# Patient Record
Sex: Female | Born: 1940 | Race: White | Hispanic: No | Smoking: Former smoker
Health system: Southern US, Community
[De-identification: ages and names within clinical notes are randomized; demographics above are authoritative.]

## PROBLEM LIST (undated history)

## (undated) DIAGNOSIS — D649 Anemia, unspecified: Secondary | ICD-10-CM

## (undated) DIAGNOSIS — J449 Chronic obstructive pulmonary disease, unspecified: Secondary | ICD-10-CM

## (undated) DIAGNOSIS — M1990 Unspecified osteoarthritis, unspecified site: Secondary | ICD-10-CM

## (undated) DIAGNOSIS — I340 Nonrheumatic mitral (valve) insufficiency: Secondary | ICD-10-CM

## (undated) DIAGNOSIS — K219 Gastro-esophageal reflux disease without esophagitis: Secondary | ICD-10-CM

## (undated) DIAGNOSIS — R011 Cardiac murmur, unspecified: Secondary | ICD-10-CM

## (undated) DIAGNOSIS — J189 Pneumonia, unspecified organism: Secondary | ICD-10-CM

## (undated) DIAGNOSIS — I429 Cardiomyopathy, unspecified: Secondary | ICD-10-CM

## (undated) DIAGNOSIS — I10 Essential (primary) hypertension: Secondary | ICD-10-CM

## (undated) DIAGNOSIS — E039 Hypothyroidism, unspecified: Secondary | ICD-10-CM

## (undated) DIAGNOSIS — E785 Hyperlipidemia, unspecified: Secondary | ICD-10-CM

## (undated) HISTORY — PX: TONSILLECTOMY: SUR1361

## (undated) HISTORY — PX: APPENDECTOMY: SHX54

## (undated) HISTORY — PX: BREAST BIOPSY: SHX20

---

## 2004-02-09 ENCOUNTER — Emergency Department: Payer: Self-pay | Admitting: Emergency Medicine

## 2004-02-09 ENCOUNTER — Other Ambulatory Visit: Payer: Self-pay

## 2004-02-09 ENCOUNTER — Inpatient Hospital Stay: Payer: Self-pay | Admitting: Internal Medicine

## 2005-07-06 IMAGING — CT CT CHEST W/ CM
1 of 2 series · 16 of 32 positions shown, 20 images · IV contrast (APPLIED)
Comparison: none

REASON FOR EXAM: intubated,unresponsvie  cardiac
COMMENTS:

[Series 7: inspace · axial · 0.67mm/px · z∈[-320,-57]mm · 16 of 413 slices shown, 20 images]
[im 19/413  mediastinal]
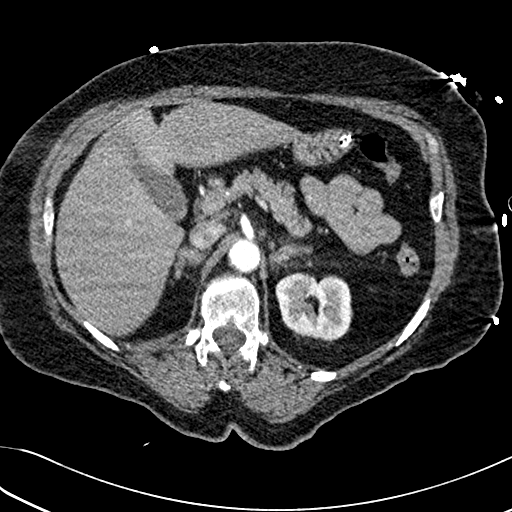
[im 19/413  lung]
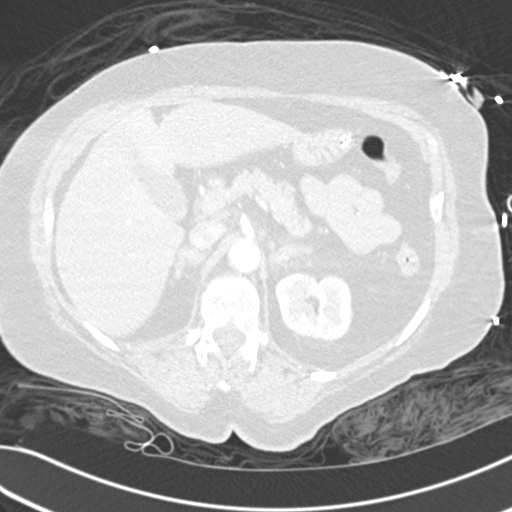
[im 57/413  lung]
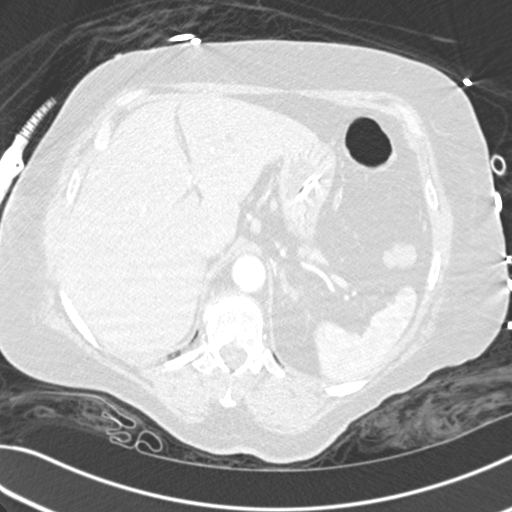
[im 75/413  lung]
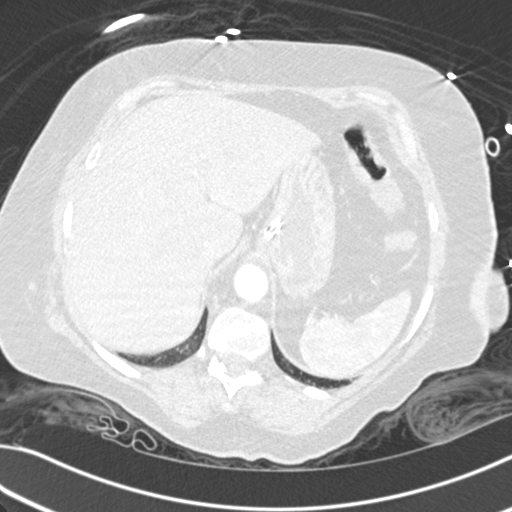
[im 104/413  lung]
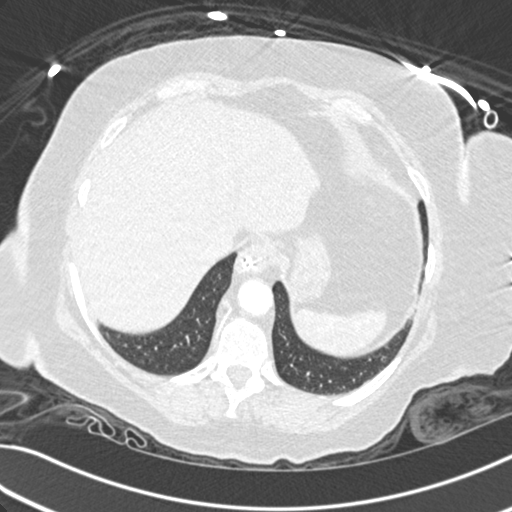
[im 113/413  mediastinal]
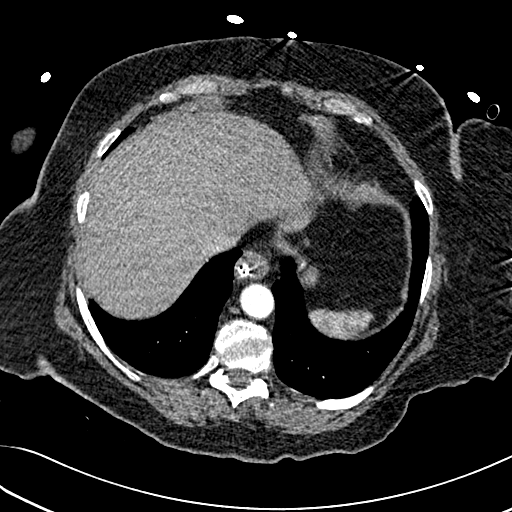
[im 113/413  lung]
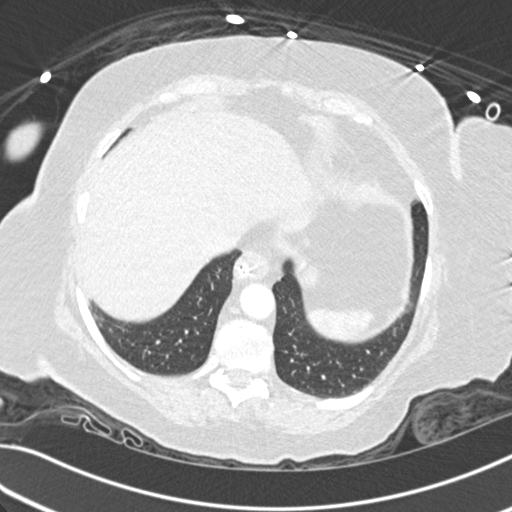
[im 150/413  lung]
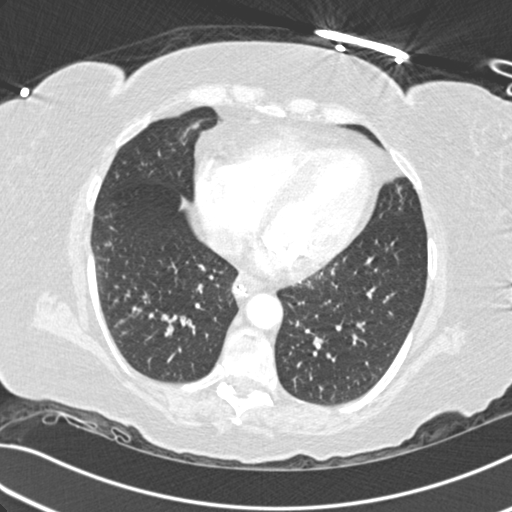
[im 169/413  lung]
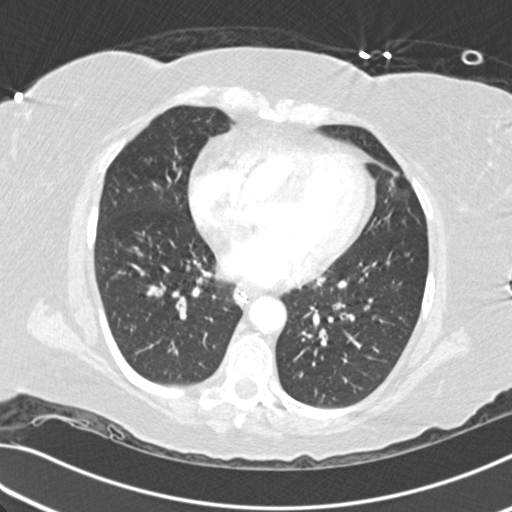
[im 193/413  lung]
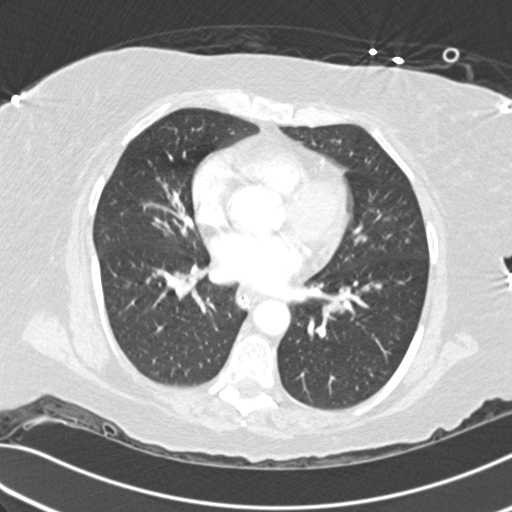
[im 207/413  mediastinal]
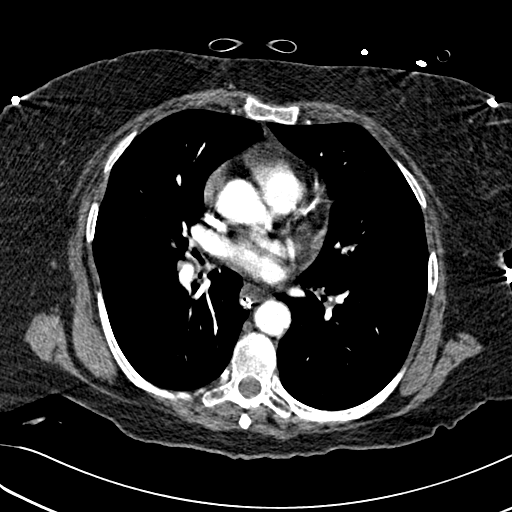
[im 207/413  lung]
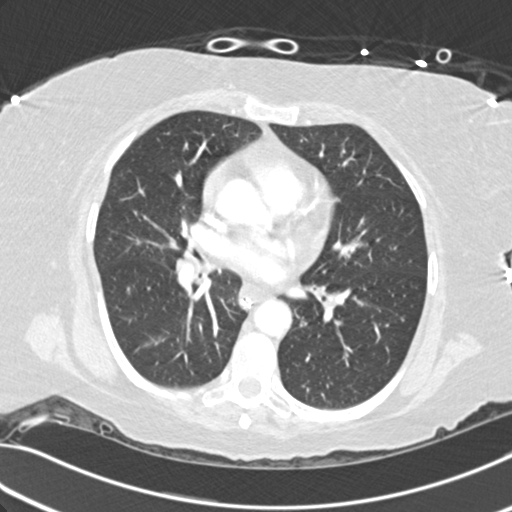
[im 244/413  lung]
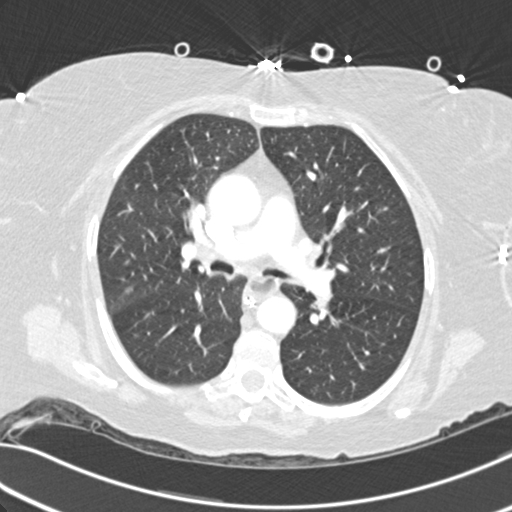
[im 263/413  lung]
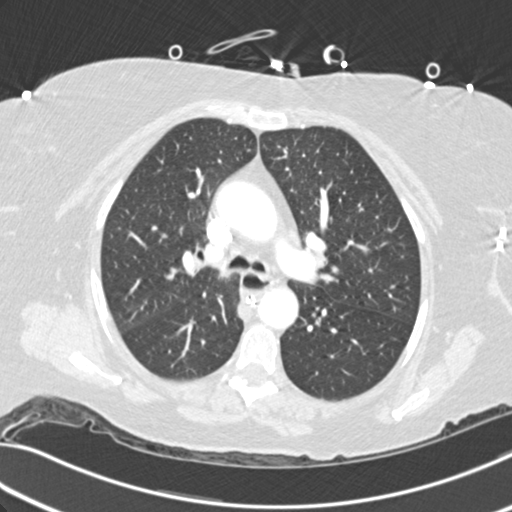
[im 300/413  lung]
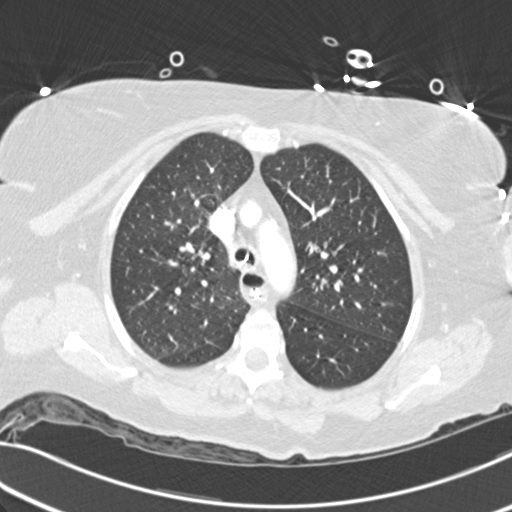
[im 310/413  mediastinal]
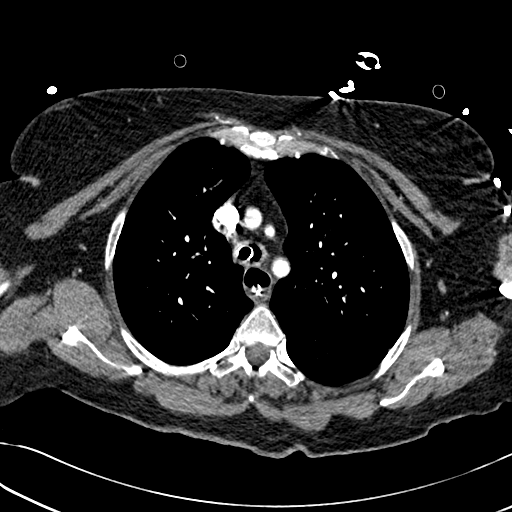
[im 310/413  lung]
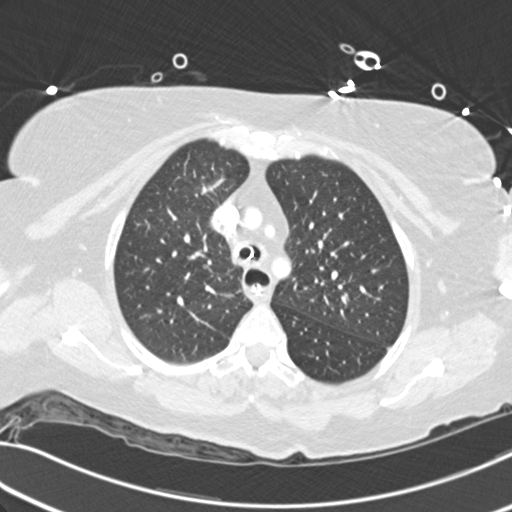
[im 338/413  lung]
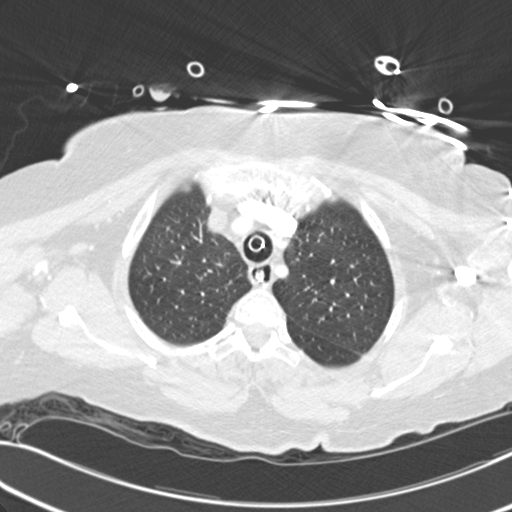
[im 356/413  lung]
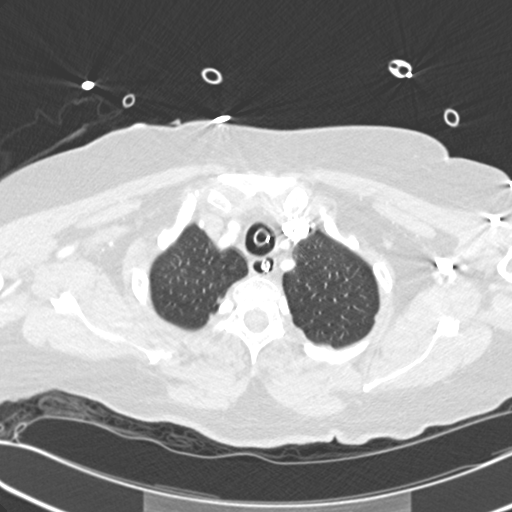
[im 394/413  lung]
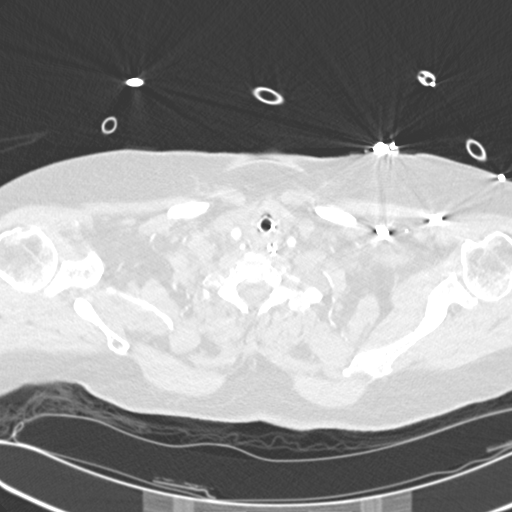

[16 of 32 positions shown; findings below may reference images not displayed]

PROCEDURE:     CT  - CT CHEST (FOR PE) W  - February 09, 2004  [DATE]

RESULT:     Standard IV contrast enhanced chest CT is obtained.

Endotracheal tube and NG tube is noted in good anatomic position. The
mediastinum and hilar structures are normal. The pulmonary arteries are
normal. The heart size is normal. Subsegmental atelectasis is noted in the
lung bases.
IMPRESSION: 1.     No evidence of pulmonary embolus.
2.     Endotracheal tube and NG tube noted in good anatomic position.
3.     No focal significant pulmonary parenchymal abnormalities are
identified.
4.     Subsegmental atelectasis noted in the lung bases.

## 2020-07-07 ENCOUNTER — Ambulatory Visit: Payer: Self-pay | Admitting: Student

## 2020-07-31 NOTE — Patient Instructions (Signed)
DUE TO COVID-19 ONLY ONE VISITOR IS ALLOWED TO COME WITH YOU AND STAY IN THE WAITING ROOM ONLY DURING PRE OP AND PROCEDURE DAY OF SURGERY. THE 1 VISITOR  MAY VISIT WITH YOU AFTER SURGERY IN YOUR PRIVATE ROOM DURING VISITING HOURS ONLY!  YOU NEED TO HAVE A COVID 19 TEST ON: 08/10/20, THIS TEST MUST BE DONE BEFORE SURGERY,  COVID TESTING SITE: [address]. Phone: [phone]. No appointment needed. Open M-F 8 am - 3 pm               ARLITA BUFFKIN   Your procedure is scheduled on: 08/13/20   Report to [facility] Main  Entrance   Report to short stay at : 5:15 AM     Call this number if you have problems the morning of surgery [phone]    Remember:NO SOLID FOOD AFTER MIDNIGHT THE NIGHT PRIOR TO SURGERY. NOTHING BY MOUTH EXCEPT CLEAR LIQUIDS UNTIL : 4:30 AM. PLEASE FINISH ENSURE DRINK PER SURGEON ORDER  WHICH NEEDS TO BE COMPLETED AT : 4:30 AM.   CLEAR LIQUID DIET  Foods Allowed                                                                     Foods Excluded  Coffee and tea, regular and decaf                             liquids that you cannot  Plain Jell-O any favor except red or purple                                           see through such as: Fruit ices (not with fruit pulp)                                     milk, soups, orange juice  Iced Popsicles                                    All solid food Carbonated beverages, regular and diet                                    Cranberry, grape and apple juices Sports drinks like Gatorade Lightly seasoned clear broth or consume(fat free) Sugar, honey syrup  Sample Menu Breakfast                                Lunch                                     Supper Cranberry juice                    Beef  broth                            Chicken broth Jell-O                                     Grape juice                           Apple juice Coffee or tea                        Jell-O                                       Popsicle                                                Coffee or tea                        Coffee or tea  _____________________________________________________________________   BRUSH YOUR TEETH MORNING OF SURGERY AND RINSE YOUR MOUTH OUT, NO CHEWING GUM CANDY OR MINTS.    Take these medicines the morning of surgery with A SIP OF WATER: cetirizine,synthroid. Use inhalers as needed.                               You may not have any metal on your body including hair pins and              piercings  Do not wear jewelry, make-up, lotions, powders or perfumes, deodorant             Do not wear nail polish on your fingernails.  Do not shave  48 hours prior to surgery.    Do not bring valuables to the hospital. [facility] IS NOT             RESPONSIBLE   FOR VALUABLES.  Contacts, dentures or bridgework may not be worn into surgery.  Leave suitcase in the car. After surgery it may be brought to your room.     Patients discharged the day of surgery will not be allowed to drive home. IF YOU ARE HAVING SURGERY AND GOING HOME THE SAME DAY, YOU MUST HAVE AN ADULT TO DRIVE YOU HOME AND BE WITH YOU FOR 24 HOURS. YOU MAY GO HOME BY TAXI OR UBER OR ORTHERWISE, BUT AN ADULT MUST ACCOMPANY YOU HOME AND STAY WITH YOU FOR 24 HOURS.  Name and phone number of your driver:  Special Instructions: N/A              Please read over the following fact sheets you were given: _____________________________________________________________________           [facility] - Preparing for Surgery Before surgery, you can play an important role.  Because skin is not sterile, your skin needs to be as free of germs as possible.  You can reduce the number of germs on your skin by washing with CHG (chlorahexidine gluconate) soap before surgery.  CHG is an antiseptic cleaner which kills germs and bonds with the skin to continue killing germs even after washing. Please DO NOT use if you have an allergy to CHG  or antibacterial soaps.  If your skin becomes reddened/irritated stop using the CHG and inform your nurse when you arrive at Short Stay. Do not shave (including legs and underarms) for at least 48 hours prior to the first CHG shower.  You may shave your face/neck. Please follow these instructions carefully:  1.  Shower with CHG Soap the night before surgery and the  morning of Surgery.  2.  If you choose to wash your hair, wash your hair first as usual with your  normal  shampoo.  3.  After you shampoo, rinse your hair and body thoroughly to remove the  shampoo.                           4.  Use CHG as you would any other liquid soap.  You can apply chg directly  to the skin and wash                       Gently with a scrungie or clean washcloth.  5.  Apply the CHG Soap to your body ONLY FROM THE NECK DOWN.   Do not use on face/ open                           Wound or open sores. Avoid contact with eyes, ears mouth and genitals (private parts).                       Wash face,  Genitals (private parts) with your normal soap.             6.  Wash thoroughly, paying special attention to the area where your surgery  will be performed.  7.  Thoroughly rinse your body with warm water from the neck down.  8.  DO NOT shower/wash with your normal soap after using and rinsing off  the CHG Soap.                9.  Pat yourself dry with a clean towel.            10.  Wear clean pajamas.            11.  Place clean sheets on your bed the night of your first shower and do not  sleep with pets. Day of Surgery : Do not apply any lotions/deodorants the morning of surgery.  Please wear clean clothes to the hospital/surgery center.  FAILURE TO FOLLOW THESE INSTRUCTIONS MAY RESULT IN THE CANCELLATION OF YOUR SURGERY PATIENT SIGNATURE_________________________________  NURSE SIGNATURE__________________________________  ________________________________________________________________________   Rogelia Mire  An incentive spirometer is a tool that can help keep your lungs clear and active. This tool measures how well you are filling your lungs with each breath. Taking long deep breaths may help reverse or decrease the chance of developing breathing (pulmonary) problems (especially infection) following: A long period of time when you are unable to move or be active. BEFORE THE PROCEDURE  If the spirometer includes an indicator to show your best effort, your nurse or respiratory therapist will set it to a desired goal. If possible, sit up straight or lean slightly forward. Try not to slouch. Hold the incentive  spirometer in an upright position. INSTRUCTIONS FOR USE  Sit on the edge of your bed if possible, or sit up as far as you can in bed or on a chair. Hold the incentive spirometer in an upright position. Breathe out normally. Place the mouthpiece in your mouth and seal your lips tightly around it. Breathe in slowly and as deeply as possible, raising the piston or the ball toward the top of the column. Hold your breath for 3-5 seconds or for as long as possible. Allow the piston or ball to fall to the bottom of the column. Remove the mouthpiece from your mouth and breathe out normally. Rest for a few seconds and repeat Steps 1 through 7 at least 10 times every 1-2 hours when you are awake. Take your time and take a few normal breaths between deep breaths. The spirometer may include an indicator to show your best effort. Use the indicator as a goal to work toward during each repetition. After each set of 10 deep breaths, practice coughing to be sure your lungs are clear. If you have an incision (the cut made at the time of surgery), support your incision when coughing by placing a pillow or rolled up towels firmly against it. Once you are able to get out of bed, walk around indoors and cough well. You may stop using the incentive spirometer when instructed by your caregiver.  RISKS AND  COMPLICATIONS Take your time so you do not get dizzy or light-headed. If you are in pain, you may need to take or ask for pain medication before doing incentive spirometry. It is harder to take a deep breath if you are having pain. AFTER USE Rest and breathe slowly and easily. It can be helpful to keep track of a log of your progress. Your caregiver can provide you with a simple table to help with this. If you are using the spirometer at home, follow these instructions: SEEK MEDICAL CARE IF:  You are having difficultly using the spirometer. You have trouble using the spirometer as often as instructed. Your pain medication is not giving enough relief while using the spirometer. You develop fever of 100.5 F (38.1 C) or higher. SEEK IMMEDIATE MEDICAL CARE IF:  You cough up bloody sputum that had not been present before. You develop fever of 102 F (38.9 C) or greater. You develop worsening pain at or near the incision site. MAKE SURE YOU:  Understand these instructions. Will watch your condition. Will get help right away if you are not doing well or get worse. Document Released: 05/02/2006 Document Revised: 03/14/2011 Document Reviewed: 07/03/2006 [facility] Patient Information 2014 [place], [place].   ________________________________________________________________________

## 2020-08-04 ENCOUNTER — Encounter (HOSPITAL_COMMUNITY)
Admission: RE | Admit: 2020-08-04 | Discharge: 2020-08-04 | Disposition: A | Discharge status: Home / Self Care | Payer: Medicare PPO | Source: Ambulatory Visit | Attending: Anesthesiology | Admitting: Anesthesiology

## 2020-08-13 ENCOUNTER — Ambulatory Visit (HOSPITAL_COMMUNITY): Admission: RE | Admit: 2020-08-13 | Payer: Medicare PPO | Source: Home / Self Care | Admitting: Orthopedic Surgery

## 2020-08-13 ENCOUNTER — Encounter (HOSPITAL_COMMUNITY): Admission: RE | Payer: Self-pay | Source: Home / Self Care

## 2020-08-13 SURGERY — ARTHROPLASTY, HIP, TOTAL, ANTERIOR APPROACH
Anesthesia: Spinal | Site: Hip | Laterality: Left

## 2020-11-02 ENCOUNTER — Ambulatory Visit: Payer: Self-pay | Admitting: Student

## 2020-11-02 DIAGNOSIS — M1612 Unilateral primary osteoarthritis, left hip: Secondary | ICD-10-CM

## 2020-11-17 ENCOUNTER — Ambulatory Visit: Payer: Self-pay | Admitting: Student

## 2020-11-17 ENCOUNTER — Encounter (HOSPITAL_COMMUNITY): Payer: Self-pay

## 2020-11-17 NOTE — Progress Notes (Addendum)
COVID swab appointment:  12-01-20 at [facility]  COVID Vaccine Completed:  Yes x2 Date COVID Vaccine completed:  01-28-19 02-18-19 Has received booster: Yes x2 10-24-19 10-06-20 COVID vaccine manufacturer: Pfizer      Date of COVID positive in last 90 days: No  PCP - Sylvan Cheese, MD  in [place] (notes requested) Cardiologist - Cletis Athens, MD (notes [facility])  Cardiac clearance in [facility] in note dated 09-09-20 by Dr. Arcola Jansky (on chart)    Medical Clearance on chart dated 10-28-20 by Dr. Sylvan Cheese  Chest x-ray - at Dr. Yetta Barre office  EKG - 07-01-20 [facility].  On chart Stress Test - N/A ECHO - 08-24-20 [facility] & on chart Cardiac Cath - N/A Pacemaker/ICD device last checked: Spinal Cord Stimulator:  Sleep Study - N/A CPAP -   Fasting Blood Sugar - N/A Checks Blood Sugar _____ times a day  Blood Thinner Instructions: N/A Aspirin Instructions: Last Dose:  Activity level:  Can go up a flight of stairs and perform activities of daily living without stopping and without symptoms of chest pain or shortness of breath.    Anesthesia review:  Cardiomyopathy, mitral valve insufficiency, murmur, HTN, COPD  BUN 59 and creatinine 1.65 on PAT labs  Patient denies shortness of breath, fever, cough and chest pain at PAT appointment   Patient verbalized understanding of instructions that were given to them at the PAT appointment. Patient was also instructed that they will need to review over the PAT instructions again at home before surgery.

## 2020-11-17 NOTE — H&P (Signed)
TOTAL HIP ADMISSION H&P  Patient is admitted for left total hip arthroplasty.  Subjective:  Chief Complaint: left hip pain  HPI: Eileen Wright, 80 y.o. female, has a history of pain and functional disability in the left hip(s) due to arthritis and patient has failed non-surgical conservative treatments for greater than 12 weeks to include NSAID's and/or analgesics and activity modification.  Onset of symptoms was gradual starting 3 years ago with gradually worsening course since that time.The patient noted no past surgery on the left hip(s).  Patient currently rates pain in the left hip at 9 out of 10 with activity. Patient has worsening of pain with activity and weight bearing, pain that interfers with activities of daily living, and pain with passive range of motion. Patient has evidence of subchondral cysts, subchondral sclerosis, and joint space narrowing by imaging studies. This condition presents safety issues increasing the risk of falls. There is no current active infection.  There are no problems to display for this patient.  Past Medical History:  Diagnosis Date   Anemia    Arthritis    Cardiomyopathy ([place])    COPD (chronic obstructive pulmonary disease) (HCC)    GERD (gastroesophageal reflux disease)    Heart murmur    Hyperlipemia    Hypertension    Hypothyroidism    Mitral valve insufficiency    Pneumonia     Past Surgical History:  Procedure Laterality Date   APPENDECTOMY     BREAST BIOPSY     TONSILLECTOMY     adenoids    No current facility-administered medications for this visit.   No current outpatient medications on file.   Facility-Administered Medications Ordered in Other Visits  Medication Dose Route Frequency Provider Last Rate Last Admin   0.9 %  sodium chloride infusion   Intravenous Continuous Cherlynn June B, PA       acetaminophen (OFIRMEV) IV 1,000 mg  1,000 mg Intravenous Once Yomira Flitton B, PA       acetaminophen (TYLENOL) tablet 1,000 mg   1,000 mg Oral Once Duane Boston, MD       ceFAZolin (ANCEF) IVPB 2g/100 mL premix  2 g Intravenous On Call to OR Cherlynn June B, PA       lactated ringers infusion   Intravenous Continuous Nolon Nations, MD 10 mL/hr at 12/03/20 0626 New Bag at 12/03/20 0626   povidone-iodine 10 % swab 2 application  2 application Topical Once Kiev Labrosse B, PA       tranexamic acid (CYKLOKAPRON) IVPB 1,000 mg  1,000 mg Intravenous To OR Cherlynn June B, PA       Allergies  Allergen Reactions   Epinephrine Hives   Vancomycin Hives   Penicillins     Redness around injection site    Social History   Tobacco Use   Smoking status: Former    Years: 1.00    Types: Cigarettes   Smokeless tobacco: Never  Substance Use Topics   Alcohol use: Never    No family history on file.   Review of Systems  Musculoskeletal:  Positive for arthralgias.  All other systems reviewed and are negative.  Objective:  Physical Exam HENT:     Head: Normocephalic.  Eyes:     Pupils: Pupils are equal, round, and reactive to light.  Cardiovascular:     Rate and Rhythm: Normal rate.  Pulmonary:     Effort: Pulmonary effort is normal.  Abdominal:     Palpations: Abdomen is soft.  Genitourinary:  Comments: Deferred Musculoskeletal:     Cervical back: Normal range of motion.     Comments: Painful ROM L hip  Skin:    General: Skin is warm.  Neurological:     Mental Status: She is alert and oriented to person, place, and time.  Psychiatric:        Behavior: Behavior normal.    Vital signs in last 24 hours: @VSRANGES @  Labs:   Estimated body mass index is 29.93 kg/m as calculated from the following:   Height as of 12/03/20: 5\' 1"  (1.549 m).   Weight as of 12/03/20: 71.9 kg.   Imaging Review Plain radiographs demonstrate severe degenerative joint disease of the left hip(s). The bone quality appears to be adequate for age and reported activity level.      Assessment/Plan:  End stage  arthritis, left hip(s)  The patient history, physical examination, clinical judgement of the provider and imaging studies are consistent with end stage degenerative joint disease of the left hip(s) and total hip arthroplasty is deemed medically necessary. The treatment options including medical management, injection therapy, arthroscopy and arthroplasty were discussed at length. The risks and benefits of total hip arthroplasty were presented and reviewed. The risks due to aseptic loosening, infection, stiffness, dislocation/subluxation,  thromboembolic complications and other imponderables were discussed.  The patient acknowledged the explanation, agreed to proceed with the plan and consent was signed. Patient is being admitted for inpatient treatment for surgery, pain control, PT, OT, prophylactic antibiotics, VTE prophylaxis, progressive ambulation and ADL's and discharge planning.The patient is planning to be discharged  home after overnight observation.

## 2020-11-17 NOTE — Patient Instructions (Addendum)
DUE TO COVID-19 ONLY ONE VISITOR IS ALLOWED TO COME WITH YOU AND STAY IN THE WAITING ROOM ONLY DURING PRE OP AND PROCEDURE.   **NO VISITORS ARE ALLOWED IN THE SHORT STAY AREA OR RECOVERY ROOM!!**  IF YOU WILL BE ADMITTED INTO THE HOSPITAL YOU ARE ALLOWED ONLY TWO SUPPORT PEOPLE DURING VISITATION HOURS ONLY (7 AM -8PM)    Up to two visitors ages 41+ are allowed at one time in a patient's room.  The visitors may rotate out with other people throughout the day.  Additionally, up to two children between the ages of 36 and 87 are allowed and do not count toward the number of allowed visitors.  Children within this age range must be accompanied by an adult visitor.  One adult visitor may remain with the patient overnight and must be in the room by 8 PM.  COVID SWAB TESTING MUST BE COMPLETED ON:  Tuesday, 12-01-20 at 8:05  **MUST PRESENT COMPLETED FORM AT TESTING SITE**    [address] [place], [place].  [facility] [address] Preadmit Testing  You are not required to quarantine, however you are required to wear a well-fitted mask when you are out and around people not in your household.  Hand Hygiene often Do NOT share personal items Notify your provider if you are in close contact with someone who has COVID or you develop fever 100.4 or greater, new onset of sneezing, cough, sore throat, shortness of breath or body aches.        Your procedure is scheduled on:  Thursday, 12-03-20   Report to [facility] Main  Entrance     Report to admitting at 5:15 AM   Call this number if you have problems the morning of surgery [phone]   Do not eat food :After Midnight.   May have liquids until 4:30 AM day of surgery  CLEAR LIQUID DIET  Foods Allowed                                                                     Foods Excluded  Water, Black Coffee (no milk/no creamer) and tea, regular and decaf                              liquids that you cannot  Plain Jell-O in  any flavor  (No red)                         see through such as: Fruit ices (not with fruit pulp)                                 milk, soups, orange juice  Iced Popsicles (No red)                                    All solid food                             Apple juices Sports  drinks like Gatorade (No red) Lightly seasoned clear broth or consume(fat free) Sugar    Complete one Ensure drink the morning of surgery at 4:30 AM the day of surgery.     The day of surgery:  Drink ONE (1) Pre-Surgery Clear Ensure the morning of surgery. Drink in one sitting. Do not sip.  This drink was given to you during your hospital  pre-op appointment visit. Nothing else to drink after completing the Pre-Surgery Clear Ensure.          If you have questions, please contact your surgeon's office.     Oral Hygiene is also important to reduce your risk of infection.                                    Remember - BRUSH YOUR TEETH THE MORNING OF SURGERY WITH YOUR REGULAR TOOTHPASTE   Do NOT smoke after Midnight   Take these medicines the morning of surgery with A SIP OF WATER:  Atorvastatin, Zyrtec, Synthroid.  Okay to use inhalers and nebulizers   Stop all vitamins and herbal supplements a week before surgery              You may not have any metal on your body including hair pins, jewelry, and body piercing             Do not wear make-up, lotions, powders, perfumes or deodorant  Do not wear nail polish including gel and S&S, artificial/acrylic nails, or any other type of covering on natural nails including finger and toenails. If you have artificial nails, gel coating, etc. that needs to be removed by a nail salon please have this removed prior to surgery or surgery may need to be canceled/ delayed if the surgeon/ anesthesia feels like they are unable to be safely monitored.   Do not shave  48 hours prior to surgery.   Do not bring valuables to the hospital. [facility] IS NOT RESPONSIBLE FOR  VALUABLES.   Contacts, dentures or bridgework may not be worn into surgery.   Bring small overnight bag day of surgery.   Please read over the following fact sheets you were given: IF YOU HAVE QUESTIONS ABOUT YOUR PRE OP INSTRUCTIONS PLEASE CALL [phone] [facility] - Preparing for Surgery Before surgery, you can play an important role.  Because skin is not sterile, your skin needs to be as free of germs as possible.  You can reduce the number of germs on your skin by washing with CHG (chlorahexidine gluconate) soap before surgery.  CHG is an antiseptic cleaner which kills germs and bonds with the skin to continue killing germs even after washing. Please DO NOT use if you have an allergy to CHG or antibacterial soaps.  If your skin becomes reddened/irritated stop using the CHG and inform your nurse when you arrive at Short Stay. Do not shave (including legs and underarms) for at least 48 hours prior to the first CHG shower.  You may shave your face/neck.  Please follow these instructions carefully:  1.  Shower with CHG Soap the night before surgery and the  morning of surgery.  2.  If you choose to wash your hair, wash your hair first as usual with your normal  shampoo.  3.  After you shampoo, rinse your hair and body thoroughly to remove the shampoo.  4.  Use CHG as you would any other liquid soap.  You can apply chg directly to the skin and wash.  Gently with a scrungie or clean washcloth.  5.  Apply the CHG Soap to your body ONLY FROM THE NECK DOWN.   Do   not use on face/ open                           Wound or open sores. Avoid contact with eyes, ears mouth and   genitals (private parts).                       Wash face,  Genitals (private parts) with your normal soap.             6.  Wash thoroughly, paying special attention to the area where your    surgery  will be performed.  7.  Thoroughly rinse your body with warm water from the neck down.  8.   DO NOT shower/wash with your normal soap after using and rinsing off the CHG Soap.                9.  Pat yourself dry with a clean towel.            10.  Wear clean pajamas.            11.  Place clean sheets on your bed the night of your first shower and do not  sleep with pets. Day of Surgery : Do not apply any lotions/deodorants the morning of surgery.  Please wear clean clothes to the hospital/surgery center.  FAILURE TO FOLLOW THESE INSTRUCTIONS MAY RESULT IN THE CANCELLATION OF YOUR SURGERY  PATIENT SIGNATURE_________________________________  NURSE SIGNATURE__________________________________  ________________________________________________________________________   Eileen Wright  An incentive spirometer is a tool that can help keep your lungs clear and active. This tool measures how well you are filling your lungs with each breath. Taking long deep breaths may help reverse or decrease the chance of developing breathing (pulmonary) problems (especially infection) following: A long period of time when you are unable to move or be active. BEFORE THE PROCEDURE  If the spirometer includes an indicator to show your best effort, your nurse or respiratory therapist will set it to a desired goal. If possible, sit up straight or lean slightly forward. Try not to slouch. Hold the incentive spirometer in an upright position. INSTRUCTIONS FOR USE  Sit on the edge of your bed if possible, or sit up as far as you can in bed or on a chair. Hold the incentive spirometer in an upright position. Breathe out normally. Place the mouthpiece in your mouth and seal your lips tightly around it. Breathe in slowly and as deeply as possible, raising the piston or the ball toward the top of the column. Hold your breath for 3-5 seconds or for as long as possible. Allow the piston or ball to fall to the bottom of the column. Remove the mouthpiece from your mouth and breathe out normally. Rest for a  few seconds and repeat Steps 1 through 7 at least 10 times every 1-2 hours when you are awake. Take your time and take a few normal breaths between deep breaths. The spirometer may include an indicator to show your best effort. Use the indicator as a goal to work toward during each repetition. After each set of 10 deep breaths, practice coughing to be sure  your lungs are clear. If you have an incision (the cut made at the time of surgery), support your incision when coughing by placing a pillow or rolled up towels firmly against it. Once you are able to get out of bed, walk around indoors and cough well. You may stop using the incentive spirometer when instructed by your caregiver.  RISKS AND COMPLICATIONS Take your time so you do not get dizzy or light-headed. If you are in pain, you may need to take or ask for pain medication before doing incentive spirometry. It is harder to take a deep breath if you are having pain. AFTER USE Rest and breathe slowly and easily. It can be helpful to keep track of a log of your progress. Your caregiver can provide you with a simple table to help with this. If you are using the spirometer at home, follow these instructions: [place] IF:  You are having difficultly using the spirometer. You have trouble using the spirometer as often as instructed. Your pain medication is not giving enough relief while using the spirometer. You develop fever of 100.5 F (38.1 C) or higher. SEEK IMMEDIATE MEDICAL CARE IF:  You cough up bloody sputum that had not been present before. You develop fever of 102 F (38.9 C) or greater. You develop worsening pain at or near the incision site. MAKE SURE YOU:  Understand these instructions. Will watch your condition. Will get help right away if you are not doing well or get worse. Document Released: 05/02/2006 Document Revised: 03/14/2011 Document Reviewed: 07/03/2006 Exit[employer] Patient Information 2014 [employer],  [place].   ________________________________________________________________________  WHAT IS A BLOOD TRANSFUSION? Blood Transfusion Information  A transfusion is the replacement of blood or some of its parts. Blood is made up of multiple cells which provide different functions. Red blood cells carry oxygen and are used for blood loss replacement. White blood cells fight against infection. Platelets control bleeding. Plasma helps clot blood. Other blood products are available for specialized needs, such as hemophilia or other clotting disorders. BEFORE THE TRANSFUSION  Who gives blood for transfusions?  Healthy volunteers who are fully evaluated to make sure their blood is safe. This is blood bank blood. Transfusion therapy is the safest it has ever been in the practice of medicine. Before blood is taken from a donor, a complete history is taken to make sure that person has no history of diseases nor engages in risky social behavior (examples are intravenous drug use or sexual activity with multiple partners). The donor's travel history is screened to minimize risk of transmitting infections, such as malaria. The donated blood is tested for signs of infectious diseases, such as HIV and hepatitis. The blood is then tested to be sure it is compatible with you in order to minimize the chance of a transfusion reaction. If you or a relative donates blood, this is often done in anticipation of surgery and is not appropriate for emergency situations. It takes many days to process the donated blood. RISKS AND COMPLICATIONS Although transfusion therapy is very safe and saves many lives, the main dangers of transfusion include:  Getting an infectious disease. Developing a transfusion reaction. This is an allergic reaction to something in the blood you were given. Every precaution is taken to prevent this. The decision to have a blood transfusion has been considered carefully by your caregiver before blood is  given. Blood is not given unless the benefits outweigh the risks. AFTER THE TRANSFUSION Right after receiving a blood  transfusion, you will usually feel much better and more energetic. This is especially true if your red blood cells have gotten low (anemic). The transfusion raises the level of the red blood cells which carry oxygen, and this usually causes an energy increase. The nurse administering the transfusion will monitor you carefully for complications. HOME CARE INSTRUCTIONS  No special instructions are needed after a transfusion. You may find your energy is better. Speak with your caregiver about any limitations on activity for underlying diseases you may have. SEEK MEDICAL CARE IF:  Your condition is not improving after your transfusion. You develop redness or irritation at the intravenous (IV) site. SEEK IMMEDIATE MEDICAL CARE IF:  Any of the following symptoms occur over the next 12 hours: Shaking chills. You have a temperature by mouth above 102 F (38.9 C), not controlled by medicine. Chest, back, or muscle pain. People around you feel you are not acting correctly or are confused. Shortness of breath or difficulty breathing. Dizziness and fainting. You get a rash or develop hives. You have a decrease in urine output. Your urine turns a dark color or changes to pink, red, or brown. Any of the following symptoms occur over the next 10 days: You have a temperature by mouth above 102 F (38.9 C), not controlled by medicine. Shortness of breath. Weakness after normal activity. The white part of the eye turns yellow (jaundice). You have a decrease in the amount of urine or are urinating less often. Your urine turns a dark color or changes to pink, red, or brown. Document Released: 12/18/1999 Document Revised: 03/14/2011 Document Reviewed: 08/06/2007 [facility] Patient Information 2014 [place], [place].  _______________________________________________________________________

## 2020-11-17 NOTE — H&P (View-Only) (Signed)
TOTAL HIP ADMISSION H&P  Patient is admitted for left total hip arthroplasty.  Subjective:  Chief Complaint: left hip pain  HPI: Eileen Wright, 80 y.o. female, has a history of pain and functional disability in the left hip(s) due to arthritis and patient has failed non-surgical conservative treatments for greater than 12 weeks to include NSAID's and/or analgesics and activity modification.  Onset of symptoms was gradual starting 3 years ago with gradually worsening course since that time.The patient noted no past surgery on the left hip(s).  Patient currently rates pain in the left hip at 9 out of 10 with activity. Patient has worsening of pain with activity and weight bearing, pain that interfers with activities of daily living, and pain with passive range of motion. Patient has evidence of subchondral cysts, subchondral sclerosis, and joint space narrowing by imaging studies. This condition presents safety issues increasing the risk of falls. There is no current active infection.  There are no problems to display for this patient.  Past Medical History:  Diagnosis Date   Anemia    Arthritis    Cardiomyopathy ([place])    COPD (chronic obstructive pulmonary disease) (HCC)    GERD (gastroesophageal reflux disease)    Heart murmur    Hyperlipemia    Hypertension    Hypothyroidism    Mitral valve insufficiency    Pneumonia     Past Surgical History:  Procedure Laterality Date   APPENDECTOMY     BREAST BIOPSY     TONSILLECTOMY     adenoids    No current facility-administered medications for this visit.   No current outpatient medications on file.   Facility-Administered Medications Ordered in Other Visits  Medication Dose Route Frequency Provider Last Rate Last Admin   0.9 %  sodium chloride infusion   Intravenous Continuous Cherlynn June B, PA       acetaminophen (OFIRMEV) IV 1,000 mg  1,000 mg Intravenous Once Zissel Biederman B, PA       acetaminophen (TYLENOL) tablet 1,000 mg   1,000 mg Oral Once Duane Boston, MD       ceFAZolin (ANCEF) IVPB 2g/100 mL premix  2 g Intravenous On Call to OR Cherlynn June B, PA       lactated ringers infusion   Intravenous Continuous Nolon Nations, MD 10 mL/hr at 12/03/20 0626 New Bag at 12/03/20 0626   povidone-iodine 10 % swab 2 application  2 application Topical Once Aletheia Tangredi B, PA       tranexamic acid (CYKLOKAPRON) IVPB 1,000 mg  1,000 mg Intravenous To OR Cherlynn June B, PA       Allergies  Allergen Reactions   Epinephrine Hives   Vancomycin Hives   Penicillins     Redness around injection site    Social History   Tobacco Use   Smoking status: Former    Years: 1.00    Types: Cigarettes   Smokeless tobacco: Never  Substance Use Topics   Alcohol use: Never    No family history on file.   Review of Systems  Musculoskeletal:  Positive for arthralgias.  All other systems reviewed and are negative.  Objective:  Physical Exam HENT:     Head: Normocephalic.  Eyes:     Pupils: Pupils are equal, round, and reactive to light.  Cardiovascular:     Rate and Rhythm: Normal rate.  Pulmonary:     Effort: Pulmonary effort is normal.  Abdominal:     Palpations: Abdomen is soft.  Genitourinary:  Comments: Deferred Musculoskeletal:     Cervical back: Normal range of motion.     Comments: Painful ROM L hip  Skin:    General: Skin is warm.  Neurological:     Mental Status: She is alert and oriented to person, place, and time.  Psychiatric:        Behavior: Behavior normal.    Vital signs in last 24 hours: @VSRANGES @  Labs:   Estimated body mass index is 29.93 kg/m as calculated from the following:   Height as of 12/03/20: 5\' 1"  (1.549 m).   Weight as of 12/03/20: 71.9 kg.   Imaging Review Plain radiographs demonstrate severe degenerative joint disease of the left hip(s). The bone quality appears to be adequate for age and reported activity level.      Assessment/Plan:  End stage  arthritis, left hip(s)  The patient history, physical examination, clinical judgement of the provider and imaging studies are consistent with end stage degenerative joint disease of the left hip(s) and total hip arthroplasty is deemed medically necessary. The treatment options including medical management, injection therapy, arthroscopy and arthroplasty were discussed at length. The risks and benefits of total hip arthroplasty were presented and reviewed. The risks due to aseptic loosening, infection, stiffness, dislocation/subluxation,  thromboembolic complications and other imponderables were discussed.  The patient acknowledged the explanation, agreed to proceed with the plan and consent was signed. Patient is being admitted for inpatient treatment for surgery, pain control, PT, OT, prophylactic antibiotics, VTE prophylaxis, progressive ambulation and ADL's and discharge planning.The patient is planning to be discharged  home after overnight observation.

## 2020-11-23 ENCOUNTER — Encounter (HOSPITAL_COMMUNITY)
Admission: RE | Admit: 2020-11-23 | Discharge: 2020-11-23 | Disposition: A | Discharge status: Home / Self Care | Payer: Medicare PPO | Source: Ambulatory Visit | Attending: Orthopedic Surgery | Admitting: Orthopedic Surgery

## 2020-11-23 ENCOUNTER — Other Ambulatory Visit: Payer: Self-pay

## 2020-11-23 ENCOUNTER — Encounter (HOSPITAL_COMMUNITY): Payer: Self-pay | Admitting: *Deleted

## 2020-11-23 VITALS — BP 147/63 | HR 88 | Temp 97.9°F | Resp 18 | Ht 61.0 in | Wt 158.4 lb

## 2020-11-23 DIAGNOSIS — Z01818 Encounter for other preprocedural examination: Secondary | ICD-10-CM

## 2020-11-23 DIAGNOSIS — Z01812 Encounter for preprocedural laboratory examination: Secondary | ICD-10-CM | POA: Insufficient documentation

## 2020-11-23 DIAGNOSIS — M1612 Unilateral primary osteoarthritis, left hip: Secondary | ICD-10-CM

## 2020-11-23 HISTORY — DX: Unspecified osteoarthritis, unspecified site: M19.90

## 2020-11-23 HISTORY — DX: Gastro-esophageal reflux disease without esophagitis: K21.9

## 2020-11-23 HISTORY — DX: Cardiomyopathy, unspecified: I42.9

## 2020-11-23 HISTORY — DX: Pneumonia, unspecified organism: J18.9

## 2020-11-23 HISTORY — DX: Hyperlipidemia, unspecified: E78.5

## 2020-11-23 HISTORY — DX: Nonrheumatic mitral (valve) insufficiency: I34.0

## 2020-11-23 HISTORY — DX: Essential (primary) hypertension: I10

## 2020-11-23 HISTORY — DX: Hypothyroidism, unspecified: E03.9

## 2020-11-23 HISTORY — DX: Cardiac murmur, unspecified: R01.1

## 2020-11-23 HISTORY — DX: Chronic obstructive pulmonary disease, unspecified: J44.9

## 2020-11-23 HISTORY — DX: Anemia, unspecified: D64.9

## 2020-11-23 LAB — CBC
HCT: 32 % — ABNORMAL LOW (ref 36.0–46.0)
Hemoglobin: 10.3 g/dL — ABNORMAL LOW (ref 12.0–15.0)
MCH: 31.8 pg (ref 26.0–34.0)
MCHC: 32.2 g/dL (ref 30.0–36.0)
MCV: 98.8 fL (ref 80.0–100.0)
Platelets: 369 10*3/uL (ref 150–400)
RBC: 3.24 MIL/uL — ABNORMAL LOW (ref 3.87–5.11)
RDW: 13.1 % (ref 11.5–15.5)
WBC: 9.3 10*3/uL (ref 4.0–10.5)
nRBC: 0 % (ref 0.0–0.2)

## 2020-11-23 LAB — COMPREHENSIVE METABOLIC PANEL
ALT: 12 U/L (ref 0–44)
AST: 15 U/L (ref 15–41)
Albumin: 4.2 g/dL (ref 3.5–5.0)
Alkaline Phosphatase: 61 U/L (ref 38–126)
Anion gap: 7 (ref 5–15)
BUN: 59 mg/dL — ABNORMAL HIGH (ref 8–23)
CO2: 20 mmol/L — ABNORMAL LOW (ref 22–32)
Calcium: 9.8 mg/dL (ref 8.9–10.3)
Chloride: 113 mmol/L — ABNORMAL HIGH (ref 98–111)
Creatinine, Ser: 1.65 mg/dL — ABNORMAL HIGH (ref 0.44–1.00)
GFR, Estimated: 31 mL/min — ABNORMAL LOW (ref >60)
Glucose, Bld: 101 mg/dL — ABNORMAL HIGH (ref 70–99)
Potassium: 4.8 mmol/L (ref 3.5–5.1)
Sodium: 140 mmol/L (ref 135–145)
Total Bilirubin: 0.5 mg/dL (ref 0.3–1.2)
Total Protein: 7.1 g/dL (ref 6.5–8.1)

## 2020-11-23 LAB — PROTIME-INR
INR: 1 (ref 0.8–1.2)
Prothrombin Time: 13.4 seconds (ref 11.4–15.2)

## 2020-11-23 LAB — SURGICAL PCR SCREEN
MRSA, PCR: NEGATIVE
Staphylococcus aureus: NEGATIVE

## 2020-11-25 NOTE — Anesthesia Preprocedure Evaluation (Addendum)
Anesthesia Evaluation    Reviewed: Allergy & Precautions, Patient's Chart, lab work & pertinent test results, reviewed documented beta blocker date and time   History of Anesthesia Complications Negative for: history of anesthetic complications  Airway Mallampati: II  TM Distance: >3 FB Neck ROM: Full    Dental  (+) Poor Dentition, Dental Advisory Given   Pulmonary COPD,  COPD inhaler, former smoker,    Pulmonary exam normal        Cardiovascular hypertension, Pt. on medications and Pt. on home beta blockers Normal cardiovascular exam+ Valvular Problems/Murmurs MR   Pt last seen by cardiology 09/09/2020. Per OV note, "The patient has good control of risk factors and denies anginal symptoms. Patient is stable from a cardiac perspective and at acceptable risk for anticipated hip replacement surgery."  Echo 08/24/20 Summary  1. 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  2. The left ventricle is relatively small in size with moderately to  severely increased wall thickness.  3. The left ventricular systolic function is hyperdynamic, LVEF is visually  estimated at >70%.  4. Mitral annular calcification is present (severe).  5. The mitral valve leaflets are mildly thickened with normal leaflet  mobility.  6. There is moderate mitral valve regurgitation.  7. The aortic valve is trileaflet with mildly thickened leaflets with normal  excursion.  8. The left atrium is dilated in size.  9. The right ventricle is normal in size, with normal systolic function.    Neuro/Psych negative neurological ROS     GI/Hepatic negative GI ROS, Neg liver ROS,   Endo/Other  Hypothyroidism   Renal/GU Renal InsufficiencyRenal disease     Musculoskeletal negative musculoskeletal ROS (+)   Abdominal   Peds  Hematology negative hematology ROS (+) anemia ,   Anesthesia Other Findings    Reproductive/Obstetrics                           Anesthesia Physical Anesthesia Plan  ASA: 3  Anesthesia Plan: Spinal   Post-op Pain Management: Tylenol PO (pre-op)   Induction:   PONV Risk Score and Plan: 3 and Ondansetron, Propofol infusion and Dexamethasone  Airway Management Planned: Natural Airway  Additional Equipment:   Intra-op Plan:   Post-operative Plan:   Informed Consent: I have reviewed the patients History and Physical, chart, labs and discussed the procedure including the risks, benefits and alternatives for the proposed anesthesia with the patient or authorized representative who has indicated his/her understanding and acceptance.     Dental advisory given  Plan Discussed with: Anesthesiologist and CRNA  Anesthesia Plan Comments: (See PAT note 11/23/2020, Jodell Cipro Ward, PA-C)      Anesthesia Quick Evaluation

## 2020-11-25 NOTE — Progress Notes (Signed)
Anesthesia Chart Review   Case: [id number] Date/Time: 12/03/20 0715   Procedure: TOTAL HIP ARTHROPLASTY ANTERIOR APPROACH (Left: Hip)   Anesthesia type: Spinal   Pre-op diagnosis: Left hip osteoarthritis   Location: [facility] ROOM 08 / [facility]   Surgeons: Samson Frederic, MD       DISCUSSION:80 y.o. former smoker with h/o HTN, COPD, GERD, renal insufficiency, hypertrophic obstructive cardiomyopathy, mitral valve insufficiency, left hip OA scheduled for above procedure 12/03/20 with Dr. Samson Frederic.   Pt last seen by cardiology 09/09/2020. Per OV note, "The patient has good control of risk factors and denies anginal symptoms. Patient is stable from a cardiac perspective and at acceptable risk for anticipated hip replacement surgery."  Clearance from PCP on chart.   Anticipate pt can proceed with planned procedure barring acute status change.   VS: BP (!) 147/63   Pulse 88   Temp 36.6 C (Oral)   Resp 18   Ht 5\' 1"  (1.549 m)   Wt 71.8 kg   SpO2 100%   BMI 29.93 kg/m   PROVIDERS: , MD is PCP   Cardiologist is Nicholaus Bloom, MD  LABS:  creatinine stable, review PCP labs (all labs ordered are listed, but only abnormal results are displayed)  Labs Reviewed  CBC - Abnormal; Notable for the following components:      Result Value   RBC 3.24 (*)    Hemoglobin 10.3 (*)    HCT 32.0 (*)    All other components within normal limits  COMPREHENSIVE METABOLIC PANEL - Abnormal; Notable for the following components:   Chloride 113 (*)    CO2 20 (*)    Glucose, Bld 101 (*)    BUN 59 (*)    Creatinine, Ser 1.65 (*)    GFR, Estimated 31 (*)    All other components within normal limits  SURGICAL PCR SCREEN  PROTIME-INR  TYPE AND SCREEN     IMAGES:   EKG: On chart   CV: Echo 08/24/20 Summary    1. 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    2. The left ventricle is relatively small in size with moderately to  severely increased wall  thickness.    3. The left ventricular systolic function is hyperdynamic, LVEF is visually  estimated at >70%.    4. Mitral annular calcification is present (severe).    5. The mitral valve leaflets are mildly thickened with normal leaflet  mobility.    6. There is moderate mitral valve regurgitation.    7. The aortic valve is trileaflet with mildly thickened leaflets with normal  excursion.    8. The left atrium is dilated in size.    9. The right ventricle is normal in size, with normal systolic function.  Past Medical History:  Diagnosis Date   Anemia    Arthritis    Cardiomyopathy (HCC)    COPD (chronic obstructive pulmonary disease) (HCC)    GERD (gastroesophageal reflux disease)    Heart murmur    Hyperlipemia    Hypertension    Hypothyroidism    Mitral valve insufficiency    Pneumonia     Past Surgical History:  Procedure Laterality Date   APPENDECTOMY     BREAST BIOPSY     TONSILLECTOMY     adenoids    MEDICATIONS:  albuterol (VENTOLIN HFA) 108 (90 Base) MCG/ACT inhaler   atorvastatin (LIPITOR) 10 MG tablet   cetirizine (ZYRTEC) 10 MG tablet   Cholecalciferol (  VITAMIN D3) 125 MCG (5000 UT) CAPS   fluticasone-salmeterol (ADVAIR) 500-50 MCG/ACT AEPB   lisinopril-hydrochlorothiazide (ZESTORETIC) 10-12.5 MG tablet   magnesium oxide (MAG-OX) 400 (240 Mg) MG tablet   metoprolol succinate (TOPROL-XL) 25 MG 24 hr tablet   Multiple Vitamin (MULTI-VITAMINS) TABS   omeprazole (PRILOSEC) 20 MG capsule   SPIRIVA HANDIHALER 18 MCG inhalation capsule   spironolactone (ALDACTONE) 25 MG tablet   SYNTHROID 125 MCG tablet   SYNTHROID 137 MCG tablet   torsemide (DEMADEX) 20 MG tablet   No current facility-administered medications for this encounter.    Jodell Cipro Ward, PA-C [facility] [phone]

## 2020-12-01 ENCOUNTER — Other Ambulatory Visit: Payer: Self-pay

## 2020-12-01 ENCOUNTER — Other Ambulatory Visit
Admission: RE | Admit: 2020-12-01 | Discharge: 2020-12-01 | Disposition: A | Discharge status: Home / Self Care | Payer: Medicare PPO | Source: Ambulatory Visit | Attending: Orthopedic Surgery | Admitting: Orthopedic Surgery

## 2020-12-01 DIAGNOSIS — Z01812 Encounter for preprocedural laboratory examination: Secondary | ICD-10-CM | POA: Insufficient documentation

## 2020-12-01 DIAGNOSIS — Z20822 Contact with and (suspected) exposure to covid-19: Secondary | ICD-10-CM | POA: Diagnosis not present

## 2020-12-02 LAB — SARS CORONAVIRUS 2 (TAT 6-24 HRS): SARS Coronavirus 2: NEGATIVE

## 2020-12-03 ENCOUNTER — Ambulatory Visit (HOSPITAL_COMMUNITY): Payer: Medicare PPO

## 2020-12-03 ENCOUNTER — Other Ambulatory Visit: Payer: Self-pay

## 2020-12-03 ENCOUNTER — Ambulatory Visit (HOSPITAL_COMMUNITY): Payer: Medicare PPO | Admitting: Anesthesiology

## 2020-12-03 ENCOUNTER — Encounter (HOSPITAL_COMMUNITY)
Admission: RE | Disposition: A | Discharge status: Home Health | Payer: Self-pay | Source: Ambulatory Visit | Attending: Orthopedic Surgery

## 2020-12-03 ENCOUNTER — Ambulatory Visit (HOSPITAL_COMMUNITY): Payer: Medicare PPO | Admitting: Physician Assistant

## 2020-12-03 ENCOUNTER — Encounter (HOSPITAL_COMMUNITY): Payer: Self-pay | Admitting: Orthopedic Surgery

## 2020-12-03 ENCOUNTER — Inpatient Hospital Stay (HOSPITAL_COMMUNITY)
Admission: RE | Admit: 2020-12-03 | Discharge: 2020-12-07 | DRG: 470 | Disposition: A | Discharge status: Home Health | Payer: Medicare PPO | Source: Ambulatory Visit | Attending: Orthopedic Surgery | Admitting: Orthopedic Surgery

## 2020-12-03 DIAGNOSIS — Z880 Allergy status to penicillin: Secondary | ICD-10-CM

## 2020-12-03 DIAGNOSIS — Z79899 Other long term (current) drug therapy: Secondary | ICD-10-CM

## 2020-12-03 DIAGNOSIS — I429 Cardiomyopathy, unspecified: Secondary | ICD-10-CM | POA: Diagnosis present

## 2020-12-03 DIAGNOSIS — Z888 Allergy status to other drugs, medicaments and biological substances status: Secondary | ICD-10-CM

## 2020-12-03 DIAGNOSIS — Z419 Encounter for procedure for purposes other than remedying health state, unspecified: Secondary | ICD-10-CM

## 2020-12-03 DIAGNOSIS — K219 Gastro-esophageal reflux disease without esophagitis: Secondary | ICD-10-CM | POA: Diagnosis present

## 2020-12-03 DIAGNOSIS — Z9181 History of falling: Secondary | ICD-10-CM

## 2020-12-03 DIAGNOSIS — Z8701 Personal history of pneumonia (recurrent): Secondary | ICD-10-CM

## 2020-12-03 DIAGNOSIS — D62 Acute posthemorrhagic anemia: Principal | ICD-10-CM | POA: Diagnosis present

## 2020-12-03 DIAGNOSIS — Z09 Encounter for follow-up examination after completed treatment for conditions other than malignant neoplasm: Secondary | ICD-10-CM

## 2020-12-03 DIAGNOSIS — M1612 Unilateral primary osteoarthritis, left hip: Principal | ICD-10-CM | POA: Diagnosis present

## 2020-12-03 DIAGNOSIS — Z96641 Presence of right artificial hip joint: Secondary | ICD-10-CM | POA: Diagnosis present

## 2020-12-03 DIAGNOSIS — E039 Hypothyroidism, unspecified: Secondary | ICD-10-CM | POA: Diagnosis present

## 2020-12-03 DIAGNOSIS — Z20822 Contact with and (suspected) exposure to covid-19: Secondary | ICD-10-CM | POA: Diagnosis present

## 2020-12-03 DIAGNOSIS — E785 Hyperlipidemia, unspecified: Secondary | ICD-10-CM | POA: Diagnosis present

## 2020-12-03 DIAGNOSIS — E875 Hyperkalemia: Secondary | ICD-10-CM | POA: Diagnosis present

## 2020-12-03 DIAGNOSIS — Z87891 Personal history of nicotine dependence: Secondary | ICD-10-CM

## 2020-12-03 DIAGNOSIS — N1832 Chronic kidney disease, stage 3b: Secondary | ICD-10-CM | POA: Diagnosis present

## 2020-12-03 DIAGNOSIS — I129 Hypertensive chronic kidney disease with stage 1 through stage 4 chronic kidney disease, or unspecified chronic kidney disease: Secondary | ICD-10-CM | POA: Diagnosis present

## 2020-12-03 DIAGNOSIS — I340 Nonrheumatic mitral (valve) insufficiency: Secondary | ICD-10-CM | POA: Diagnosis present

## 2020-12-03 DIAGNOSIS — I10 Essential (primary) hypertension: Secondary | ICD-10-CM | POA: Diagnosis present

## 2020-12-03 DIAGNOSIS — J449 Chronic obstructive pulmonary disease, unspecified: Secondary | ICD-10-CM | POA: Diagnosis present

## 2020-12-03 DIAGNOSIS — Z881 Allergy status to other antibiotic agents status: Secondary | ICD-10-CM

## 2020-12-03 DIAGNOSIS — Z8711 Personal history of peptic ulcer disease: Secondary | ICD-10-CM

## 2020-12-03 HISTORY — PX: TOTAL HIP ARTHROPLASTY: SHX124

## 2020-12-03 LAB — ABO/RH: ABO/RH(D): A POS

## 2020-12-03 SURGERY — ARTHROPLASTY, HIP, TOTAL, ANTERIOR APPROACH
Anesthesia: Spinal | Site: Hip | Laterality: Left

## 2020-12-03 MED ORDER — ACETAMINOPHEN 10 MG/ML IV SOLN
1000.0000 mg | Freq: Once | INTRAVENOUS | Status: AC
  Administered 2020-12-03: 1000 mg via INTRAVENOUS
  Filled 2020-12-03: qty 100

## 2020-12-03 MED ORDER — DIPHENHYDRAMINE HCL 12.5 MG/5ML PO ELIX: 12.5000 mg | ORAL_SOLUTION | ORAL | Status: DC | PRN

## 2020-12-03 MED ORDER — ORAL CARE MOUTH RINSE: 15.0000 mL | Freq: Once | OROMUCOSAL | Status: AC

## 2020-12-03 MED ORDER — CELECOXIB 200 MG PO CAPS: 200.0000 mg | ORAL_CAPSULE | Freq: Two times a day (BID) | ORAL | Status: DC

## 2020-12-03 MED ORDER — ONDANSETRON HCL 4 MG PO TABS: 4.0000 mg | ORAL_TABLET | Freq: Four times a day (QID) | ORAL | Status: DC | PRN

## 2020-12-03 MED ORDER — ASPIRIN 81 MG PO CHEW
81.0000 mg | CHEWABLE_TABLET | Freq: Two times a day (BID) | ORAL | Status: DC
  Administered 2020-12-03 – 2020-12-05 (×4): 81 mg via ORAL
  Filled 2020-12-03 (×4): qty 1

## 2020-12-03 MED ORDER — LEVOTHYROXINE SODIUM 125 MCG PO TABS
125.0000 ug | ORAL_TABLET | ORAL | Status: DC
  Administered 2020-12-04: 125 ug via ORAL
  Filled 2020-12-03 (×2): qty 1

## 2020-12-03 MED ORDER — PHENYLEPHRINE HCL-NACL 20-0.9 MG/250ML-% IV SOLN
INTRAVENOUS | Status: DC | PRN
  Administered 2020-12-03: 40 ug/min via INTRAVENOUS

## 2020-12-03 MED ORDER — SODIUM CHLORIDE 0.9 % IV SOLN: INTRAVENOUS | Status: DC

## 2020-12-03 MED ORDER — TIOTROPIUM BROMIDE MONOHYDRATE 18 MCG IN CAPS: 18.0000 ug | ORAL_CAPSULE | Freq: Every day | RESPIRATORY_TRACT | Status: DC

## 2020-12-03 MED ORDER — HYDROCODONE-ACETAMINOPHEN 5-325 MG PO TABS
1.0000 | ORAL_TABLET | ORAL | Status: DC | PRN
  Administered 2020-12-04 – 2020-12-05 (×4): 1 via ORAL
  Administered 2020-12-06 – 2020-12-07 (×3): 2 via ORAL
  Filled 2020-12-03: qty 1
  Filled 2020-12-03: qty 2
  Filled 2020-12-03: qty 1
  Filled 2020-12-03: qty 2
  Filled 2020-12-03 (×3): qty 1
  Filled 2020-12-03: qty 2

## 2020-12-03 MED ORDER — MAGNESIUM OXIDE -MG SUPPLEMENT 400 (240 MG) MG PO TABS
400.0000 mg | ORAL_TABLET | Freq: Every day | ORAL | Status: DC
  Administered 2020-12-04 – 2020-12-07 (×4): 400 mg via ORAL
  Filled 2020-12-03 (×4): qty 1

## 2020-12-03 MED ORDER — ACETAMINOPHEN 500 MG PO TABS
1000.0000 mg | ORAL_TABLET | Freq: Once | ORAL | Status: DC
  Filled 2020-12-03: qty 2

## 2020-12-03 MED ORDER — ALBUTEROL SULFATE HFA 108 (90 BASE) MCG/ACT IN AERS
1.0000 | INHALATION_SPRAY | Freq: Four times a day (QID) | RESPIRATORY_TRACT | Status: DC | PRN
Start: 2020-12-03 — End: 2020-12-03

## 2020-12-03 MED ORDER — TORSEMIDE 20 MG PO TABS
20.0000 mg | ORAL_TABLET | Freq: Every day | ORAL | Status: DC
  Administered 2020-12-03 – 2020-12-05 (×3): 20 mg via ORAL
  Filled 2020-12-03 (×3): qty 1

## 2020-12-03 MED ORDER — SENNA 8.6 MG PO TABS
1.0000 | ORAL_TABLET | Freq: Two times a day (BID) | ORAL | Status: DC
  Administered 2020-12-04 – 2020-12-07 (×7): 8.6 mg via ORAL
  Filled 2020-12-03 (×7): qty 1

## 2020-12-03 MED ORDER — CEFAZOLIN SODIUM-DEXTROSE 2-4 GM/100ML-% IV SOLN
2.0000 g | INTRAVENOUS | Status: AC
  Administered 2020-12-03: 2 g via INTRAVENOUS
  Filled 2020-12-03: qty 100

## 2020-12-03 MED ORDER — KETOROLAC TROMETHAMINE 30 MG/ML IJ SOLN
INTRAMUSCULAR | Status: DC | PRN
  Administered 2020-12-03: 30 mg via INTRAMUSCULAR

## 2020-12-03 MED ORDER — VITAMIN D3 125 MCG (5000 UT) PO CAPS: 5000.0000 [IU] | ORAL_CAPSULE | Freq: Every day | ORAL | Status: DC

## 2020-12-03 MED ORDER — TRANEXAMIC ACID-NACL 1000-0.7 MG/100ML-% IV SOLN
1000.0000 mg | INTRAVENOUS | Status: AC
  Administered 2020-12-03: 1000 mg via INTRAVENOUS
  Filled 2020-12-03: qty 100

## 2020-12-03 MED ORDER — METHOCARBAMOL 500 MG PO TABS
500.0000 mg | ORAL_TABLET | Freq: Four times a day (QID) | ORAL | Status: DC | PRN
  Administered 2020-12-04 – 2020-12-06 (×4): 500 mg via ORAL
  Filled 2020-12-03 (×5): qty 1

## 2020-12-03 MED ORDER — SODIUM CHLORIDE (PF) 0.9 % IJ SOLN
INTRAMUSCULAR | Status: AC
  Filled 2020-12-03: qty 30

## 2020-12-03 MED ORDER — ONDANSETRON HCL 4 MG/2ML IJ SOLN
4.0000 mg | Freq: Four times a day (QID) | INTRAMUSCULAR | Status: DC | PRN
  Administered 2020-12-03: 4 mg via INTRAVENOUS
  Filled 2020-12-03: qty 2

## 2020-12-03 MED ORDER — AMISULPRIDE (ANTIEMETIC) 5 MG/2ML IV SOLN: 10.0000 mg | Freq: Once | INTRAVENOUS | Status: DC | PRN

## 2020-12-03 MED ORDER — SODIUM CHLORIDE 0.9 % IR SOLN
Status: DC | PRN
  Administered 2020-12-03: 2000 mL
  Administered 2020-12-03: 1000 mL

## 2020-12-03 MED ORDER — ALBUMIN HUMAN 5 % IV SOLN: 25.0000 g | Freq: Once | INTRAVENOUS | Status: AC

## 2020-12-03 MED ORDER — UMECLIDINIUM BROMIDE 62.5 MCG/ACT IN AEPB
1.0000 | INHALATION_SPRAY | Freq: Every day | RESPIRATORY_TRACT | Status: DC
  Administered 2020-12-04 – 2020-12-07 (×4): 1 via RESPIRATORY_TRACT
  Filled 2020-12-03: qty 7

## 2020-12-03 MED ORDER — POVIDONE-IODINE 10 % EX SWAB
2.0000 "application " | Freq: Once | CUTANEOUS | Status: AC
  Administered 2020-12-03: 2 via TOPICAL

## 2020-12-03 MED ORDER — LACTATED RINGERS IV SOLN: INTRAVENOUS | Status: DC

## 2020-12-03 MED ORDER — VITAMIN D 25 MCG (1000 UNIT) PO TABS
5000.0000 [IU] | ORAL_TABLET | Freq: Every day | ORAL | Status: DC
Start: 2020-12-04 — End: 2020-12-07
  Administered 2020-12-04 – 2020-12-07 (×4): 5000 [IU] via ORAL
  Filled 2020-12-03 (×4): qty 5

## 2020-12-03 MED ORDER — MULTI-VITAMINS PO TABS: 1.0000 | ORAL_TABLET | Freq: Every day | ORAL | Status: DC

## 2020-12-03 MED ORDER — PROPOFOL 1000 MG/100ML IV EMUL
INTRAVENOUS | Status: AC
  Filled 2020-12-03: qty 100

## 2020-12-03 MED ORDER — POVIDONE-IODINE 10 % EX SWAB: 2.0000 "application " | Freq: Once | CUTANEOUS | Status: DC

## 2020-12-03 MED ORDER — BUPIVACAINE-EPINEPHRINE 0.25% -1:200000 IJ SOLN
INTRAMUSCULAR | Status: DC | PRN
  Administered 2020-12-03: 30 mL

## 2020-12-03 MED ORDER — PHENYLEPHRINE HCL (PRESSORS) 10 MG/ML IV SOLN
INTRAVENOUS | Status: AC
  Filled 2020-12-03: qty 1

## 2020-12-03 MED ORDER — MORPHINE SULFATE (PF) 2 MG/ML IV SOLN
0.5000 mg | INTRAVENOUS | Status: DC | PRN
  Administered 2020-12-03: 0.5 mg via INTRAVENOUS
  Filled 2020-12-03: qty 1

## 2020-12-03 MED ORDER — KETOROLAC TROMETHAMINE 30 MG/ML IJ SOLN
INTRAMUSCULAR | Status: AC
  Filled 2020-12-03: qty 1

## 2020-12-03 MED ORDER — PANTOPRAZOLE SODIUM 40 MG PO TBEC
40.0000 mg | DELAYED_RELEASE_TABLET | Freq: Every day | ORAL | Status: DC
  Administered 2020-12-04 – 2020-12-07 (×4): 40 mg via ORAL
  Filled 2020-12-03 (×4): qty 1

## 2020-12-03 MED ORDER — SPIRONOLACTONE 25 MG PO TABS
25.0000 mg | ORAL_TABLET | Freq: Every day | ORAL | Status: DC
  Administered 2020-12-03 – 2020-12-05 (×3): 25 mg via ORAL
  Filled 2020-12-03 (×3): qty 1

## 2020-12-03 MED ORDER — MENTHOL 3 MG MT LOZG: 1.0000 | LOZENGE | OROMUCOSAL | Status: DC | PRN

## 2020-12-03 MED ORDER — PROPOFOL 500 MG/50ML IV EMUL
INTRAVENOUS | Status: DC | PRN
  Administered 2020-12-03: 75 ug/kg/min via INTRAVENOUS

## 2020-12-03 MED ORDER — POLYETHYLENE GLYCOL 3350 17 G PO PACK: 17.0000 g | PACK | Freq: Every day | ORAL | Status: DC | PRN

## 2020-12-03 MED ORDER — FENTANYL CITRATE PF 50 MCG/ML IJ SOSY: 25.0000 ug | PREFILLED_SYRINGE | INTRAMUSCULAR | Status: DC | PRN

## 2020-12-03 MED ORDER — PHENOL 1.4 % MT LIQD: 1.0000 | OROMUCOSAL | Status: DC | PRN

## 2020-12-03 MED ORDER — PROPOFOL 10 MG/ML IV BOLUS
INTRAVENOUS | Status: AC
  Filled 2020-12-03: qty 20

## 2020-12-03 MED ORDER — ALBUMIN HUMAN 5 % IV SOLN
25.0000 g | Freq: Once | INTRAVENOUS | Status: AC
  Administered 2020-12-03: 25 g via INTRAVENOUS

## 2020-12-03 MED ORDER — BUPIVACAINE-EPINEPHRINE (PF) 0.25% -1:200000 IJ SOLN
INTRAMUSCULAR | Status: AC
  Filled 2020-12-03: qty 30

## 2020-12-03 MED ORDER — ACETAMINOPHEN 325 MG PO TABS: 325.0000 mg | ORAL_TABLET | Freq: Four times a day (QID) | ORAL | Status: DC | PRN

## 2020-12-03 MED ORDER — HYDROCODONE-ACETAMINOPHEN 7.5-325 MG PO TABS
1.0000 | ORAL_TABLET | ORAL | Status: DC | PRN
  Administered 2020-12-03 – 2020-12-06 (×5): 1 via ORAL
  Administered 2020-12-06 – 2020-12-07 (×3): 2 via ORAL
  Filled 2020-12-03 (×4): qty 1
  Filled 2020-12-03: qty 2
  Filled 2020-12-03 (×2): qty 1
  Filled 2020-12-03: qty 2
  Filled 2020-12-03: qty 1

## 2020-12-03 MED ORDER — WATER FOR IRRIGATION, STERILE IR SOLN
Status: DC | PRN
  Administered 2020-12-03: 2000 mL

## 2020-12-03 MED ORDER — ATORVASTATIN CALCIUM 10 MG PO TABS
10.0000 mg | ORAL_TABLET | Freq: Every day | ORAL | Status: DC
  Administered 2020-12-05 – 2020-12-07 (×3): 10 mg via ORAL
  Filled 2020-12-03 (×4): qty 1

## 2020-12-03 MED ORDER — METOCLOPRAMIDE HCL 5 MG/ML IJ SOLN: 5.0000 mg | Freq: Three times a day (TID) | INTRAMUSCULAR | Status: DC | PRN

## 2020-12-03 MED ORDER — METOPROLOL SUCCINATE ER 25 MG PO TB24
25.0000 mg | ORAL_TABLET | Freq: Every day | ORAL | Status: DC
  Administered 2020-12-04 – 2020-12-07 (×2): 25 mg via ORAL
  Filled 2020-12-03 (×4): qty 1

## 2020-12-03 MED ORDER — LORATADINE 10 MG PO TABS
10.0000 mg | ORAL_TABLET | Freq: Every day | ORAL | Status: DC
  Administered 2020-12-04 – 2020-12-07 (×4): 10 mg via ORAL
  Filled 2020-12-03 (×4): qty 1

## 2020-12-03 MED ORDER — SODIUM CHLORIDE (PF) 0.9 % IJ SOLN
INTRAMUSCULAR | Status: DC | PRN
  Administered 2020-12-03: 30 mL

## 2020-12-03 MED ORDER — ADULT MULTIVITAMIN W/MINERALS CH
1.0000 | ORAL_TABLET | Freq: Every day | ORAL | Status: DC
  Administered 2020-12-04 – 2020-12-07 (×4): 1 via ORAL
  Filled 2020-12-03 (×4): qty 1

## 2020-12-03 MED ORDER — DEXAMETHASONE SODIUM PHOSPHATE 10 MG/ML IJ SOLN
10.0000 mg | Freq: Once | INTRAMUSCULAR | Status: AC
  Administered 2020-12-04: 10 mg via INTRAVENOUS
  Filled 2020-12-03: qty 1

## 2020-12-03 MED ORDER — MOMETASONE FURO-FORMOTEROL FUM 200-5 MCG/ACT IN AERO
2.0000 | INHALATION_SPRAY | Freq: Two times a day (BID) | RESPIRATORY_TRACT | Status: DC
  Administered 2020-12-03 – 2020-12-07 (×8): 2 via RESPIRATORY_TRACT
  Filled 2020-12-03: qty 8.8

## 2020-12-03 MED ORDER — CHLORHEXIDINE GLUCONATE 0.12 % MT SOLN
15.0000 mL | Freq: Once | OROMUCOSAL | Status: AC
  Administered 2020-12-03: 15 mL via OROMUCOSAL

## 2020-12-03 MED ORDER — LEVOTHYROXINE SODIUM 112 MCG PO TABS
137.0000 ug | ORAL_TABLET | ORAL | Status: DC
  Administered 2020-12-05 – 2020-12-06 (×2): 137 ug via ORAL
  Filled 2020-12-03 (×2): qty 1

## 2020-12-03 MED ORDER — METOCLOPRAMIDE HCL 5 MG PO TABS: 5.0000 mg | ORAL_TABLET | Freq: Three times a day (TID) | ORAL | Status: DC | PRN

## 2020-12-03 MED ORDER — ISOPROPYL ALCOHOL 70 % SOLN
Status: DC | PRN
  Administered 2020-12-03: 1 via TOPICAL

## 2020-12-03 MED ORDER — HYDROCODONE-ACETAMINOPHEN 5-325 MG PO TABS
ORAL_TABLET | ORAL | Status: AC
  Filled 2020-12-03: qty 2

## 2020-12-03 MED ORDER — CEFAZOLIN SODIUM-DEXTROSE 2-4 GM/100ML-% IV SOLN
2.0000 g | Freq: Four times a day (QID) | INTRAVENOUS | Status: AC
  Administered 2020-12-03 (×2): 2 g via INTRAVENOUS
  Filled 2020-12-03 (×2): qty 100

## 2020-12-03 MED ORDER — PROMETHAZINE HCL 25 MG/ML IJ SOLN
6.2500 mg | INTRAMUSCULAR | Status: DC | PRN
Start: 2020-12-03 — End: 2020-12-03

## 2020-12-03 MED ORDER — DOCUSATE SODIUM 100 MG PO CAPS
100.0000 mg | ORAL_CAPSULE | Freq: Two times a day (BID) | ORAL | Status: DC
  Administered 2020-12-03 – 2020-12-07 (×8): 100 mg via ORAL
  Filled 2020-12-03 (×8): qty 1

## 2020-12-03 MED ORDER — ALBUTEROL SULFATE (2.5 MG/3ML) 0.083% IN NEBU: 2.5000 mg | INHALATION_SOLUTION | Freq: Four times a day (QID) | RESPIRATORY_TRACT | Status: DC | PRN

## 2020-12-03 MED ORDER — ALBUMIN HUMAN 5 % IV SOLN
INTRAVENOUS | Status: AC
  Filled 2020-12-03: qty 500

## 2020-12-03 MED ORDER — ALBUMIN HUMAN 5 % IV SOLN
INTRAVENOUS | Status: AC
  Administered 2020-12-03: 25 g via INTRAVENOUS
  Filled 2020-12-03: qty 500

## 2020-12-03 MED ORDER — ALUM & MAG HYDROXIDE-SIMETH 200-200-20 MG/5ML PO SUSP: 30.0000 mL | ORAL | Status: DC | PRN

## 2020-12-03 MED ORDER — METHOCARBAMOL 500 MG IVPB - SIMPLE MED
500.0000 mg | Freq: Four times a day (QID) | INTRAVENOUS | Status: DC | PRN
  Filled 2020-12-03: qty 50

## 2020-12-03 SURGICAL SUPPLY — 58 items
BAG COUNTER SPONGE SURGICOUNT (BAG) IMPLANT
BAG DECANTER FOR FLEXI CONT (MISCELLANEOUS) IMPLANT
BAG ZIPLOCK 12X15 (MISCELLANEOUS) IMPLANT
CHLORAPREP W/TINT 26 (MISCELLANEOUS) ×2 IMPLANT
COVER PERINEAL POST (MISCELLANEOUS) ×2 IMPLANT
COVER SURGICAL LIGHT HANDLE (MISCELLANEOUS) ×2 IMPLANT
CUP SECTOR GRIPTON 50MM (Cup) ×2 IMPLANT
DECANTER SPIKE VIAL GLASS SM (MISCELLANEOUS) ×2 IMPLANT
DERMABOND ADVANCED (GAUZE/BANDAGES/DRESSINGS) ×1
DERMABOND ADVANCED .7 DNX12 (GAUZE/BANDAGES/DRESSINGS) ×1 IMPLANT
DRAPE IMP U-DRAPE 54X76 (DRAPES) ×2 IMPLANT
DRAPE SHEET LG 3/4 BI-LAMINATE (DRAPES) ×6 IMPLANT
DRAPE STERI IOBAN 125X83 (DRAPES) ×2 IMPLANT
DRAPE U-SHAPE 47X51 STRL (DRAPES) ×4 IMPLANT
DRESSING AQUACEL AG SP 3.5X10 (GAUZE/BANDAGES/DRESSINGS) ×2 IMPLANT
DRSG AQUACEL AG ADV 3.5X10 (GAUZE/BANDAGES/DRESSINGS) ×2 IMPLANT
DRSG AQUACEL AG SP 3.5X10 (GAUZE/BANDAGES/DRESSINGS) ×4
ELECT REM PT RETURN 15FT ADLT (MISCELLANEOUS) ×2 IMPLANT
GAUZE SPONGE 4X4 12PLY STRL (GAUZE/BANDAGES/DRESSINGS) ×2 IMPLANT
GLOVE SRG 8 PF TXTR STRL LF DI (GLOVE) ×1 IMPLANT
GLOVE SURG ENC MOIS LTX SZ8.5 (GLOVE) ×4 IMPLANT
GLOVE SURG ENC TEXT LTX SZ7.5 (GLOVE) ×4 IMPLANT
GLOVE SURG UNDER POLY LF SZ8 (GLOVE) ×1
GLOVE SURG UNDER POLY LF SZ8.5 (GLOVE) ×2 IMPLANT
GOWN SPEC L3 XXLG W/TWL (GOWN DISPOSABLE) ×2 IMPLANT
GOWN STRL REUS W/TWL XL LVL3 (GOWN DISPOSABLE) ×2 IMPLANT
HANDPIECE INTERPULSE COAX TIP (DISPOSABLE) ×1
HEAD FEM STD 32X+1 STRL (Hips) ×2 IMPLANT
HOLDER FOLEY CATH W/STRAP (MISCELLANEOUS) ×2 IMPLANT
HOOD PEEL AWAY FLYTE STAYCOOL (MISCELLANEOUS) ×8 IMPLANT
JET LAVAGE IRRISEPT WOUND (IRRIGATION / IRRIGATOR) ×2
KIT TURNOVER KIT A (KITS) IMPLANT
LAVAGE JET IRRISEPT WOUND (IRRIGATION / IRRIGATOR) ×1 IMPLANT
LINER ACETABULAR 32X50 (Liner) ×2 IMPLANT
MANIFOLD NEPTUNE II (INSTRUMENTS) ×2 IMPLANT
MARKER SKIN DUAL TIP RULER LAB (MISCELLANEOUS) ×2 IMPLANT
NDL SAFETY ECLIPSE 18X1.5 (NEEDLE) ×1 IMPLANT
NEEDLE HYPO 18GX1.5 SHARP (NEEDLE) ×1
NEEDLE SPNL 18GX3.5 QUINCKE PK (NEEDLE) ×2 IMPLANT
PACK ANTERIOR HIP CUSTOM (KITS) ×2 IMPLANT
PENCIL SMOKE EVACUATOR (MISCELLANEOUS) IMPLANT
SAW OSC TIP CART 19.5X105X1.3 (SAW) ×2 IMPLANT
SEALER BIPOLAR AQUA 6.0 (INSTRUMENTS) ×2 IMPLANT
SET HNDPC FAN SPRY TIP SCT (DISPOSABLE) ×1 IMPLANT
STAPLER INSORB 30 2030 C-SECTI (MISCELLANEOUS) IMPLANT
STEM FEM CMNTLSS SM AML 13.5 (Hips) ×2 IMPLANT
SUT ETHILON 2 0 PSLX (SUTURE) ×2 IMPLANT
SUT MNCRL AB 3-0 PS2 18 (SUTURE) ×2 IMPLANT
SUT MON AB 2-0 CT1 36 (SUTURE) ×2 IMPLANT
SUT STRATAFIX PDO 1 14 VIOLET (SUTURE) ×1
SUT STRATFX PDO 1 14 VIOLET (SUTURE) ×1
SUT VIC AB 2-0 CT1 27 (SUTURE)
SUT VIC AB 2-0 CT1 TAPERPNT 27 (SUTURE) IMPLANT
SUTURE STRATFX PDO 1 14 VIOLET (SUTURE) ×1 IMPLANT
SYR 3ML LL SCALE MARK (SYRINGE) ×2 IMPLANT
TRAY FOLEY MTR SLVR 16FR STAT (SET/KITS/TRAYS/PACK) IMPLANT
TUBE SUCTION HIGH CAP CLEAR NV (SUCTIONS) ×2 IMPLANT
WATER STERILE IRR 1000ML POUR (IV SOLUTION) ×2 IMPLANT

## 2020-12-03 NOTE — Anesthesia Postprocedure Evaluation (Signed)
Anesthesia Post Note  Patient: Eileen Wright  Procedure(s) Performed: TOTAL HIP ARTHROPLASTY ANTERIOR APPROACH (Left: Hip)     Patient location during evaluation: PACU Anesthesia Type: Spinal Level of consciousness: awake and alert Pain management: pain level controlled Vital Signs Assessment: post-procedure vital signs reviewed and stable Respiratory status: spontaneous breathing and respiratory function stable Cardiovascular status: blood pressure returned to baseline and stable Postop Assessment: spinal receding Anesthetic complications: no   No notable events documented.  Last Vitals:  Vitals:   12/03/20 1145 12/03/20 1200  BP: (!) 96/43 (!) 110/48  Pulse: 87 87  Resp: 16 16  Temp: 36.4 C   SpO2: 99% 99%    Last Pain:  Vitals:   12/03/20 1200  TempSrc:   PainSc: 0-No pain    LLE Motor Response: Purposeful movement (12/03/20 1200) LLE Sensation: Increased (12/03/20 1200) RLE Motor Response: Purposeful movement (12/03/20 1200) RLE Sensation: Increased (12/03/20 1200) L Sensory Level: S1-Sole of foot, small toes (12/03/20 1200) R Sensory Level: S1-Sole of foot, small toes (12/03/20 1200)  Clebert Wenger DANIEL

## 2020-12-03 NOTE — Plan of Care (Signed)
  Problem: Clinical Measurements: Goal: Respiratory complications will improve Outcome: Progressing   Problem: Clinical Measurements: Goal: Cardiovascular complication will be avoided Outcome: Progressing   Problem: Activity: Goal: Risk for activity intolerance will decrease Outcome: Progressing   Problem: Nutrition: Goal: Adequate nutrition will be maintained Outcome: Progressing   

## 2020-12-03 NOTE — Transfer of Care (Signed)
Immediate Anesthesia Transfer of Care Note  Patient: Eileen Wright  Procedure(s) Performed: TOTAL HIP ARTHROPLASTY ANTERIOR APPROACH (Left: Hip)  Patient Location: PACU  Anesthesia Type:Spinal  Level of Consciousness: awake, alert , oriented and patient cooperative  Airway & Oxygen Therapy: Patient Spontanous Breathing and Patient connected to face mask oxygen  Post-op Assessment: Report given to RN and Post -op Vital signs reviewed and stable  Post vital signs: Reviewed and stable  Last Vitals:  Vitals Value Taken Time  BP    Temp    Pulse    Resp    SpO2      Last Pain:  Vitals:   12/03/20 0611  TempSrc:   PainSc: 5       Patients Stated Pain Goal: 4 (12/03/20 5974)  Complications: No notable events documented.

## 2020-12-03 NOTE — Discharge Instructions (Signed)
 Dr. Ladasia Sircy Joint Replacement Specialist [facility] [address] [place], [place] [address] [phone]   TOTAL HIP REPLACEMENT POSTOPERATIVE DIRECTIONS    Hip Rehabilitation, Guidelines Following Surgery   WEIGHT BEARING Weight bearing as tolerated with assist device (walker, cane, etc) as directed, use it as long as suggested by your surgeon or therapist, typically at least 4-6 weeks.  The results of a hip operation are greatly improved after range of motion and muscle strengthening exercises. Follow all safety measures which are given to protect your hip. If any of these exercises cause increased pain or swelling in your joint, decrease the amount until you are comfortable again. Then slowly increase the exercises. Call your caregiver if you have problems or questions.   HOME CARE INSTRUCTIONS  Most of the following instructions are designed to prevent the dislocation of your new hip.  Remove items at home which could result in a fall. This includes throw rugs or furniture in walking pathways.  Continue medications as instructed at time of discharge. You may have some home medications which will be placed on hold until you complete the course of blood thinner medication. You may start showering once you are discharged home. Do not remove your dressing. Do not put on socks or shoes without following the instructions of your caregivers.   Sit on chairs with arms. Use the chair arms to help push yourself up when arising.  Arrange for the use of a toilet seat elevator so you are not sitting low.  Walk with walker as instructed.  You may resume a sexual relationship in one month or when given the OK by your caregiver.  Use walker as long as suggested by your caregivers.  You may put full weight on your legs and walk as much as is comfortable. Avoid periods of inactivity such as sitting longer than an hour when not asleep. This helps prevent blood  clots.  You may return to work once you are cleared by your surgeon.  Do not drive a car for 6 weeks or until released by your surgeon.  Do not drive while taking narcotics.  Wear elastic stockings for two weeks following surgery during the day but you may remove then at night.  Make sure you keep all of your appointments after your operation with all of your doctors and caregivers. You should call the office at the above phone number and make an appointment for approximately two weeks after the date of your surgery. Please pick up a stool softener and laxative for home use as long as you are requiring pain medications. ICE to the affected hip every three hours for 30 minutes at a time and then as needed for pain and swelling. Continue to use ice on the hip for pain and swelling from surgery. You may notice swelling that will progress down to the foot and ankle.  This is normal after surgery.  Elevate the leg when you are not up walking on it.   It is important for you to complete the blood thinner medication as prescribed by your doctor. Continue to use the breathing machine which will help keep your temperature down.  It is common for your temperature to cycle up and down following surgery, especially at night when you are not up moving around and exerting yourself.  The breathing machine keeps your lungs expanded and your temperature down.  RANGE OF MOTION AND STRENGTHENING EXERCISES  These exercises are designed to help you   keep full movement of your hip joint. Follow your caregiver's or physical therapist's instructions. Perform all exercises about fifteen times, three times per day or as directed. Exercise both hips, even if you have had only one joint replacement. These exercises can be done on a training (exercise) mat, on the floor, on a table or on a bed. Use whatever works the best and is most comfortable for you. Use music or television while you are exercising so that the exercises are a  pleasant break in your day. This will make your life better with the exercises acting as a break in routine you can look forward to.  Lying on your back, slowly slide your foot toward your buttocks, raising your knee up off the floor. Then slowly slide your foot back down until your leg is straight again.  Lying on your back, tighten up the muscle in the front of your thigh (quadriceps muscles). You can do this by keeping your leg straight and trying to raise your heel off the floor. This helps strengthen the largest muscle supporting your knee.  Lying on your back, tighten up the muscles of your buttocks both with the legs straight and with the knee bent at a comfortable angle while keeping your heel on the floor.   SKILLED REHAB INSTRUCTIONS: If the patient is transferred to a skilled rehab facility following release from the hospital, a list of the current medications will be sent to the facility for the patient to continue.  When discharged from the skilled rehab facility, please have the facility set up the patient's Home Health Physical Therapy prior to being released. Also, the skilled facility will be responsible for providing the patient with their medications at time of release from the facility to include their pain medication and their blood thinner medication. If the patient is still at the rehab facility at time of the two week follow up appointment, the skilled rehab facility will also need to assist the patient in arranging follow up appointment in our office and any transportation needs.  POST-OPERATIVE OPIOID TAPER INSTRUCTIONS: It is important to wean off of your opioid medication as soon as possible. If you do not need pain medication after your surgery it is ok to stop day one. Opioids include: Codeine, Hydrocodone(Norco, Vicodin), Oxycodone(Percocet, oxycontin) and hydromorphone amongst others.  Long term and even short term use of opiods can cause: Increased pain  response Dependence Constipation Depression Respiratory depression And more.  Withdrawal symptoms can include Flu like symptoms Nausea, vomiting And more Techniques to manage these symptoms Hydrate well Eat regular healthy meals Stay active Use relaxation techniques(deep breathing, meditating, yoga) Do Not substitute Alcohol to help with tapering If you have been on opioids for less than two weeks and do not have pain than it is ok to stop all together.  Plan to wean off of opioids This plan should start within one week post op of your joint replacement. Maintain the same interval or time between taking each dose and first decrease the dose.  Cut the total daily intake of opioids by one tablet each day Next start to increase the time between doses. The last dose that should be eliminated is the evening dose.    MAKE SURE YOU:  Understand these instructions.  Will watch your condition.  Will get help right away if you are not doing well or get worse.  Pick up stool softner and laxative for home use following surgery while on pain medications. Do not  remove your dressing. The dressing is waterproof--it is OK to take showers. Continue to use ice for pain and swelling after surgery. Do not use any lotions or creams on the incision until instructed by your surgeon. No active abduction for 6 weeks. Total Hip Protocol.

## 2020-12-03 NOTE — Evaluation (Signed)
Physical Therapy Evaluation Patient Details Name: Eileen Wright MRN: [id number] DOB: [birth year] Today's Date: 12/03/2020  History of Present Illness  Patient is 80 y.o. female s/p Lt THA anterior approach on 12/03/20 with PMH significant for anemia, OA, COPD, GERD, HLD, HTN, hypotyroidism.    Clinical Impression  Eileen Wright is a 80 y.o. female POD 0 s/p Lt THA anterior approach. Patient reports independence with RW for mobility at baseline. Patient is now limited by functional impairments (see PT problem list below) and requires min assist for bed mobility, transfers, and gait with RW. Patient was able to ambulate ~9 feet with RW and min assist. Patient instructed in exercise to facilitate circulation to manage edema and reduce risk of DVT. Patient will benefit from continued skilled PT interventions to address impairments and progress towards PLOF. Acute PT will follow to progress mobility and stair training in preparation for safe discharge home.        Recommendations for follow up therapy are one component of a multi-disciplinary discharge planning process, led by the attending physician.  Recommendations may be updated based on patient status, additional functional criteria and insurance authorization.  Follow Up Recommendations Home health PT    Assistance Recommended at Discharge Frequent or constant Supervision/Assistance  Functional Status Assessment Patient has had a recent decline in their functional status and demonstrates the ability to make significant improvements in function in a reasonable and predictable amount of time.  Equipment Recommendations  None recommended by PT    Recommendations for Other Services       Precautions / Restrictions Precautions Precautions: Fall Restrictions Weight Bearing Restrictions: No Other Position/Activity Restrictions: WBAT      Mobility  Bed Mobility Overal bed mobility: Needs Assistance Bed Mobility: Supine to Sit      Supine to sit: Min assist;HOB elevated     General bed mobility comments: assist to bring Bil LE's off EOB and to raise trunk upright.    Transfers Overall transfer level: Needs assistance Equipment used: Rolling walker (2 wheels) Transfers: Sit to/from Stand Sit to Stand: Min assist           General transfer comment: cues for hand placement, pt using bil UE to power up and min assist to steady with hand transition to RW    Ambulation/Gait Ambulation/Gait assistance: Min assist;Min guard Gait Distance (Feet): 9 Feet Assistive device: Rolling walker (2 wheels) Gait Pattern/deviations: Step-to pattern;Decreased stride length Gait velocity: decr     General Gait Details: cues for step to pattern and assist to advance and manage RW.  Stairs            Wheelchair Mobility    Modified Rankin (Stroke Patients Only)       Balance Overall balance assessment: Needs assistance Sitting-balance support: Feet supported Sitting balance-Leahy Scale: Fair     Standing balance support: Reliant on assistive device for balance;During functional activity;Bilateral upper extremity supported Standing balance-Leahy Scale: Poor                               Pertinent Vitals/Pain Pain Assessment: Faces Faces Pain Scale: Hurts a little bit Pain Location: Lt hip Pain Descriptors / Indicators: Aching;Discomfort Pain Intervention(s): Limited activity within patient's tolerance;Monitored during session;Repositioned;Ice applied    Home Living Family/patient expects to be discharged to:: Private residence Living Arrangements: Children Available Help at Discharge: Family Type of Home: House Home Access: Stairs to enter Entrance Stairs-Rails: None Entrance  Stairs-Number of Steps: 2   Home Layout: One level Home Equipment: Agricultural consultant (2 wheels);Rollator (4 wheels)      Prior Function Prior Level of Function : Independent/Modified Independent              Mobility Comments: pt using rollator/RW for mobility at home ADLs Comments: pt able to microwave meals, and bath/dress self     Hand Dominance   Dominant Hand: Right    Extremity/Trunk Assessment   Upper Extremity Assessment Upper Extremity Assessment: Generalized weakness    Lower Extremity Assessment Lower Extremity Assessment: Generalized weakness    Cervical / Trunk Assessment Cervical / Trunk Assessment: Kyphotic  Communication   Communication: No difficulties  Cognition Arousal/Alertness: Awake/alert Behavior During Therapy: WFL for tasks assessed/performed Overall Cognitive Status: Within Functional Limits for tasks assessed                                          General Comments      Exercises Total Joint Exercises Ankle Circles/Pumps: AROM;Both;20 reps;Seated   Assessment/Plan    PT Assessment Patient needs continued PT services  PT Problem List Decreased strength;Decreased range of motion;Decreased activity tolerance;Decreased balance;Decreased mobility;Decreased knowledge of use of DME;Decreased knowledge of precautions;Pain       PT Treatment Interventions DME instruction;Gait training;Functional mobility training;Therapeutic activities;Stair training;Therapeutic exercise;Balance training;Patient/family education    PT Goals (Current goals can be found in the Care Plan section)  Acute Rehab PT Goals Patient Stated Goal: recover independence PT Goal Formulation: With patient/family Time For Goal Achievement: 12/10/20 Potential to Achieve Goals: Good    Frequency 7X/week   Barriers to discharge        Co-evaluation               AM-PAC PT "6 Clicks" Mobility  Outcome Measure Help needed turning from your back to your side while in a flat bed without using bedrails?: A Little Help needed moving from lying on your back to sitting on the side of a flat bed without using bedrails?: A Little Help needed moving to and from  a bed to a chair (including a wheelchair)?: A Little Help needed standing up from a chair using your arms (e.g., wheelchair or bedside chair)?: A Little Help needed to walk in hospital room?: A Little Help needed climbing 3-5 steps with a railing? : A Lot 6 Click Score: 17    End of Session Equipment Utilized During Treatment: Gait belt Activity Tolerance: Patient tolerated treatment well Patient left: in chair;with call bell/phone within reach;with chair alarm set;with family/visitor present Nurse Communication: Mobility status PT Visit Diagnosis: Muscle weakness (generalized) (M62.81);Difficulty in walking, not elsewhere classified (R26.2)    Time: 9147-8295 PT Time Calculation (min) (ACUTE ONLY): 25 min   Charges:   PT Evaluation $PT Eval Low Complexity: 1 Low PT Treatments $Gait Training: 8-22 mins        Wynn Maudlin, DPT Acute Rehabilitation Services Office [phone] Pager [phone]   Anitra Lauth 12/03/2020, 4:06 PM

## 2020-12-03 NOTE — Op Note (Signed)
OPERATIVE REPORT  SURGEON: Rod Can, MD   ASSISTANT: Cherlynn June, PA-C.  PREOPERATIVE DIAGNOSIS: Rapidly progressive Left hip arthritis.   POSTOPERATIVE DIAGNOSIS: Rapidly progressive Left hip arthritis.   PROCEDURE: Left total hip arthroplasty, anterior approach.   IMPLANTS: DePuy AML stem, size 13.5 x 155 mm, small stature. DePuy Pinnacle Cup, size 50 mm. DePuy Altrx liner, size 32 by 50 mm, neutral. DePuy metal head ball, size 32 + 1 mm.  ANESTHESIA:  MAC and Spinal  ESTIMATED BLOOD LOSS:-250 mL    ANTIBIOTICS: 2 g Ancef.  DRAINS: None.  COMPLICATIONS: Intraoperative avulsion fracture tip of greater trochanter.   CONDITION: PACU - hemodynamically stable.   BRIEF CLINICAL NOTE: Eileen Wright is a 80 y.o. female with a long-standing history of primary Left hip arthritis that recently became rapidly progressive. MRI was obtained, ruling out tumor and AVN.  After failing conservative management, the patient was indicated for total hip arthroplasty. The risks, benefits, and alternatives to the procedure were explained, and the patient elected to proceed.  PROCEDURE IN DETAIL: Surgical site was marked by myself in the pre-op holding area. Once inside the operating room, spinal anesthesia was obtained, and a foley catheter was inserted. The patient was then positioned on the Hana table.  All bony prominences were well padded.  The hip was prepped and draped in the normal sterile surgical fashion.  A time-out was called verifying side and site of surgery. The patient received IV antibiotics within 60 minutes of beginning the procedure.   Bikini incision was created three finger breadths distal to the ASIS. Superficial dissection was performed, taking care to stay lateral to the medial border of the ASIS. The direct anterior approach to the hip was performed through the Hueter interval.  Lateral femoral circumflex vessels were treated with the Auqumantys. The anterior capsule was  exposed and an inverted T capsulotomy was made. The femoral neck cut was made to the level of the templated cut.  A corkscrew was placed into the head and the head was removed.  The femoral head was found to be collapsed and have eburnated bone. The head was passed to the back table and was measured. Pubofemoral ligament was released off of the calcar, taking care to stay on bone. Superior capsule was released from the greater trochanter, taking care to stay lateral to the posterior border of the femoral neck in order to preserve the short external rotators.   Acetabular exposure was achieved, and the pulvinar and labrum were excised. Sequential reaming of the acetabulum was then performed up to a size 49 mm reamer under direct visulization. A 50 mm cup was then opened and impacted into place at approximately 40 degrees of abduction and 20 degrees of anteversion. The final polyethylene liner was impacted into place and acetabular osteophytes were removed.    I then gained femoral exposure taking care to protect the abductors and greater trochanter.  This was performed using standard external rotation, extension, and adduction.  While lowering the leg, avulsion fracture to the tip of the greater trochanter occurred. I then created a superior limb to the incision and joined the bikini incision at a right angle. I then elected to proceed with preparing for an AML stem. A cookie cutter was used to enter the femoral canal, and then the femoral canal finder was placed.  Sequential reaming and broaching was performed up to a size 13.5.  Calcar planer was used on the femoral neck remnant.  I placed a trial  neck and a trial head ball.  The hip was reduced.  Leg lengths and offset were checked fluoroscopically.  The hip was dislocated and trial components were removed.  The final implants were placed, and the hip was reduced.  Fluoroscopy was used to confirm component position and leg lengths.  At 90 degrees of external  rotation and full extension, the hip was stable to an anterior directed force.   The wound was copiously irrigated with Irrisept solution and normal saline using pule lavage.  Marcaine solution was injected into the periarticular soft tissue.  The wound was closed in layers using #1 Stratafix for the fascia, 2-0 Vicryl for the subcutaneous fat, 2-0 Monocryl for the deep dermal layer, a 2-0 nylon stitch at the T of the incision, and staples + Dermabond for the skin.  Once the glue was fully dried, an Aquacell Ag dressing was applied.  The patient was transported to the recovery room in stable condition.  Sponge, needle, and instrument counts were correct at the end of the case x2.  The patient tolerated the procedure well and there were no known complications.  Please note that a surgical assistant was a medical necessity for this procedure to perform it in a safe and expeditious manner. Assistant was necessary to provide appropriate retraction of vital neurovascular structures, to prevent femoral fracture, and to allow for anatomic placement of the prosthesis.

## 2020-12-03 NOTE — Interval H&P Note (Signed)
History and Physical Interval Note:  12/03/2020 7:08 AM  Eileen Wright  has presented today for surgery, with the diagnosis of Left hip osteoarthritis.  The various methods of treatment have been discussed with the patient and family. After consideration of risks, benefits and other options for treatment, the patient has consented to  Procedure(s): TOTAL HIP ARTHROPLASTY ANTERIOR APPROACH (Left) as a surgical intervention.  The patient's history has been reviewed, patient examined, no change in status, stable for surgery.  I have reviewed the patient's chart and labs.  Questions were answered to the patient's satisfaction.     Iline Oven Betti Goodenow

## 2020-12-04 LAB — BASIC METABOLIC PANEL
Anion gap: 5 (ref 5–15)
Anion gap: 7 (ref 5–15)
BUN: 63 mg/dL — ABNORMAL HIGH (ref 8–23)
BUN: 57 mg/dL — ABNORMAL HIGH (ref 8–23)
CO2: 19 mmol/L — ABNORMAL LOW (ref 22–32)
CO2: 18 mmol/L — ABNORMAL LOW (ref 22–32)
Calcium: 8.5 mg/dL — ABNORMAL LOW (ref 8.9–10.3)
Calcium: 9 mg/dL (ref 8.9–10.3)
Chloride: 112 mmol/L — ABNORMAL HIGH (ref 98–111)
Chloride: 110 mmol/L (ref 98–111)
Creatinine, Ser: 1.82 mg/dL — ABNORMAL HIGH (ref 0.44–1.00)
Creatinine, Ser: 1.47 mg/dL — ABNORMAL HIGH (ref 0.44–1.00)
GFR, Estimated: 28 mL/min — ABNORMAL LOW (ref >60)
GFR, Estimated: 36 mL/min — ABNORMAL LOW (ref >60)
Glucose, Bld: 115 mg/dL — ABNORMAL HIGH (ref 70–99)
Glucose, Bld: 126 mg/dL — ABNORMAL HIGH (ref 70–99)
Potassium: 5.1 mmol/L (ref 3.5–5.1)
Potassium: 5.4 mmol/L — ABNORMAL HIGH (ref 3.5–5.1)
Sodium: 136 mmol/L (ref 135–145)
Sodium: 135 mmol/L (ref 135–145)

## 2020-12-04 LAB — CBC
HCT: 22.1 % — ABNORMAL LOW (ref 36.0–46.0)
Hemoglobin: 7.1 g/dL — ABNORMAL LOW (ref 12.0–15.0)
MCH: 31 pg (ref 26.0–34.0)
MCHC: 32.1 g/dL (ref 30.0–36.0)
MCV: 96.5 fL (ref 80.0–100.0)
Platelets: 233 10*3/uL (ref 150–400)
RBC: 2.29 MIL/uL — ABNORMAL LOW (ref 3.87–5.11)
RDW: 13.2 % (ref 11.5–15.5)
WBC: 8.9 10*3/uL (ref 4.0–10.5)
nRBC: 0 % (ref 0.0–0.2)

## 2020-12-04 MED ORDER — ONDANSETRON HCL 4 MG PO TABS: 4.0000 mg | ORAL_TABLET | Freq: Four times a day (QID) | ORAL | 0 refills | Status: DC | PRN

## 2020-12-04 MED ORDER — SODIUM CHLORIDE 0.9 % IV BOLUS: 250.0000 mL | Freq: Once | INTRAVENOUS | Status: DC

## 2020-12-04 MED ORDER — ASPIRIN 81 MG PO CHEW
81.0000 mg | CHEWABLE_TABLET | Freq: Two times a day (BID) | ORAL | 1 refills | Status: AC
Start: 2020-12-04 — End: 2021-01-15

## 2020-12-04 MED ORDER — DOCUSATE SODIUM 100 MG PO CAPS: 100.0000 mg | ORAL_CAPSULE | Freq: Two times a day (BID) | ORAL | 0 refills | Status: AC

## 2020-12-04 MED ORDER — ACETAMINOPHEN 325 MG PO TABS
325.0000 mg | ORAL_TABLET | Freq: Four times a day (QID) | ORAL | Status: AC | PRN
Start: 2020-12-04 — End: ?

## 2020-12-04 MED ORDER — SENNA 8.6 MG PO TABS: 1.0000 | ORAL_TABLET | Freq: Two times a day (BID) | ORAL | 0 refills | Status: DC

## 2020-12-04 MED ORDER — HYDROCODONE-ACETAMINOPHEN 5-325 MG PO TABS: 1.0000 | ORAL_TABLET | ORAL | 0 refills | Status: DC | PRN

## 2020-12-04 NOTE — Plan of Care (Signed)
  Problem: Clinical Measurements: Goal: Ability to maintain clinical measurements within normal limits will improve Outcome: Progressing   Problem: Coping: Goal: Level of anxiety will decrease Outcome: Progressing   Problem: Pain Managment: Goal: General experience of comfort will improve Outcome: Progressing   Problem: Safety: Goal: Ability to remain free from injury will improve Outcome: Progressing   

## 2020-12-04 NOTE — Progress Notes (Signed)
Physical Therapy Treatment Patient Details Name: Eileen Wright MRN: [id number] DOB: [birth year] Today's Date: 12/04/2020   History of Present Illness Patient is 80 y.o. female s/p Lt THA anterior approach on 12/03/20 with PMH significant for anemia, OA, COPD, GERD, HLD, HTN, hypotyroidism.    PT Comments    Pt continues very cooperative and with noted improvement in mobility with pt able to ambulate short distance in room with RW.  Pt continues ltd by fatigue but denies any dizziness.  Pt's dtr in room and expressing interest in follow up HHPT.   Recommendations for follow up therapy are one component of a multi-disciplinary discharge planning process, led by the attending physician.  Recommendations may be updated based on patient status, additional functional criteria and insurance authorization.  Follow Up Recommendations  Home health PT     Assistance Recommended at Discharge Frequent or constant Supervision/Assistance  Equipment Recommendations  None recommended by PT (Dtr is verifiying pt's RW at home.)    Recommendations for Other Services       Precautions / Restrictions Precautions Precautions: Fall Restrictions Weight Bearing Restrictions: No Other Position/Activity Restrictions: WBAT     Mobility  Bed Mobility Overal bed mobility: Needs Assistance Bed Mobility: Sit to Supine       Sit to supine: Mod assist   General bed mobility comments: Increased time with assist to bring Bil LE's onto bed and to control trunk    Transfers Overall transfer level: Needs assistance Equipment used: Rolling walker (2 wheels) Transfers: Sit to/from Stand Sit to Stand: Min assist           General transfer comment: cues for LE management and use of UEs to self assist.  Physical assist to bring wt up and fwd and to balance in initial standing.    Ambulation/Gait Ambulation/Gait assistance: Min assist;+2 safety/equipment Gait Distance (Feet): 13 Feet Assistive device:  Rolling walker (2 wheels) Gait Pattern/deviations: Step-to pattern;Decreased stride length;Trunk flexed;Shuffle Gait velocity: decr     General Gait Details: Increased time with cues for sequence, posture and position from RW.   Stairs             Wheelchair Mobility    Modified Rankin (Stroke Patients Only)       Balance Overall balance assessment: Needs assistance Sitting-balance support: Feet supported Sitting balance-Leahy Scale: Fair     Standing balance support: Reliant on assistive device for balance;During functional activity;Bilateral upper extremity supported Standing balance-Leahy Scale: Poor                              Cognition Arousal/Alertness: Awake/alert Behavior During Therapy: WFL for tasks assessed/performed Overall Cognitive Status: Within Functional Limits for tasks assessed                                          Exercises      General Comments        Pertinent Vitals/Pain Pain Assessment: 0-10 Pain Score: 4  Pain Location: Lt hip Pain Descriptors / Indicators: Aching;Sore Pain Intervention(s): Limited activity within patient's tolerance;Monitored during session;Premedicated before session;Ice applied    Home Living                          Prior Function  PT Goals (current goals can now be found in the care plan section) Acute Rehab PT Goals Patient Stated Goal: recover independence PT Goal Formulation: With patient/family Time For Goal Achievement: 12/10/20 Potential to Achieve Goals: Good Progress towards PT goals: Progressing toward goals    Frequency    7X/week      PT Plan Current plan remains appropriate    Co-evaluation              AM-PAC PT "6 Clicks" Mobility   Outcome Measure  Help needed turning from your back to your side while in a flat bed without using bedrails?: A Lot Help needed moving from lying on your back to sitting on the side of  a flat bed without using bedrails?: A Lot Help needed moving to and from a bed to a chair (including a wheelchair)?: A Lot Help needed standing up from a chair using your arms (e.g., wheelchair or bedside chair)?: A Little Help needed to walk in hospital room?: Total Help needed climbing 3-5 steps with a railing? : Total 6 Click Score: 11    End of Session Equipment Utilized During Treatment: Gait belt Activity Tolerance: Patient limited by fatigue;Patient tolerated treatment well Patient left: in bed;with call bell/phone within reach;with bed alarm set;with family/visitor present Nurse Communication: Mobility status PT Visit Diagnosis: Muscle weakness (generalized) (M62.81);Difficulty in walking, not elsewhere classified (R26.2)     Time: 1537-9432 PT Time Calculation (min) (ACUTE ONLY): 20 min  Charges:  $Gait Training: 8-22 mins                     Mauro Kaufmann PT Acute Rehabilitation Services Pager [phone] Office [phone]    Ellijah Leffel 12/04/2020, 3:16 PM

## 2020-12-04 NOTE — Progress Notes (Signed)
    Subjective:  Patient reports pain as mild.  Denies N/V/CP/SOB.   Objective:   VITALS:   Vitals:   12/03/20 2042 12/04/20 0139 12/04/20 0551 12/04/20 0809  BP: 95/76 (!) 115/44 (!) 130/40   Pulse: 99 98 97   Resp: 17 16 17    Temp: 97.9 F (36.6 C) 98.6 F (37 C) 98.4 F (36.9 C)   TempSrc:      SpO2: 100% 99% 100% 99%  Weight:      Height:        NAD ABD soft Neurovascular intact Sensation intact distally Intact pulses distally Dorsiflexion/Plantar flexion intact Incision: dressing C/D/I   Lab Results  Component Value Date   WBC 8.9 12/04/2020   HGB 7.1 (L) 12/04/2020   HCT 22.1 (L) 12/04/2020   MCV 96.5 12/04/2020   PLT 233 12/04/2020   BMET    Component Value Date/Time   NA 136 12/04/2020 0357   K 5.1 12/04/2020 0357   CL 112 (H) 12/04/2020 0357   CO2 19 (L) 12/04/2020 0357   GLUCOSE 115 (H) 12/04/2020 0357   BUN 63 (H) 12/04/2020 0357   CREATININE 1.82 (H) 12/04/2020 0357   CALCIUM 8.5 (L) 12/04/2020 0357   GFRNONAA 28 (L) 12/04/2020 0357     Assessment/Plan: 1 Day Post-Op   Principal Problem:   Osteoarthritis of left hip Active Problems:   Primary osteoarthritis of left hip   WBAT with walker DVT ppx: Aspirin, SCDs, TEDS PO pain control PT/OT ABLA: HgB 7.1 this AM. Currently asymptomatic. Patient's baseline HgB was 10.1 preoperatively. Continue to monitor. Recheck CBC overnight if patient isn't discharged and plan to transfuse if below 7.0 Dispo: D/C once cleared by therapy.     14/02/2020 12/04/2020, 8:12 AM   [facility] is now [facility] [address], [address], [place], [place] Phone: [phone] [email or web address] Facebook  [phone]

## 2020-12-04 NOTE — Progress Notes (Addendum)
Subjective: 1 Day Post-Op Procedure(s) (LRB): TOTAL HIP ARTHROPLASTY ANTERIOR APPROACH (Left)  Patient reports pain as mild.    Objective:   VITALS:  Temp:  [98.4 F (36.9 C)-99.1 F (37.3 C)] 99.1 F (37.3 C) (12/02 2159) Pulse Rate:  [92-100] 92 (12/02 2159) Resp:  [16-18] 16 (12/02 2159) BP: (101-130)/(40-53) 101/53 (12/02 2159) SpO2:  [96 %-100 %] 96 % (12/02 2159)  Neurovascular intact Sensation intact distally Intact pulses distally Dorsiflexion/Plantar flexion intact Incision: dressing C/D/I No cellulitis present Compartment soft   LABS Recent Labs    12/04/20 0357  HGB 7.1*  WBC 8.9  PLT 233   Recent Labs    12/04/20 0357 12/04/20 1253  NA 136 135  K 5.1 5.4*  CL 112* 110  CO2 19* 18*  BUN 63* 57*  CREATININE 1.82* 1.47*  GLUCOSE 115* 126*   No results for input(s): LABPT, INR in the last 72 hours.   Assessment/Plan: 1 Day Post-Op Procedure(s) (LRB): TOTAL HIP ARTHROPLASTY ANTERIOR APPROACH (Left)  Advance diet Up with therapy Hgb 6.6, receiving 1 unit PRBC at bedside Will recheck H/H post transfusion  Netta Cedars 12/04/2020, 10:21 PM

## 2020-12-04 NOTE — Progress Notes (Signed)
Physical Therapy Treatment Patient Details Name: Eileen Wright MRN: [id number] DOB: [birth year] Today's Date: 12/04/2020   History of Present Illness Patient is 80 y.o. female s/p Lt THA anterior approach on 12/03/20 with PMH significant for anemia, OA, COPD, GERD, HLD, HTN, hypotyroidism.    PT Comments    Pt continues very cooperative but limited this am by increased fatigue, pain and need for [facility].    Recommendations for follow up therapy are one component of a multi-disciplinary discharge planning process, led by the attending physician.  Recommendations may be updated based on patient status, additional functional criteria and insurance authorization.  Follow Up Recommendations  Home health PT     Assistance Recommended at Discharge Frequent or constant Supervision/Assistance  Equipment Recommendations  None recommended by PT    Recommendations for Other Services       Precautions / Restrictions Precautions Precautions: Fall Restrictions Weight Bearing Restrictions: No Other Position/Activity Restrictions: WBAT     Mobility  Bed Mobility Overal bed mobility: Needs Assistance Bed Mobility: Supine to Sit     Supine to sit: Mod assist     General bed mobility comments: Increased time with assist to bring Bil LE's off EOB and to raise trunk upright.    Transfers Overall transfer level: Needs assistance Equipment used: Rolling walker (2 wheels) Transfers: Sit to/from Stand;Bed to chair/wheelchair/BSC Sit to Stand: Min assist;+2 physical assistance;+2 safety/equipment;From elevated surface Stand pivot transfers: Min assist;Mod assist;+2 physical assistance;+2 safety/equipment         General transfer comment: cues for hand placement, pt using bil UE to power up and min assist to bring wt up and fwd  with hand transition to RW.  Stand pvt with RW bed to [facility] - pt struggling to advance either LE and taking hands off RW to reach out for bed and BSC to steady     Ambulation/Gait               General Gait Details: Stand pvt bed to [facility] only   Stairs             Wheelchair Mobility    Modified Rankin (Stroke Patients Only)       Balance Overall balance assessment: Needs assistance Sitting-balance support: Feet supported Sitting balance-Leahy Scale: Fair     Standing balance support: Reliant on assistive device for balance;During functional activity;Bilateral upper extremity supported Standing balance-Leahy Scale: Poor                              Cognition Arousal/Alertness: Awake/alert Behavior During Therapy: WFL for tasks assessed/performed Overall Cognitive Status: Within Functional Limits for tasks assessed                                          Exercises      General Comments        Pertinent Vitals/Pain Pain Assessment: 0-10 Pain Score: 5  Pain Location: Lt hip Pain Descriptors / Indicators: Aching;Sore Pain Intervention(s): Limited activity within patient's tolerance;Monitored during session;Premedicated before session;Ice applied    Home Living                          Prior Function            PT Goals (current goals can now be found in the  care plan section) Acute Rehab PT Goals Patient Stated Goal: recover independence PT Goal Formulation: With patient/family Time For Goal Achievement: 12/10/20 Potential to Achieve Goals: Good Progress towards PT goals: Not progressing toward goals - comment (limited by increased fatigue and pain)    Frequency    7X/week      PT Plan Current plan remains appropriate    Co-evaluation              AM-PAC PT "6 Clicks" Mobility   Outcome Measure  Help needed turning from your back to your side while in a flat bed without using bedrails?: A Little Help needed moving from lying on your back to sitting on the side of a flat bed without using bedrails?: A Lot Help needed moving to and from a bed to a  chair (including a wheelchair)?: A Lot Help needed standing up from a chair using your arms (e.g., wheelchair or bedside chair)?: A Lot Help needed to walk in hospital room?: A Lot Help needed climbing 3-5 steps with a railing? : Total 6 Click Score: 12    End of Session Equipment Utilized During Treatment: Gait belt Activity Tolerance: Patient limited by fatigue Patient left: Other (comment) (BSC with RN) Nurse Communication: Mobility status PT Visit Diagnosis: Muscle weakness (generalized) (M62.81);Difficulty in walking, not elsewhere classified (R26.2)     Time: 3790-2409 PT Time Calculation (min) (ACUTE ONLY): 10 min  Charges:  $Therapeutic Activity: 8-22 mins                     Mauro Kaufmann PT Acute Rehabilitation Services Pager [phone] Office [phone]    Quanta Robertshaw 12/04/2020, 11:58 AM

## 2020-12-04 NOTE — Progress Notes (Signed)
Physical Therapy Treatment Patient Details Name: Eileen Wright MRN: [id number] DOB: [birth year] Today's Date: 12/04/2020   History of Present Illness Patient is 80 y.o. female s/p Lt THA anterior approach on 12/03/20 with PMH significant for anemia, OA, COPD, GERD, HLD, HTN, hypotyroidism.    PT Comments    Pt continues very cooperative but limited by increased pain and fatigue.  Pt assisted up for [facility] and completed only partial turn before stating "I need to sit", chair pulled up behind pt and pt returned safely to sitting.  Pt denies dizziness but reports ++fatigue.  RN aware.   Recommendations for follow up therapy are one component of a multi-disciplinary discharge planning process, led by the attending physician.  Recommendations may be updated based on patient status, additional functional criteria and insurance authorization.  Follow Up Recommendations  Home health PT     Assistance Recommended at Discharge Frequent or constant Supervision/Assistance  Equipment Recommendations  None recommended by PT    Recommendations for Other Services       Precautions / Restrictions Precautions Precautions: Fall Restrictions Weight Bearing Restrictions: No Other Position/Activity Restrictions: WBAT     Mobility  Bed Mobility Overal bed mobility: Needs Assistance Bed Mobility: Supine to Sit     Supine to sit: Mod assist     General bed mobility comments: Increased time with assist to bring Bil LE's off EOB and to raise trunk upright.    Transfers Overall transfer level: Needs assistance Equipment used: Rolling walker (2 wheels) Transfers: Sit to/from Stand;Bed to chair/wheelchair/BSC Sit to Stand: From elevated surface;Mod assist Stand pivot transfers: Mod assist;From elevated surface         General transfer comment: cues for hand placement, pt using bil UE to power up and min/mod assist to bring wt up and fwd  with hand transition to RW.  Stand pvt with RW BSC to  recliner - pt struggling to advance either LE and taking hands off RW to reach out for bed and BSC to steady.  Pt unable to complete turn and with increasing instability, chair pulled behind pt to return to sitting    Ambulation/Gait               General Gait Details: BSC to recliner only   Stairs             Wheelchair Mobility    Modified Rankin (Stroke Patients Only)       Balance Overall balance assessment: Needs assistance Sitting-balance support: Feet supported Sitting balance-Leahy Scale: Fair     Standing balance support: Reliant on assistive device for balance;During functional activity;Bilateral upper extremity supported Standing balance-Leahy Scale: Poor                              Cognition Arousal/Alertness: Awake/alert Behavior During Therapy: WFL for tasks assessed/performed Overall Cognitive Status: Within Functional Limits for tasks assessed                                          Exercises Total Joint Exercises Ankle Circles/Pumps: AROM;Both;20 reps;Seated Quad Sets: AROM;Both;10 reps;Supine Heel Slides: AAROM;Left;15 reps;Supine Hip ABduction/ADduction: AAROM;Left;15 reps;Supine    General Comments        Pertinent Vitals/Pain Pain Assessment: 0-10 Pain Score: 5  Pain Location: Lt hip Pain Descriptors / Indicators: Aching;Sore Pain Intervention(s): Limited activity within  patient's tolerance;Monitored during session;Premedicated before session;Ice applied    Home Living                          Prior Function            PT Goals (current goals can now be found in the care plan section) Acute Rehab PT Goals Patient Stated Goal: recover independence PT Goal Formulation: With patient/family Time For Goal Achievement: 12/10/20 Potential to Achieve Goals: Good Progress towards PT goals: Not progressing toward goals - comment (continues ltd by pain/fatigue)    Frequency     7X/week      PT Plan Current plan remains appropriate    Co-evaluation              AM-PAC PT "6 Clicks" Mobility   Outcome Measure  Help needed turning from your back to your side while in a flat bed without using bedrails?: A Lot Help needed moving from lying on your back to sitting on the side of a flat bed without using bedrails?: A Lot Help needed moving to and from a bed to a chair (including a wheelchair)?: A Lot Help needed standing up from a chair using your arms (e.g., wheelchair or bedside chair)?: A Lot Help needed to walk in hospital room?: A Lot Help needed climbing 3-5 steps with a railing? : Total 6 Click Score: 11    End of Session Equipment Utilized During Treatment: Gait belt Activity Tolerance: Patient limited by fatigue Patient left: in chair;with call bell/phone within reach;with chair alarm set Nurse Communication: Mobility status PT Visit Diagnosis: Muscle weakness (generalized) (M62.81);Difficulty in walking, not elsewhere classified (R26.2)     Time: 9702-6378 PT Time Calculation (min) (ACUTE ONLY): 32 min  Charges:  $Therapeutic Exercise: 8-22 mins $Therapeutic Activity: 8-22 mins                     Mauro Kaufmann PT Acute Rehabilitation Services Pager [phone] Office [phone]    Shanterria Franta 12/04/2020, 12:04 PM

## 2020-12-04 NOTE — Discharge Summary (Signed)
Physician Discharge Summary  Patient ID: EARNESTINE CIANO MRN: [id number] DOB/AGE: 80-Feb-1942 80 y.o.  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 female who was admitted 12/03/2020 with a diagnosis of Acute blood loss anemia and went to the operating room on 12/03/2020 and underwent the above named procedures.    She was given perioperative antibiotics:  Anti-infectives (From admission, onward)    Start     Dose/Rate Route Frequency Ordered Stop   12/03/20 1500  ceFAZolin (ANCEF) IVPB 2g/100 mL premix        2 g 200 mL/hr over 30 Minutes Intravenous Every 6 hours 12/03/20 1420 12/03/20 2135   12/03/20 0600  ceFAZolin (ANCEF) IVPB 2g/100 mL premix        2 g 200 mL/hr over 30 Minutes Intravenous On call to O.R. 12/03/20 [id number] 12/03/20 0745     .  She was given sequential compression devices, early ambulation, and aspirin for DVT prophylaxis.  She benefited maximally from the hospital stay. The patient did have asymptomatic blood loss anemia and  received a unit of packed red blood cells. The hospitalist was consulted and prescribed an iron supplement.   Recent vital signs:  Vitals:   12/06/20 2048 12/07/20 0649  BP: (!) 135/56 112/61  Pulse: (!) 107 99  Resp: 18 16  Temp: 98.4 F (36.9 C) 98.1 F (36.7 C)  SpO2:  94%    Recent laboratory studies:  Lab Results  Component Value Date   HGB 8.2 (L) 12/06/2020   HGB 8.4 (L) 12/05/2020   HGB 6.6 (LL) 12/05/2020   Lab Results  Component Value Date   WBC 13.1 (H) 12/06/2020   PLT 252 12/06/2020   Lab Results  Component Value Date   INR 1.0 11/23/2020   Lab Results  Component Value Date   NA 134 (L) 12/07/2020   K 5.1 12/07/2020   CL 109 12/07/2020   CO2 21 (L) 12/07/2020   BUN 49 (H) 12/07/2020   CREATININE 1.34 (H) 12/07/2020   GLUCOSE 100 (H) 12/07/2020     WEIGHT BEARING   Weight bearing as tolerated with assist device (walker, cane, etc) as directed, use it as long as suggested by your surgeon or therapist, typically at least 4-6 weeks.   EXERCISES  Results after joint replacement surgery are often greatly improved when you follow the exercise, range of motion and muscle strengthening exercises prescribed by your doctor. Safety measures are also important to protect the joint from further injury. Any time any of  these exercises cause you to have increased pain or swelling, decrease what you are doing until you are comfortable again and then slowly increase them. If you have problems or questions, call your caregiver or physical therapist for advice.   Rehabilitation is important following a joint replacement. After just a few days of immobilization, the muscles of the leg can become weakened and shrink (atrophy).  These exercises are designed to build up the tone and strength of the thigh and leg muscles and to improve motion. Often times heat used for twenty to thirty minutes before working out will loosen up your tissues and help with improving the range of  motion but do not use heat for the first two weeks following surgery (sometimes heat can increase post-operative swelling).   These exercises can be done on a training (exercise) mat, on the floor, on a table or on a bed. Use whatever works the best and is most comfortable for you.    Use music or television while you are exercising so that the exercises are a pleasant break in your day. This will make your life better with the exercises acting as a break in your routine that you can look forward to.   Perform all exercises about fifteen times, three times per day or as directed.  You should exercise both the operative leg and the other leg as well.  Exercises include:   Quad Sets - Tighten up the muscle on the front of the thigh (Quad) and hold for 5-10 seconds.   Straight Leg Raises - With your knee straight (if you were given a brace, keep it on), lift the leg to 60 degrees, hold for 3 seconds, and slowly lower the leg.  Perform this exercise against resistance later as your leg gets stronger.  Leg Slides: Lying on your back, slowly slide your foot toward your buttocks, bending your knee up off the floor (only go as far as is comfortable). Then slowly slide your foot back down until your leg is flat on the floor again.  Angel Wings: Lying on your back spread your legs to the side as far apart as you can without causing discomfort.  Hamstring Strength:  Lying on your back, push your heel against the floor with your leg straight by tightening up the muscles of your buttocks.  Repeat, but this time bend your knee to a comfortable angle, and push your heel against the floor.  You may put a pillow under the heel to make it more comfortable if necessary.   A rehabilitation program following joint replacement surgery can speed recovery and prevent re-injury in the future due to weakened muscles. Contact your doctor or a physical therapist for more information on knee rehabilitation.     CONSTIPATION  Constipation is defined medically as fewer than three stools per week and severe constipation as less than one stool per week.  Even if you have a regular bowel pattern at home, your normal regimen is likely to be disrupted due to multiple reasons following surgery.  Combination of anesthesia, postoperative narcotics, change in appetite and fluid intake all can affect your bowels.   YOU MUST use at least one of the following options; they are listed in order of increasing strength to get the job done.  They are all available over the counter, and you may need to use some, POSSIBLY even all of these options:    Drink plenty of fluids (prune juice may be helpful) and high fiber  foods Colace 100 mg by mouth twice a day  Senokot for constipation as directed and as needed Dulcolax (bisacodyl), take with full glass of water  Miralax (polyethylene glycol) once or twice a day as needed.  If you have tried all these things and are unable to have a bowel movement in the first 3-4 days after surgery call either your surgeon or your primary doctor.    If you experience loose stools or diarrhea, hold the medications until you stool forms back up.  If your symptoms do not get better within 1 week or if they get worse, check with your doctor.  If you experience "the worst abdominal pain ever" or develop nausea or vomiting, please contact the office immediately for further recommendations for treatment.  POST-OPERATIVE OPIOID TAPER INSTRUCTIONS: It is important to wean off of your opioid medication as soon as possible. If you do not need pain medication after your surgery it is ok to stop day one. Opioids include: Codeine, Hydrocodone(Norco, Vicodin), Oxycodone(Percocet, oxycontin) and hydromorphone amongst others.  Long term and even short term use of opiods can cause: Increased pain response Dependence Constipation Depression Respiratory depression And more.  Withdrawal symptoms can  include Flu like symptoms Nausea, vomiting And more Techniques to manage these symptoms Hydrate well Eat regular healthy meals Stay active Use relaxation techniques(deep breathing, meditating, yoga) Do Not substitute Alcohol to help with tapering If you have been on opioids for less than two weeks and do not have pain than it is ok to stop all together.  Plan to wean off of opioids This plan should start within one week post op of your joint replacement. Maintain the same interval or time between taking each dose and first decrease the dose.  Cut the total daily intake of opioids by one tablet each day Next start to increase the time between doses. The last dose that should be eliminated is the evening dose.    Dental Antibiotics:  In most cases prophylactic antibiotics for Dental procdeures after total joint surgery are not necessary.  Exceptions are as follows:  1. History of prior total joint infection  2. Severely immunocompromised (Organ Transplant, cancer chemotherapy, Rheumatoid biologic meds such as [place])  3. Poorly controlled diabetes (A1C &gt; 8.0, blood glucose over 200)  If you have one of these conditions, contact your surgeon for an antibiotic prescription, prior to your dental procedure.  ITCHING:  If you experience itching with your medications, try taking only a single pain pill, or even half a pain pill at a time.  You can also use Benadryl over the counter for itching or also to help with sleep.   TED HOSE STOCKINGS:  Use stockings on both legs until for at least 2 weeks or as directed by physician office. They may be removed at night for sleeping.  MEDICATIONS:  See your medication summary on the "After Visit Summary" that nursing will review with you.  You may have some home medications which will be placed on hold until you complete the course of blood thinner medication.  It is important for you to complete the blood thinner medication as  prescribed.  PRECAUTIONS:  If you experience chest pain or shortness of breath - call 911 immediately for transfer to the hospital emergency department.   If you develop a fever greater that 101 F, purulent drainage from wound, increased redness or drainage from wound, foul odor from the wound/dressing, or calf pain - CONTACT YOUR SURGEON.  FOLLOW-UP APPOINTMENTS:  If you do not already have a post-op appointment, please call the office for an appointment to be seen by your surgeon.  Guidelines for how soon to be seen are listed in your "After Visit Summary", but are typically between 1-4 weeks after surgery.  OTHER INSTRUCTIONS:   Knee Replacement:  Do not place pillow under knee, focus on keeping the knee straight while resting. CPM instructions: 0-90 degrees, 2 hours in the morning, 2 hours in the afternoon, and 2 hours in the evening. Place foam block, curve side up under heel at all times except when in CPM or when walking.  DO NOT modify, tear, cut, or change the foam block in any way.   MAKE SURE YOU:  Understand these instructions.  Get help right away if you are not doing well or get worse.    Thank you for letting us be a part of your medical care team.  It is a privilege we respect greatly.  We hope these instructions will help you stay on track for a fast and full recovery!   Diagnostic Studies: DG Pelvis Portable  Result Date: 12/03/2020 CLINICAL DATA:  Postop left hip EXAM: PORTABLE PELVIS 1-2 VIEWS COMPARISON:  None. FINDINGS: There is a left hip arthroplasty in normal alignment without evidence of loosening or periprosthetic fracture. Expected soft tissue changes. Mild right hip osteoarthritis. IMPRESSION: Left hip arthroplasty without evidence of immediate hardware complication on single frontal view. Electronically Signed   By: Maurine Simmering M.D.   On: 12/03/2020 10:16   DG C-Arm 1-60 Min-No Report  Result Date:  12/03/2020 Fluoroscopy was utilized by the requesting physician.  No radiographic interpretation.   DG HIP UNILAT WITH PELVIS 1V LEFT  Result Date: 12/03/2020 CLINICAL DATA:  Provided history: Surgery, elective. Additional history provided: Operative left anterior hip replacement. EXAM: DG HIP (WITH OR WITHOUT PELVIS) 1V*L* COMPARISON:  Subsequently performed single view pelvic radiograph 12/03/2020. FINDINGS: Four intraoperative fluoroscopic images of the left hip are submitted. On the provided images, there are findings of interval left total hip arthroplasty. The femoral and acetabular components appear well-seated. No unexpected finding on the provided views. IMPRESSION: 4 intraoperative fluoroscopic images from left total hip arthroplasty, as described. Electronically Signed   By: Kellie Simmering D.O.   On: 12/03/2020 10:40    Disposition: Discharge disposition: 01-Home or Self Care      Discharge Instructions     Call MD / Call 911   Complete by: As directed    If you experience chest pain or shortness of breath, CALL 911 and be transported to the hospital emergency room.  If you develope a fever above 101 F, pus (white drainage) or increased drainage or redness at the wound, or calf pain, call your surgeon's office.   Constipation Prevention   Complete by: As directed    Drink plenty of fluids.  Prune juice may be helpful.  You may use a stool softener, such as Colace (over the counter) 100 mg twice a day.  Use MiraLax (over the counter) for constipation as needed.   Diet - low sodium heart healthy   Complete by: As directed    Driving restrictions   Complete by: As directed    No driving for 6 weeks   Increase activity slowly as tolerated   Complete by: As directed    Lifting restrictions   Complete by: As directed    No lifting for 6 weeks   Post-operative opioid taper instructions:  Complete by: As directed    POST-OPERATIVE OPIOID TAPER INSTRUCTIONS: It is important to wean  off of your opioid medication as soon as possible. If you do not need pain medication after your surgery it is ok to stop day one. Opioids include: Codeine, Hydrocodone(Norco, Vicodin), Oxycodone(Percocet, oxycontin) and hydromorphone amongst others.  Long term and even short term use of opiods can cause: Increased pain response Dependence Constipation Depression Respiratory depression And more.  Withdrawal symptoms can include Flu like symptoms Nausea, vomiting And more Techniques to manage these symptoms Hydrate well Eat regular healthy meals Stay active Use relaxation techniques(deep breathing, meditating, yoga) Do Not substitute Alcohol to help with tapering If you have been on opioids for less than two weeks and do not have pain than it is ok to stop all together.  Plan to wean off of opioids This plan should start within one week post op of your joint replacement. Maintain the same interval or time between taking each dose and first decrease the dose.  Cut the total daily intake of opioids by one tablet each day Next start to increase the time between doses. The last dose that should be eliminated is the evening dose.      TED hose   Complete by: As directed    Use stockings (TED hose) for 2 weeks on both leg(s).  You may remove them at night for sleeping.      Allergies as of 12/07/2020       Reactions   Epinephrine Hives   Vancomycin Hives   Penicillins    Redness around injection site        Medication List     TAKE these medications    acetaminophen 325 MG tablet Commonly known as: TYLENOL Take 1-2 tablets (325-650 mg total) by mouth every 6 (six) hours as needed for mild pain (pain score 1-3 or temp > 100.5).   albuterol 108 (90 Base) MCG/ACT inhaler Commonly known as: VENTOLIN HFA Inhale 1-2 puffs into the lungs every 6 (six) hours as needed for shortness of breath.   aspirin 81 MG chewable tablet Chew 1 tablet (81 mg total) by mouth 2 (two)  times daily.   atorvastatin 10 MG tablet Commonly known as: LIPITOR Take 10 mg by mouth daily.   cetirizine 10 MG tablet Commonly known as: ZYRTEC Take 10 mg by mouth daily.   docusate sodium 100 MG capsule Commonly known as: COLACE Take 1 capsule (100 mg total) by mouth 2 (two) times daily.   ferrous sulfate 325 (65 FE) MG tablet Take 1 tablet (325 mg total) by mouth 2 (two) times daily with a meal.   fluticasone-salmeterol 500-50 MCG/ACT Aepb Commonly known as: ADVAIR Inhale 1 puff into the lungs in the morning and at bedtime.   HYDROcodone-acetaminophen 5-325 MG tablet Commonly known as: NORCO/VICODIN Take 1-2 tablets by mouth every 4 (four) hours as needed for moderate pain (pain score 4-6).   lisinopril-hydrochlorothiazide 10-12.5 MG tablet Commonly known as: ZESTORETIC Take 1 tablet by mouth daily.   magnesium oxide 400 (240 Mg) MG tablet Commonly known as: MAG-OX Take 400 mg by mouth daily.   metoprolol succinate 25 MG 24 hr tablet Commonly known as: TOPROL-XL Take 25 mg by mouth daily.   Multi-Vitamins Tabs Take 1 tablet by mouth daily.   omeprazole 20 MG capsule Commonly known as: PRILOSEC Take 20 mg by mouth daily.   ondansetron 4 MG tablet Commonly known as: ZOFRAN Take 1 tablet (4 mg total) by  mouth every 6 (six) hours as needed for nausea.   senna 8.6 MG Tabs tablet Commonly known as: SENOKOT Take 1 tablet (8.6 mg total) by mouth 2 (two) times daily.   Spiriva HandiHaler 18 MCG inhalation capsule Generic drug: tiotropium Place 18 mcg into inhaler and inhale daily.   spironolactone 25 MG tablet Commonly known as: ALDACTONE Take 25 mg by mouth daily.   Synthroid 125 MCG tablet Generic drug: levothyroxine Take 125 mcg by mouth every Monday, Wednesday, and Friday.   Synthroid 137 MCG tablet Generic drug: levothyroxine Take 137 mcg by mouth every Tuesday, Thursday, Saturday, and Sunday.   torsemide 20 MG tablet Commonly known as:  DEMADEX Take 20 mg by mouth daily.   Vitamin D3 125 MCG (5000 UT) Caps Take 5,000 Units by mouth daily.               Durable Medical Equipment  (From admission, onward)           Start     Ordered   12/06/20 1312  For home use only DME Walker youth  Once       Question:  Patient needs a walker to treat with the following condition  Answer:  History of left hip replacement   12/06/20 1314              Follow-up Information     Swinteck, Aaron Edelman, MD. Schedule an appointment as soon as possible for a visit in 2 week(s).   Specialty: Orthopedic Surgery Why: For suture removal Contact information: [address] [place] [address] [phone]                  Signed: Dorothyann Peng 12/07/2020, 12:15 PM

## 2020-12-04 NOTE — TOC Transition Note (Signed)
Transition of Care [facility]) - CM/SW Discharge Note  Patient Details  Name: Eileen Wright MRN: [id number] Date of Birth: [birth year]  [facility] CM/SW Contact:  Sherie Don, LCSW Phone Number: 12/04/2020, 10:46 AM  Clinical Narrative: Patient is expected to discharge home after working with PT. CSW met with patient to review discharge plan. Patient will discharge home with a home exercise program (HEP). Patient has a shower chair, rolling walker, and elevated toilets at home so there are no DME needs at this time. TOC signing off.  Final next level of care: Home/Self Care Barriers to Discharge: No Barriers Identified  Patient Goals and CMS Choice Patient states their goals for this hospitalization and ongoing recovery are:: Discharge home with HEP Choice offered to / list presented to : NA  Discharge Plan and Services         DME Arranged: N/A DME Agency: NA  Readmission Risk Interventions No flowsheet data found.

## 2020-12-05 ENCOUNTER — Encounter (HOSPITAL_COMMUNITY): Payer: Self-pay | Admitting: Orthopedic Surgery

## 2020-12-05 DIAGNOSIS — D62 Acute posthemorrhagic anemia: Secondary | ICD-10-CM | POA: Diagnosis present

## 2020-12-05 DIAGNOSIS — M1612 Unilateral primary osteoarthritis, left hip: Secondary | ICD-10-CM | POA: Diagnosis present

## 2020-12-05 DIAGNOSIS — E039 Hypothyroidism, unspecified: Secondary | ICD-10-CM | POA: Diagnosis present

## 2020-12-05 DIAGNOSIS — Z881 Allergy status to other antibiotic agents status: Secondary | ICD-10-CM | POA: Diagnosis not present

## 2020-12-05 DIAGNOSIS — I10 Essential (primary) hypertension: Secondary | ICD-10-CM | POA: Diagnosis present

## 2020-12-05 DIAGNOSIS — E785 Hyperlipidemia, unspecified: Secondary | ICD-10-CM | POA: Diagnosis present

## 2020-12-05 DIAGNOSIS — Z96641 Presence of right artificial hip joint: Secondary | ICD-10-CM | POA: Diagnosis present

## 2020-12-05 DIAGNOSIS — E875 Hyperkalemia: Secondary | ICD-10-CM | POA: Diagnosis present

## 2020-12-05 DIAGNOSIS — Z87891 Personal history of nicotine dependence: Secondary | ICD-10-CM | POA: Diagnosis not present

## 2020-12-05 DIAGNOSIS — N1832 Chronic kidney disease, stage 3b: Secondary | ICD-10-CM | POA: Diagnosis present

## 2020-12-05 DIAGNOSIS — Z8701 Personal history of pneumonia (recurrent): Secondary | ICD-10-CM | POA: Diagnosis not present

## 2020-12-05 DIAGNOSIS — J449 Chronic obstructive pulmonary disease, unspecified: Secondary | ICD-10-CM | POA: Diagnosis present

## 2020-12-05 DIAGNOSIS — Z79899 Other long term (current) drug therapy: Secondary | ICD-10-CM | POA: Diagnosis not present

## 2020-12-05 DIAGNOSIS — I129 Hypertensive chronic kidney disease with stage 1 through stage 4 chronic kidney disease, or unspecified chronic kidney disease: Secondary | ICD-10-CM | POA: Diagnosis present

## 2020-12-05 DIAGNOSIS — I429 Cardiomyopathy, unspecified: Secondary | ICD-10-CM | POA: Diagnosis present

## 2020-12-05 DIAGNOSIS — Z888 Allergy status to other drugs, medicaments and biological substances status: Secondary | ICD-10-CM | POA: Diagnosis not present

## 2020-12-05 DIAGNOSIS — Z20822 Contact with and (suspected) exposure to covid-19: Secondary | ICD-10-CM | POA: Diagnosis present

## 2020-12-05 DIAGNOSIS — Z9181 History of falling: Secondary | ICD-10-CM | POA: Diagnosis not present

## 2020-12-05 DIAGNOSIS — K219 Gastro-esophageal reflux disease without esophagitis: Secondary | ICD-10-CM | POA: Diagnosis present

## 2020-12-05 DIAGNOSIS — Z880 Allergy status to penicillin: Secondary | ICD-10-CM | POA: Diagnosis not present

## 2020-12-05 DIAGNOSIS — I340 Nonrheumatic mitral (valve) insufficiency: Secondary | ICD-10-CM | POA: Diagnosis present

## 2020-12-05 DIAGNOSIS — Z8711 Personal history of peptic ulcer disease: Secondary | ICD-10-CM | POA: Diagnosis not present

## 2020-12-05 LAB — CBC
HCT: 20 % — ABNORMAL LOW (ref 36.0–46.0)
Hemoglobin: 6.6 g/dL — CL (ref 12.0–15.0)
MCH: 31.4 pg (ref 26.0–34.0)
MCHC: 33 g/dL (ref 30.0–36.0)
MCV: 95.2 fL (ref 80.0–100.0)
Platelets: 236 10*3/uL (ref 150–400)
RBC: 2.1 MIL/uL — ABNORMAL LOW (ref 3.87–5.11)
RDW: 13.2 % (ref 11.5–15.5)
WBC: 12.8 10*3/uL — ABNORMAL HIGH (ref 4.0–10.5)
nRBC: 0 % (ref 0.0–0.2)

## 2020-12-05 LAB — BASIC METABOLIC PANEL
Anion gap: 3 — ABNORMAL LOW (ref 5–15)
BUN: 60 mg/dL — ABNORMAL HIGH (ref 8–23)
CO2: 20 mmol/L — ABNORMAL LOW (ref 22–32)
Calcium: 8.6 mg/dL — ABNORMAL LOW (ref 8.9–10.3)
Chloride: 111 mmol/L (ref 98–111)
Creatinine, Ser: 1.69 mg/dL — ABNORMAL HIGH (ref 0.44–1.00)
GFR, Estimated: 30 mL/min — ABNORMAL LOW (ref >60)
Glucose, Bld: 122 mg/dL — ABNORMAL HIGH (ref 70–99)
Potassium: 5.5 mmol/L — ABNORMAL HIGH (ref 3.5–5.1)
Sodium: 134 mmol/L — ABNORMAL LOW (ref 135–145)

## 2020-12-05 LAB — HEMOGLOBIN AND HEMATOCRIT, BLOOD
HCT: 25.7 % — ABNORMAL LOW (ref 36.0–46.0)
Hemoglobin: 8.4 g/dL — ABNORMAL LOW (ref 12.0–15.0)

## 2020-12-05 LAB — PREPARE RBC (CROSSMATCH)

## 2020-12-05 MED ORDER — SODIUM ZIRCONIUM CYCLOSILICATE 10 G PO PACK
10.0000 g | PACK | Freq: Every day | ORAL | Status: AC
  Administered 2020-12-05: 10 g via ORAL
  Filled 2020-12-05: qty 1

## 2020-12-05 MED ORDER — SODIUM CHLORIDE 0.9% IV SOLUTION: Freq: Once | INTRAVENOUS | Status: AC

## 2020-12-05 NOTE — Consult Note (Signed)
Medical Consultation   Eileen Wright  ZOX:[id number]  DOB: [birth year]  DOA: 12/03/2020  PCP: Nicholaus Bloom, MD   Outpatient Specialists:  Requesting physician: Dr. Odis Hollingshead.   Reason for consultation: Acute blood loss anemia.   History of Present Illness: Eileen Wright is an 80 y.o. female with chronic blood loss anemia, osteoarthritis unspecified cardiomyopathy, GERD, heart murmur, hyperlipidemia, hypertension, hypothyroidism, mitral valve insufficiency, history of pneumonia, history of nonbleeding PUD who underwent a right hip arthroplasty and we has been asked to evaluate due to postoperative anemia.  She denied fever, chills, sore throat, rhinorrhea, dyspnea, chest pain, palpitations, diaphoresis, PND or recent lower extremity pitting edema.  No abdominal pain, nausea, emesis/hematemesis, diarrhea, melena or hematochezia.  No flank pain, dysuria, frequency or hematuria.  No polyuria, polydipsia, polyphagia or blurred vision.  Review of Systems:  ROS As per HPI otherwise 10 point review of systems negative.   Past Medical History: Past Medical History:  Diagnosis Date   Anemia    Arthritis    Cardiomyopathy (HCC)    COPD (chronic obstructive pulmonary disease) (HCC)    GERD (gastroesophageal reflux disease)    Heart murmur    Hyperlipemia    Hypertension    Hypothyroidism    Mitral valve insufficiency    Pneumonia    Past Surgical History: Past Surgical History:  Procedure Laterality Date   APPENDECTOMY     BREAST BIOPSY     TONSILLECTOMY     adenoids   Allergies:   Allergies  Allergen Reactions   Epinephrine Hives   Vancomycin Hives   Penicillins     Redness around injection site   Social History:  reports that she has quit smoking. Her smoking use included cigarettes. She has never used smokeless tobacco. She reports that she does not drink alcohol and does not use drugs.  Family History: History reviewed. No pertinent family  history.  Physical Exam: Vitals:   12/05/20 0750 12/05/20 0856 12/05/20 0859 12/05/20 0914  BP:  (!) 86/39  (!) 96/41  Pulse:  99  (!) 102  Resp:  16  16  Temp:  98.4 F (36.9 C)  98.4 F (36.9 C)  TempSrc:   Oral Oral  SpO2: 99% 98%  100%  Weight:      Height:       Constitutional:  Alert and awake, oriented x3, not in any acute distress. Eyes: PERLA, EOMI, irises appear normal, anicteric sclera,  ENMT: external ears and nose appear normal,             Lips appears normal, oropharynx mucosa, tongue, posterior pharynx appear normal  Neck: neck appears normal, no masses, normal ROM, no thyromegaly, no JVD  CVS: S1-S2 clear, no murmur rubs or gallops, no LE edema, normal pedal pulses  Respiratory:  clear to auscultation bilaterally, no wheezing, rales or rhonchi. Respiratory effort normal. No accessory muscle use.  Abdomen: soft nontender, nondistended, normal bowel sounds, no hepatosplenomegaly, no hernias  Musculoskeletal: : no cyanosis, clubbing or edema noted bilaterally Neuro: Cranial nerves II-XII intact, strength, sensation, reflexes Psych: judgement and insight appear normal, stable mood and affect, mental status Skin: no rashes or lesions or ulcers, no induration or nodules   Data reviewed:  I have personally reviewed following labs and imaging studies Labs:  CBC: Recent Labs  Lab 12/04/20 0357 12/05/20 0421  WBC 8.9 12.8*  HGB 7.1* 6.6*  HCT 22.1*  20.0*  MCV 96.5 95.2  PLT 233 [id number]   Basic Metabolic Panel: Recent Labs  Lab 12/04/20 0357 12/04/20 1253 12/05/20 0421  NA 136 135 134*  K 5.1 5.4* 5.5*  CL 112* 110 111  CO2 19* 18* 20*  GLUCOSE 115* 126* 122*  BUN 63* 57* 60*  CREATININE 1.82* 1.47* 1.69*  CALCIUM 8.5* 9.0 8.6*   GFR Estimated Creatinine Clearance: 24.1 mL/min (A) (by C-G formula based on SCr of 1.69 mg/dL (H)). Liver Function Tests: No results for input(s): AST, ALT, ALKPHOS, BILITOT, PROT, ALBUMIN in the last 168 hours. No results for  input(s): LIPASE, AMYLASE in the last 168 hours. No results for input(s): AMMONIA in the last 168 hours. Coagulation profile No results for input(s): INR, PROTIME in the last 168 hours.  Cardiac Enzymes: No results for input(s): CKTOTAL, CKMB, CKMBINDEX, TROPONINI in the last 168 hours. BNP: Invalid input(s): POCBNP CBG: No results for input(s): GLUCAP in the last 168 hours. D-Dimer No results for input(s): DDIMER in the last 72 hours. Hgb A1c No results for input(s): HGBA1C in the last 72 hours. Lipid Profile No results for input(s): CHOL, HDL, LDLCALC, TRIG, CHOLHDL, LDLDIRECT in the last 72 hours. Thyroid function studies No results for input(s): TSH, T4TOTAL, T3FREE, THYROIDAB in the last 72 hours.  Invalid input(s): FREET3 Anemia work up No results for input(s): VITAMINB12, FOLATE, FERRITIN, TIBC, IRON, RETICCTPCT in the last 72 hours. Urinalysis No results found for: COLORURINE, APPEARANCEUR, [place], [place], GLUCOSEU, [place], [place], [place], PROTEINUR, UROBILINOGEN, NITRITE, LEUKOCYTESUR  Sepsis Labs Invalid input(s): PROCALCITONIN,  WBC,  LACTICIDVEN Microbiology Recent Results (from the past 240 hour(s))  SARS CORONAVIRUS 2 (TAT 6-24 HRS) Nasopharyngeal Nasopharyngeal Swab     Status: None   Collection Time: 12/01/20 10:41 AM   Specimen: Nasopharyngeal Swab  Result Value Ref Range Status   SARS Coronavirus 2 NEGATIVE NEGATIVE Final    Comment: (NOTE) SARS-CoV-2 target nucleic acids are NOT DETECTED.  The SARS-CoV-2 RNA is generally detectable in upper and lower respiratory specimens during the acute phase of infection. Negative results do not preclude SARS-CoV-2 infection, do not rule out co-infections with other pathogens, and should not be used as the sole basis for treatment or other patient management decisions. Negative results must be combined with clinical observations, patient history, and epidemiological information. The expected result is  Negative.  Fact Sheet for Patients: SugarRoll.be  Fact Sheet for Healthcare Providers: https://www.woods-mathews.com/  This test is not yet approved or cleared by the [place] FDA and  has been authorized for detection and/or diagnosis of SARS-CoV-2 by FDA under an Emergency Use Authorization (EUA). This EUA will remain  in effect (meaning this test can be used) for the duration of the COVID-19 declaration under Se ction 564(b)(1) of the Act, 21 U.S.C. section 360bbb-3(b)(1), unless the authorization is terminated or revoked sooner.  Performed at [facility], [place] [address], [place], [place] [address]    Inpatient Medications:   Scheduled Meds:  aspirin  81 mg Oral BID   atorvastatin  10 mg Oral Daily   cholecalciferol  5,000 Units Oral Daily   docusate sodium  100 mg Oral BID   levothyroxine  125 mcg Oral Q M,W,F   levothyroxine  137 mcg Oral Q T,Th,S,Su   loratadine  10 mg Oral Daily   magnesium oxide  400 mg Oral Daily   metoprolol succinate  25 mg Oral Daily   mometasone-formoterol  2 puff Inhalation BID   multivitamin with minerals  1 tablet Oral Daily   pantoprazole  40 mg Oral Daily   senna  1 tablet Oral BID   sodium zirconium cyclosilicate  10 g Oral Daily   torsemide  20 mg Oral Daily   umeclidinium bromide  1 puff Inhalation Daily   Continuous Infusions:  sodium chloride 150 mL/hr at 12/04/20 0506   methocarbamol (ROBAXIN) IV     sodium chloride     Radiological Exams on Admission: No results found.  Impression/Recommendations Principal Problem:   Acute blood loss anemia Will temporarily hold aspirin for a day or 2. Monitor hematocrit and hemoglobin. Transfuse as needed. Check stool occult blood. If positive consult gastroenterology.  Active Problems:   Primary osteoarthritis of left hip Continue postop care per orthopedic surgery    Hyperkalemia Hold lisinopril. Lokelma 10 mg p.o. x1  dose. Follow-up potassium level.    Hypothyroidism Continue levothyroxine 137 mcg p.o. daily.    COPD (chronic obstructive pulmonary disease) ([place]) Continue daily Advair or formulary equivalent. Continue daily Spiriva inhaler. Supplemental oxygen as needed. Short acting bronchodilators as needed.    Hypertension Continue metoprolol 25 mg p.o. daily. Hold lisinopril 10 mg p.o. daily. Hold HCTZ 12.5 mg p.o. daily. Hold torsemide 20 mg daily.    GERD (gastroesophageal reflux disease) Continue PPI.    Stage 3b chronic kidney disease (CKD) (HCC) Monitor renal function electrolytes.  Thank you for this consultation.  Our [facility] hospitalist team will follow the patient with you.   Time Spent: About 55 minutes  were spent during the process of this consultation.  Reubin Milan M.D. [facility] 12/05/2020, 10:49 AM  This document was prepared using Dragon voice recognition software and may contain some unintended transcription errors.

## 2020-12-05 NOTE — TOC Transition Note (Signed)
Transition of Care [facility]) - CM/SW Discharge Note   Patient Details  Name: Eileen Wright MRN: [id number] Date of Birth: [birth year]  [facility]) CM/SW Contact:  Darleene Cleaver, LCSW Phone Number: 12/05/2020, 11:51 AM   Clinical Narrative:     CSW spoke to patient and confirmed that she is not interested in receiving home health PT.  Per patient she has someone who will be staying with her 24/7.  CSW signing off, per weekday CSW patient has equipment at home already.   Final next level of care: Home/Self Care Barriers to Discharge: No Barriers Identified   Patient Goals and CMS Choice Patient states their goals for this hospitalization and ongoing recovery are:: Discharge home with HEP   Choice offered to / list presented to : NA  Discharge Placement                       Discharge Plan and Services                DME Arranged: N/A DME Agency: NA                  Social Determinants of Health (SDOH) Interventions     Readmission Risk Interventions No flowsheet data found.

## 2020-12-05 NOTE — Progress Notes (Signed)
   12/05/20 0914  Assess: MEWS Score  Temp 98.4 F (36.9 C)  BP (!) 96/41  Pulse Rate (!) 102  Resp 16  Level of Consciousness Alert  SpO2 100 %  O2 Device Room Air  Assess: MEWS Score  MEWS Temp 0  MEWS Systolic 1  MEWS Pulse 1  MEWS RR 0  MEWS LOC 0  MEWS Score 2  MEWS Score Color Yellow  Assess: if the MEWS score is Yellow or Red  Were vital signs taken at a resting state? Yes  Focused Assessment No change from prior assessment  Does the patient meet 2 or more of the SIRS criteria? Yes  Does the patient have a confirmed or suspected source of infection? No  MEWS guidelines implemented *See Row Information* Yes  Treat  MEWS Interventions Administered scheduled meds/treatments  Take Vital Signs  Increase Vital Sign Frequency  Yellow: Q 2hr X 2 then Q 4hr X 2, if remains yellow, continue Q 4hrs  Escalate  MEWS: Escalate Yellow: discuss with charge nurse/RN and consider discussing with provider and RRT  Notify: Charge Nurse/RN  Date Charge Nurse/RN Notified 12/05/20  Time Charge Nurse/RN Notified 0919  Notify: Provider  Provider Name/Title Odis Hollingshead  Date Provider Notified 12/05/20  Time Provider Notified 0920  Notification Type Page  Notification Reason Other (Comment) (yellow mews)  Provider response No new orders  Date of Provider Response 12/05/20  Document  Patient Outcome Not stable and remains on department  Progress note created (see row info) Yes  Assess: SIRS CRITERIA  SIRS Temperature  0  SIRS Pulse 1  SIRS Respirations  0  SIRS WBC 1  SIRS Score Sum  2

## 2020-12-05 NOTE — Plan of Care (Signed)
  Problem: Health Behavior/Discharge Planning: Goal: Ability to manage health-related needs will improve Outcome: Progressing   Problem: Coping: Goal: Level of anxiety will decrease Outcome: Progressing   Problem: Pain Managment: Goal: General experience of comfort will improve Outcome: Progressing   Problem: Safety: Goal: Ability to remain free from injury will improve Outcome: Progressing   

## 2020-12-05 NOTE — Progress Notes (Signed)
Physical Therapy Treatment Patient Details Name: Eileen Wright MRN: [id number] DOB: 06-07-1940 Today's Date: 12/05/2020   History of Present Illness Patient is 80 y.o. female s/p Lt THA anterior approach on 12/03/20 with PMH significant for anemia, OA, COPD, GERD, HLD, HTN, hypotyroidism.    PT Comments    Pt continues very cooperative and feeling slightly better after RBC transfusion this am.  Pt performed therex program with assist and assisted OOB to ambulate increased, but still fatigue limited, distance in hall.     Recommendations for follow up therapy are one component of a multi-disciplinary discharge planning process, led by the attending physician.  Recommendations may be updated based on patient status, additional functional criteria and insurance authorization.  Follow Up Recommendations  Home health PT     Assistance Recommended at Discharge Frequent or constant Supervision/Assistance  Equipment Recommendations  None recommended by PT    Recommendations for Other Services       Precautions / Restrictions Precautions Precautions: Fall Restrictions Weight Bearing Restrictions: No Other Position/Activity Restrictions: WBAT     Mobility  Bed Mobility Overal bed mobility: Needs Assistance Bed Mobility: Supine to Sit     Supine to sit: Mod assist     General bed mobility comments: cues for sequence and use of R LE to self assist.  Physical assist to manage L LE, to control trunk and to complete rotation to EOB sitting utilizing bed pad    Transfers Overall transfer level: Needs assistance Equipment used: Rolling walker (2 wheels) Transfers: Sit to/from Stand Sit to Stand: Min assist           General transfer comment: cues for LE management and use of UEs to self assist.  Physical assist to bring wt up and fwd and to balance in initial standing.    Ambulation/Gait Ambulation/Gait assistance: Min assist;+2 safety/equipment Gait Distance (Feet): 33  Feet Assistive device: Rolling walker (2 wheels) Gait Pattern/deviations: Step-to pattern;Decreased stride length;Trunk flexed;Shuffle Gait velocity: decr     General Gait Details: Increased time with cues for sequence, posture and position from RW.   Stairs             Wheelchair Mobility    Modified Rankin (Stroke Patients Only)       Balance Overall balance assessment: Needs assistance Sitting-balance support: Feet supported Sitting balance-Leahy Scale: Fair     Standing balance support: Reliant on assistive device for balance;During functional activity;Bilateral upper extremity supported Standing balance-Leahy Scale: Poor                              Cognition Arousal/Alertness: Awake/alert Behavior During Therapy: WFL for tasks assessed/performed Overall Cognitive Status: Within Functional Limits for tasks assessed                                          Exercises Total Joint Exercises Ankle Circles/Pumps: AROM;Both;20 reps;Seated Quad Sets: AROM;Both;10 reps;Supine Heel Slides: AAROM;Left;15 reps;Supine Hip ABduction/ADduction: AAROM;Left;15 reps;Supine    General Comments        Pertinent Vitals/Pain Pain Assessment: 0-10 Pain Score: 5  Pain Location: Lt hip/groin Pain Descriptors / Indicators: Aching;Sore Pain Intervention(s): Limited activity within patient's tolerance;Monitored during session (pt declines premed)    Home Living Family/patient expects to be discharged to:: Private residence Living Arrangements: Children  Prior Function            PT Goals (current goals can now be found in the care plan section) Acute Rehab PT Goals Patient Stated Goal: recover independence PT Goal Formulation: With patient/family Time For Goal Achievement: 12/10/20 Potential to Achieve Goals: Good Progress towards PT goals: Progressing toward goals    Frequency    7X/week      PT  Plan Current plan remains appropriate    Co-evaluation              AM-PAC PT "6 Clicks" Mobility   Outcome Measure  Help needed turning from your back to your side while in a flat bed without using bedrails?: A Lot Help needed moving from lying on your back to sitting on the side of a flat bed without using bedrails?: A Lot Help needed moving to and from a bed to a chair (including a wheelchair)?: A Lot Help needed standing up from a chair using your arms (e.g., wheelchair or bedside chair)?: A Little Help needed to walk in hospital room?: A Little Help needed climbing 3-5 steps with a railing? : Total 6 Click Score: 13    End of Session Equipment Utilized During Treatment: Gait belt Activity Tolerance: Patient limited by fatigue;Patient tolerated treatment well Patient left: in chair;with call bell/phone within reach;with family/visitor present Nurse Communication: Mobility status PT Visit Diagnosis: Muscle weakness (generalized) (M62.81);Difficulty in walking, not elsewhere classified (R26.2)     Time: 8811-0315 PT Time Calculation (min) (ACUTE ONLY): 35 min  Charges:  $Gait Training: 8-22 mins $Therapeutic Exercise: 8-22 mins                     Mauro Kaufmann PT Acute Rehabilitation Services Pager [phone] Office [phone]    Eileen Wright 12/05/2020, 4:28 PM

## 2020-12-06 DIAGNOSIS — E039 Hypothyroidism, unspecified: Secondary | ICD-10-CM

## 2020-12-06 DIAGNOSIS — N1832 Chronic kidney disease, stage 3b: Secondary | ICD-10-CM

## 2020-12-06 DIAGNOSIS — I159 Secondary hypertension, unspecified: Secondary | ICD-10-CM

## 2020-12-06 DIAGNOSIS — J449 Chronic obstructive pulmonary disease, unspecified: Secondary | ICD-10-CM

## 2020-12-06 DIAGNOSIS — M1612 Unilateral primary osteoarthritis, left hip: Principal | ICD-10-CM

## 2020-12-06 DIAGNOSIS — K219 Gastro-esophageal reflux disease without esophagitis: Secondary | ICD-10-CM

## 2020-12-06 DIAGNOSIS — E875 Hyperkalemia: Secondary | ICD-10-CM

## 2020-12-06 LAB — BASIC METABOLIC PANEL
Anion gap: 8 (ref 5–15)
BUN: 62 mg/dL — ABNORMAL HIGH (ref 8–23)
CO2: 19 mmol/L — ABNORMAL LOW (ref 22–32)
Calcium: 8.5 mg/dL — ABNORMAL LOW (ref 8.9–10.3)
Chloride: 106 mmol/L (ref 98–111)
Creatinine, Ser: 1.55 mg/dL — ABNORMAL HIGH (ref 0.44–1.00)
GFR, Estimated: 34 mL/min — ABNORMAL LOW (ref >60)
Glucose, Bld: 94 mg/dL (ref 70–99)
Potassium: 4.7 mmol/L (ref 3.5–5.1)
Sodium: 133 mmol/L — ABNORMAL LOW (ref 135–145)

## 2020-12-06 LAB — CBC
HCT: 25 % — ABNORMAL LOW (ref 36.0–46.0)
Hemoglobin: 8.2 g/dL — ABNORMAL LOW (ref 12.0–15.0)
MCH: 31.5 pg (ref 26.0–34.0)
MCHC: 32.8 g/dL (ref 30.0–36.0)
MCV: 96.2 fL (ref 80.0–100.0)
Platelets: 252 10*3/uL (ref 150–400)
RBC: 2.6 MIL/uL — ABNORMAL LOW (ref 3.87–5.11)
RDW: 13.6 % (ref 11.5–15.5)
WBC: 13.1 10*3/uL — ABNORMAL HIGH (ref 4.0–10.5)
nRBC: 0 % (ref 0.0–0.2)

## 2020-12-06 MED ORDER — SODIUM CHLORIDE 0.9 % IV SOLN: INTRAVENOUS | Status: DC

## 2020-12-06 NOTE — TOC Progression Note (Addendum)
Transition of Care [facility]) - Progression Note    Patient Details  Name: Eileen Wright MRN: [id number] Date of Birth: October 06, 1940  [facility]) CM/SW Contact  Darleene Cleaver, [place] Phone Number: 12/06/2020, 12:45 PM  Clinical Narrative:     CSW spoke to patient and her daughter who was at bedside.  Patient is now agreeable to home health PT and OT, she would also like a youth walker.  CSW asked if patient had a preference for an agency, and she said no.  CSW contacted [facility], and they can accept patient for home health PT and OT.  CSW sent message for PA to put orders in.  CSW to continue to follow patient's progress throughout discharge planning.  1:15pm  CSW was informed by PA that orders are in for home health and youth walker.  CSW contacted [facility], they will deliver a youth walker to patient's room.      Barriers to Discharge: No Barriers Identified  Expected Discharge Plan and Services  Home with home health.         Expected Discharge Date: 12/04/20               DME Arranged: N/A DME Agency: NA                   Social Determinants of Health (SDOH) Interventions    Readmission Risk Interventions No flowsheet data found.

## 2020-12-06 NOTE — Progress Notes (Signed)
Physical Therapy Treatment Patient Details Name: Eileen Wright MRN: [id number] DOB: 1940/06/21 Today's Date: 12/06/2020   History of Present Illness Patient is 80 y.o. female s/p Lt THA anterior approach on 12/03/20 with PMH significant for anemia, OA, COPD, GERD, HLD, HTN, hypotyroidism.    PT Comments    Pt continues very cooperative but progressing slowly 2* pain, fatigue and premorbid deconditioning.   Recommendations for follow up therapy are one component of a multi-disciplinary discharge planning process, led by the attending physician.  Recommendations may be updated based on patient status, additional functional criteria and insurance authorization.  Follow Up Recommendations  Home health PT     Assistance Recommended at Discharge Frequent or constant Supervision/Assistance  Equipment Recommendations  None recommended by PT    Recommendations for Other Services       Precautions / Restrictions Precautions Precautions: Fall Restrictions Weight Bearing Restrictions: No Other Position/Activity Restrictions: WBAT     Mobility  Bed Mobility Overal bed mobility: Needs Assistance Bed Mobility: Supine to Sit     Supine to sit: Min assist;Mod assist     General bed mobility comments: Increased time with cues for sequence and use of R LE to self assist.  Physical assist to manage L LE, to control trunk and to complete rotation to EOB sitting utilizing bed pad    Transfers Overall transfer level: Needs assistance Equipment used: Rolling walker (2 wheels) Transfers: Sit to/from Stand;Bed to chair/wheelchair/BSC Sit to Stand: Min assist     Step pivot transfers: Min assist;Mod assist     General transfer comment: cues for LE management and use of UEs to self assist.  Physical assist to bring wt up and fwd and to balance in initial standing.  Step/pvt EOB to [facility]    Ambulation/Gait Ambulation/Gait assistance: Min assist;+2 safety/equipment Gait Distance (Feet): 6  Feet Assistive device: Rolling walker (2 wheels) Gait Pattern/deviations: Step-to pattern;Decreased stride length;Trunk flexed;Shuffle Gait velocity: decr     General Gait Details: Increased time with cues for sequence, posture and position from RW.   Stairs             Wheelchair Mobility    Modified Rankin (Stroke Patients Only)       Balance Overall balance assessment: Needs assistance Sitting-balance support: Feet supported Sitting balance-Leahy Scale: Fair     Standing balance support: Reliant on assistive device for balance;During functional activity;Bilateral upper extremity supported Standing balance-Leahy Scale: Poor                              Cognition Arousal/Alertness: Awake/alert Behavior During Therapy: WFL for tasks assessed/performed Overall Cognitive Status: Within Functional Limits for tasks assessed                                          Exercises Total Joint Exercises Ankle Circles/Pumps: AROM;Both;20 reps;Seated Quad Sets: AROM;Both;10 reps;Supine Heel Slides: AAROM;Left;15 reps;Supine Hip ABduction/ADduction: AAROM;Left;15 reps;Supine    General Comments        Pertinent Vitals/Pain Pain Assessment: 0-10 Pain Score: 5  Pain Location: Lt hip/groin Pain Descriptors / Indicators: Aching;Sore Pain Intervention(s): Limited activity within patient's tolerance;Monitored during session;Premedicated before session    Home Living                          Prior Function  PT Goals (current goals can now be found in the care plan section) Acute Rehab PT Goals Patient Stated Goal: recover independence PT Goal Formulation: With patient/family Time For Goal Achievement: 12/10/20 Potential to Achieve Goals: Good Progress towards PT goals: Progressing toward goals    Frequency    7X/week      PT Plan Current plan remains appropriate    Co-evaluation              AM-PAC  PT "6 Clicks" Mobility   Outcome Measure  Help needed turning from your back to your side while in a flat bed without using bedrails?: A Lot Help needed moving from lying on your back to sitting on the side of a flat bed without using bedrails?: A Lot Help needed moving to and from a bed to a chair (including a wheelchair)?: A Lot Help needed standing up from a chair using your arms (e.g., wheelchair or bedside chair)?: A Little Help needed to walk in hospital room?: Total Help needed climbing 3-5 steps with a railing? : Total 6 Click Score: 11    End of Session Equipment Utilized During Treatment: Gait belt Activity Tolerance: Patient limited by fatigue;Patient tolerated treatment well Patient left: in chair;with call bell/phone within reach Nurse Communication: Mobility status PT Visit Diagnosis: Muscle weakness (generalized) (M62.81);Difficulty in walking, not elsewhere classified (R26.2)     Time: 1030-1104 PT Time Calculation (min) (ACUTE ONLY): 34 min  Charges:  $Gait Training: 8-22 mins $Therapeutic Exercise: 8-22 mins                     Mauro Kaufmann PT Acute Rehabilitation Services Pager [phone] Office [phone]    Clebert Wenger 12/06/2020, 2:28 PM

## 2020-12-06 NOTE — Progress Notes (Signed)
Eileen Wright  MRN: [id number] DOB/Age: 80/03/1940 80 y.o. [facility] Procedure: Procedure(s) (LRB): TOTAL HIP ARTHROPLASTY ANTERIOR APPROACH (Left)     Subjective: Seen working with PT, still with limited tolerance and unable to make it to the hallway today. Has not had BM yet  Vital Signs Temp:  [97.7 F (36.5 C)-98.5 F (36.9 C)] 97.7 F (36.5 C) (12/04 0427) Pulse Rate:  [96-100] 100 (12/04 0427) Resp:  [16] 16 (12/04 0427) BP: (84-108)/(42-53) 108/47 (12/04 0427) SpO2:  [95 %-100 %] 95 % (12/04 0427) Weight:  [71.9 kg] 71.9 kg (12/03 1540)  Lab Results Recent Labs    12/05/20 0421 12/05/20 1403 12/06/20 0333  WBC 12.8*  --  13.1*  HGB 6.6* 8.4* 8.2*  HCT 20.0* 25.7* 25.0*  PLT 236  --  252   BMET Recent Labs    12/05/20 0421 12/06/20 0333  NA 134* 133*  K 5.5* 4.7  CL 111 106  CO2 20* 19*  GLUCOSE 122* 94  BUN 60* 62*  CREATININE 1.69* 1.55*  CALCIUM 8.6* 8.5*   INR  Date Value Ref Range Status  11/23/2020 1.0 0.8 - 1.2 Final    Comment:    (NOTE) INR goal varies based on device and disease states. Performed at [facility], [address], [place], [place] [address]      Exam Up in chair currently and winded/tired from therapy attempts NVI Bilat LE        Plan Continue attempts to mobilize Needs to work on [facility] today Has daughter who plans to stay with her, I suspect she will be ready by tomorrow  Ralene Bathe PA-C  12/06/2020, 11:10 AM Contact # [phone]

## 2020-12-06 NOTE — Progress Notes (Signed)
Physical Therapy Treatment Patient Details Name: Eileen Wright MRN: [id number] DOB: September 25, 1940 Today's Date: 12/06/2020   History of Present Illness Patient is 80 y.o. female s/p Lt THA anterior approach on 12/03/20 with PMH significant for anemia, OA, COPD, GERD, HLD, HTN, hypotyroidism.    PT Comments    Pt continues very cooperative but fatigues easily and progressing slowly with mobility.  This pm, pt up to [facility] for toileting and ambulated limited distance in hall -transferred to chair 2* fatigue to return to room and step-pvt recliner to bed.   Recommendations for follow up therapy are one component of a multi-disciplinary discharge planning process, led by the attending physician.  Recommendations may be updated based on patient status, additional functional criteria and insurance authorization.  Follow Up Recommendations  Home health PT     Assistance Recommended at Discharge Frequent or constant Supervision/Assistance  Equipment Recommendations  None recommended by PT    Recommendations for Other Services       Precautions / Restrictions Precautions Precautions: Fall Restrictions Weight Bearing Restrictions: No Other Position/Activity Restrictions: WBAT     Mobility  Bed Mobility Overal bed mobility: Needs Assistance Bed Mobility: Sit to Supine     Supine to sit: Min assist;Mod assist Sit to supine: Mod assist   General bed mobility comments: Increased time with cues for sequence and use of R LE to self assist.  Physical assist to manage bil LEs onto bed.    Transfers Overall transfer level: Needs assistance Equipment used: Rolling walker (2 wheels) Transfers: Sit to/from Stand;Bed to chair/wheelchair/BSC Sit to Stand: Min assist     Step pivot transfers: Min assist;Mod assist     General transfer comment: cues for LE management and use of UEs to self assist.  Physical assist to bring wt up and fwd and to balance in initial standing.  Step/pvt recliner to  [facility]    Ambulation/Gait Ambulation/Gait assistance: Min assist;+2 safety/equipment Gait Distance (Feet): 22 Feet Assistive device: Rolling walker (2 wheels) Gait Pattern/deviations: Step-to pattern;Decreased stride length;Trunk flexed;Shuffle Gait velocity: decr     General Gait Details: Increased time with cues for sequence, posture and position from RW.  Distance ltd by fatigue   Stairs             Wheelchair Mobility    Modified Rankin (Stroke Patients Only)       Balance Overall balance assessment: Needs assistance Sitting-balance support: Feet supported Sitting balance-Leahy Scale: Fair     Standing balance support: Reliant on assistive device for balance;During functional activity;Bilateral upper extremity supported Standing balance-Leahy Scale: Poor                              Cognition Arousal/Alertness: Awake/alert Behavior During Therapy: WFL for tasks assessed/performed Overall Cognitive Status: Within Functional Limits for tasks assessed                                          Exercises Total Joint Exercises Ankle Circles/Pumps: AROM;Both;20 reps;Seated Quad Sets: AROM;Both;10 reps;Supine Heel Slides: AAROM;Left;15 reps;Supine Hip ABduction/ADduction: AAROM;Left;15 reps;Supine    General Comments        Pertinent Vitals/Pain Pain Assessment: 0-10 Pain Score: 5  Pain Location: Lt hip/groin Pain Descriptors / Indicators: Aching;Sore Pain Intervention(s): Limited activity within patient's tolerance;Monitored during session;Ice applied    Home Living  Prior Function            PT Goals (current goals can now be found in the care plan section) Acute Rehab PT Goals Patient Stated Goal: recover independence PT Goal Formulation: With patient/family Time For Goal Achievement: 12/10/20 Potential to Achieve Goals: Good Progress towards PT goals: Progressing toward goals     Frequency    7X/week      PT Plan Current plan remains appropriate    Co-evaluation              AM-PAC PT "6 Clicks" Mobility   Outcome Measure  Help needed turning from your back to your side while in a flat bed without using bedrails?: A Lot Help needed moving from lying on your back to sitting on the side of a flat bed without using bedrails?: A Lot Help needed moving to and from a bed to a chair (including a wheelchair)?: A Lot Help needed standing up from a chair using your arms (e.g., wheelchair or bedside chair)?: A Little Help needed to walk in hospital room?: A Little Help needed climbing 3-5 steps with a railing? : Total 6 Click Score: 13    End of Session Equipment Utilized During Treatment: Gait belt Activity Tolerance: Patient limited by fatigue;Patient tolerated treatment well Patient left: in bed Nurse Communication: Mobility status PT Visit Diagnosis: Muscle weakness (generalized) (M62.81);Difficulty in walking, not elsewhere classified (R26.2)     Time: 5400-8676 PT Time Calculation (min) (ACUTE ONLY): 24 min  Charges:  $Gait Training: 8-22 mins $Therapeutic Exercise: 8-22 mins $Therapeutic Activity: 8-22 mins                     Mauro Kaufmann PT Acute Rehabilitation Services Pager [phone] Office [phone]    Eileen Wright 12/06/2020, 2:34 PM

## 2020-12-06 NOTE — Plan of Care (Signed)
  Problem: Nutrition: Goal: Adequate nutrition will be maintained Outcome: Progressing   Problem: Pain Managment: Goal: General experience of comfort will improve Outcome: Progressing   

## 2020-12-06 NOTE — Progress Notes (Addendum)
PROGRESS NOTE  Eileen Wright HKV:[id number] DOB: 19-Jul-1940 DOA: 12/03/2020 PCP: Nicholaus Bloom, MD   LOS: 1 day   Brief narrative:  Eileen Wright is an 80 y.o. female with past medical history of chronic blood loss anemia, osteoarthritis, unspecified cardiomyopathy, GERD, heart murmur, hyperlipidemia, hypertension, hypothyroidism, mitral valve insufficiency, history of pneumonia, history of nonbleeding PUD presented to hospital for hip pain and was admitted for total left hip arthroplasty.  He had mild anemia after surgery so medical team was consulted for evaluation.  Assessment/Plan:  Principal Problem:   Acute blood loss anemia Active Problems:   Osteoarthritis of left hip   Primary osteoarthritis of left hip   Hyperkalemia   Hypothyroidism   COPD (chronic obstructive pulmonary disease) (HCC)   Hypertension   GERD (gastroesophageal reflux disease)   Stage 3b chronic kidney disease (CKD) (HCC)   Acute blood loss anemia on background of chronic anemia. Patient received 1 units of packed RBC with improvement in hemoglobin to 8.2 from 7.1.  MCV within normal range.  Stool occult blood pending due to lack of bowel movements..  Iron profile vitamin B12 and folic acid ordered.  Mild leukocytosis.  No evidence of infection.  Likely reactive.      Primary osteoarthritis of left hip Status post hip surgery.  Management as per primary team.     Hyperkalemia Improved.  Lisinopril on hold.     Hypothyroidism Continue Synthroid.     Chronic obstructive pulmonary disease Continue Advair, Spiriva, as needed oxygen, bronchodilators.  Appears compensated at this time.    Hypertension  Currently on metoprolol.  Patient is on HCTZ torsemide and lisinopril at home.  Continue IV fluids overnight.     GERD  Continue Protonix.     Stage 3b chronic kidney disease  Creatinine at 1.5.  Monitor BMP.  DVT prophylaxis: SCDs Start: 12/03/20 1421  Code Status: Full code  Family  Communication:  Spoke with the patient and family at bedside.  Status is: Inpatient  Procedures: Left total hip arthroplasty  Anti-infectives:  None  Anti-infectives (From admission, onward)    Start     Dose/Rate Route Frequency Ordered Stop   12/03/20 1500  ceFAZolin (ANCEF) IVPB 2g/100 mL premix        2 g 200 mL/hr over 30 Minutes Intravenous Every 6 hours 12/03/20 1420 12/03/20 2135   12/03/20 0600  ceFAZolin (ANCEF) IVPB 2g/100 mL premix        2 g 200 mL/hr over 30 Minutes Intravenous On call to O.R. 12/03/20 0516 12/03/20 0745      Subjective: Today, patient was seen and examined at bedside.  Denies any nausea vomiting fever or chills.  Objective: Vitals:   12/06/20 1140 12/06/20 1401  BP: (!) 90/49 (!) 133/59  Pulse: 97   Resp:  17  Temp:  97.6 F (36.4 C)  SpO2:      Intake/Output Summary (Last 24 hours) at 12/06/2020 1415 Last data filed at 12/06/2020 1010 Gross per 24 hour  Intake 720 ml  Output --  Net 720 ml   Filed Weights   12/03/20 0611 12/05/20 1540  Weight: 71.9 kg 71.9 kg   Body mass index is 29.93 kg/m.   Physical Exam: GENERAL: Patient is alert awake and communicative, deconditioned, not in obvious distress. HENT: Mild pallor noted.  Pupils equally reactive to light. Oral mucosa is moist NECK: is supple, no gross swelling noted. CHEST: Clear to auscultation. No crackles or wheezes.  Diminished breath sounds  bilaterally. CVS: S1 and S2 heard, no murmur. Regular rate and rhythm.  ABDOMEN: Soft, non-tender, bowel sounds are present. EXTREMITIES: No edema.  Incision site clean dry and intact. CNS: Cranial nerves are intact. No focal motor deficits. SKIN: warm and dry   Data Review: I have personally reviewed the following laboratory data and studies,  CBC: Recent Labs  Lab 12/04/20 0357 12/05/20 0421 12/05/20 1403 12/06/20 0333  WBC 8.9 12.8*  --  13.1*  HGB 7.1* 6.6* 8.4* 8.2*  HCT 22.1* 20.0* 25.7* 25.0*  MCV 96.5 95.2  --   96.2  PLT 233 236  --  [id number]   Basic Metabolic Panel: Recent Labs  Lab 12/04/20 0357 12/04/20 1253 12/05/20 0421 12/06/20 0333  NA 136 135 134* 133*  K 5.1 5.4* 5.5* 4.7  CL 112* 110 111 106  CO2 19* 18* 20* 19*  GLUCOSE 115* 126* 122* 94  BUN 63* 57* 60* 62*  CREATININE 1.82* 1.47* 1.69* 1.55*  CALCIUM 8.5* 9.0 8.6* 8.5*   Liver Function Tests: No results for input(s): AST, ALT, ALKPHOS, BILITOT, PROT, ALBUMIN in the last 168 hours. No results for input(s): LIPASE, AMYLASE in the last 168 hours. No results for input(s): AMMONIA in the last 168 hours. Cardiac Enzymes: No results for input(s): CKTOTAL, CKMB, CKMBINDEX, TROPONINI in the last 168 hours. BNP (last 3 results) No results for input(s): BNP in the last 8760 hours.  ProBNP (last 3 results) No results for input(s): PROBNP in the last 8760 hours.  CBG: No results for input(s): GLUCAP in the last 168 hours. Recent Results (from the past 240 hour(s))  SARS CORONAVIRUS 2 (TAT 6-24 HRS) Nasopharyngeal Nasopharyngeal Swab     Status: None   Collection Time: 12/01/20 10:41 AM   Specimen: Nasopharyngeal Swab  Result Value Ref Range Status   SARS Coronavirus 2 NEGATIVE NEGATIVE Final    Comment: (NOTE) SARS-CoV-2 target nucleic acids are NOT DETECTED.  The SARS-CoV-2 RNA is generally detectable in upper and lower respiratory specimens during the acute phase of infection. Negative results do not preclude SARS-CoV-2 infection, do not rule out co-infections with other pathogens, and should not be used as the sole basis for treatment or other patient management decisions. Negative results must be combined with clinical observations, patient history, and epidemiological information. The expected result is Negative.  Fact Sheet for Patients: SugarRoll.be  Fact Sheet for Healthcare Providers: https://www.woods-mathews.com/  This test is not yet approved or cleared by the [place] FDA and  has been authorized for detection and/or diagnosis of SARS-CoV-2 by FDA under an Emergency Use Authorization (EUA). This EUA will remain  in effect (meaning this test can be used) for the duration of the COVID-19 declaration under Se ction 564(b)(1) of the Act, 21 U.S.C. section 360bbb-3(b)(1), unless the authorization is terminated or revoked sooner.  Performed at [facility], [place] [address], [place], [place] [address]      Studies: No results found.    Flora Lipps, MD  [facility] 12/06/2020  If 7PM-7AM, please contact night-coverage

## 2020-12-07 ENCOUNTER — Encounter (HOSPITAL_COMMUNITY): Payer: Self-pay | Admitting: Orthopedic Surgery

## 2020-12-07 LAB — IRON AND TIBC
Iron: 13 ug/dL — ABNORMAL LOW (ref 28–170)
Saturation Ratios: 8 % — ABNORMAL LOW (ref 10.4–31.8)
TIBC: 159 ug/dL — ABNORMAL LOW (ref 250–450)
UIBC: 146 ug/dL

## 2020-12-07 LAB — FERRITIN: Ferritin: 223 ng/mL (ref 11–307)

## 2020-12-07 LAB — BASIC METABOLIC PANEL
Anion gap: 4 — ABNORMAL LOW (ref 5–15)
BUN: 49 mg/dL — ABNORMAL HIGH (ref 8–23)
CO2: 21 mmol/L — ABNORMAL LOW (ref 22–32)
Calcium: 8.6 mg/dL — ABNORMAL LOW (ref 8.9–10.3)
Chloride: 109 mmol/L (ref 98–111)
Creatinine, Ser: 1.34 mg/dL — ABNORMAL HIGH (ref 0.44–1.00)
GFR, Estimated: 40 mL/min — ABNORMAL LOW (ref >60)
Glucose, Bld: 100 mg/dL — ABNORMAL HIGH (ref 70–99)
Potassium: 5.1 mmol/L (ref 3.5–5.1)
Sodium: 134 mmol/L — ABNORMAL LOW (ref 135–145)

## 2020-12-07 LAB — TYPE AND SCREEN
ABO/RH(D): A POS
Antibody Screen: NEGATIVE
Unit division: 0

## 2020-12-07 LAB — VITAMIN B12: Vitamin B-12: 600 pg/mL (ref 180–914)

## 2020-12-07 LAB — BPAM RBC
Blood Product Expiration Date: 202212242359
ISSUE DATE / TIME: 202212030809
Unit Type and Rh: 6200

## 2020-12-07 LAB — FOLATE: Folate: 22.4 ng/mL (ref >5.9)

## 2020-12-07 MED ORDER — FERROUS SULFATE 325 (65 FE) MG PO TABS
325.0000 mg | ORAL_TABLET | Freq: Two times a day (BID) | ORAL | 0 refills | Status: DC
Start: 2020-12-07 — End: 2023-07-04

## 2020-12-07 MED ORDER — FERROUS SULFATE 325 (65 FE) MG PO TABS: 325.0000 mg | ORAL_TABLET | Freq: Two times a day (BID) | ORAL | Status: DC

## 2020-12-07 NOTE — Plan of Care (Signed)
Instructions were reviewed with patient. All questions were answered. Patient was transported to main entrance by wheelchair. ° °

## 2020-12-07 NOTE — Progress Notes (Signed)
    Subjective:  Patient reports pain as mild.  Denies N/V/CP/SOB.   Objective:   VITALS:   Vitals:   12/06/20 1720 12/06/20 1955 12/06/20 2048 12/07/20 0649  BP:   (!) 135/56 112/61  Pulse: 96  (!) 107 99  Resp:   18 16  Temp:   98.4 F (36.9 C) 98.1 F (36.7 C)  TempSrc:   Oral Oral  SpO2: 100% 99% 100% 94%  Weight:      Height:        NAD ABD soft Neurovascular intact Sensation intact distally Intact pulses distally Dorsiflexion/Plantar flexion intact Incision: dressing C/D/I   Lab Results  Component Value Date   WBC 13.1 (H) 12/06/2020   HGB 8.2 (L) 12/06/2020   HCT 25.0 (L) 12/06/2020   MCV 96.2 12/06/2020   PLT 252 12/06/2020   BMET    Component Value Date/Time   NA 134 (L) 12/07/2020 0342   K 5.1 12/07/2020 0342   CL 109 12/07/2020 0342   CO2 21 (L) 12/07/2020 0342   GLUCOSE 100 (H) 12/07/2020 0342   BUN 49 (H) 12/07/2020 0342   CREATININE 1.34 (H) 12/07/2020 0342   CALCIUM 8.6 (L) 12/07/2020 0342   GFRNONAA 40 (L) 12/07/2020 0342     Assessment/Plan: 4 Days Post-Op   Principal Problem:   Acute blood loss anemia Active Problems:   Osteoarthritis of left hip   Primary osteoarthritis of left hip   Hyperkalemia   Hypothyroidism   COPD (chronic obstructive pulmonary disease) (HCC)   Hypertension   GERD (gastroesophageal reflux disease)   Stage 3b chronic kidney disease (CKD) (HCC)   WBAT with walker DVT ppx: Aspirin, SCDs, TEDS PO pain control PT/OT ABLA: HgB 8.1 on most recent draw. Prescribed iron supplement as recommended by hospitalist. Appreciate their input. Patient will follow up outpatient with PCP for management of iron deficiency Dispo: D/C home after last therapy session this afternoon     Darrick Grinder 12/07/2020, 12:15 PM   [facility] is now [employer] [address], [place], [place] [address] Phone: [phone] [email or web address] Facebook  [employer]

## 2020-12-07 NOTE — Progress Notes (Signed)
Physical Therapy Treatment Patient Details Name: DANAY MCKELLAR MRN: [id number] DOB: 1940/07/20 Today's Date: 12/07/2020   History of Present Illness Patient is 80 y.o. female s/p Lt THA anterior approach on 12/03/20 with PMH significant for anemia, OA, COPD, GERD, HLD, HTN, hypotyroidism.    PT Comments    POD # 4 am session Assisted OOB required increased time.  General bed mobility comments: Increased time with cues for sequence.. L LE tebds to stay externally rotated.  attempted to teach pt how to use strap to self assist but she is use to using her hands.  "been doing it this way" for years. General transfer comment: 25% VC's on proper hand placement as well as increased time. General Gait Details: Increased time with cues for sequence, posture and position from RW.  Distance ltd by fatigue.  Audible cretitus B knees. Returned to room and performed a few TE's followed by ICE. Pt will need another PT session later today with daughter present to practice stairs.  Pt and I agreed on 3 pm for her second session with daughter.   Recommendations for follow up therapy are one component of a multi-disciplinary discharge planning process, led by the attending physician.  Recommendations may be updated based on patient status, additional functional criteria and insurance authorization.  Follow Up Recommendations  Home health PT     Assistance Recommended at Discharge Frequent or constant Supervision/Assistance  Equipment Recommendations  Rolling walker (2 wheels) (YOUTH)    Recommendations for Other Services       Precautions / Restrictions Precautions Precautions: Fall Precaution Comments: "bad" both knee R>L Restrictions Weight Bearing Restrictions: No Other Position/Activity Restrictions: WBAT     Mobility  Bed Mobility Overal bed mobility: Needs Assistance       Supine to sit: Min assist;Mod assist     General bed mobility comments: Increased time with cues for sequence.. L  LE tebds to stay externally rotated.  attempted to teach pt how to use strap to self assist but she is use to using her hands.  "been doing it this way" for years.    Transfers Overall transfer level: Needs assistance Equipment used: Rolling walker (2 wheels) Transfers: Sit to/from Stand Sit to Stand: Supervision;Min guard Stand pivot transfers: Min guard;Min assist         General transfer comment: 25% VC's on proper hand placement as well as increased time.    Ambulation/Gait Ambulation/Gait assistance: Supervision;Min guard Gait Distance (Feet): 24 Feet Assistive device: Rolling walker (2 wheels) Gait Pattern/deviations: Step-to pattern;Decreased stride length;Trunk flexed;Shuffle Gait velocity: decreased     General Gait Details: Increased time with cues for sequence, posture and position from RW.  Distance ltd by fatigue.  Audible cretitus B knees.   Stairs             Wheelchair Mobility    Modified Rankin (Stroke Patients Only)       Balance                                            Cognition Arousal/Alertness: Awake/alert Behavior During Therapy: WFL for tasks assessed/performed Overall Cognitive Status: Within Functional Limits for tasks assessed                                 General Comments: AxO x 3  very sweet lady [employer], [employer]  10 reps AP, knee presses and 5 reps ABD and HS     General Comments        Pertinent Vitals/Pain Pain Assessment: 0-10 Pain Score: 5  Pain Location: Lt hip/groin Pain Descriptors / Indicators: Aching;Sore Pain Intervention(s): Monitored during session;Premedicated before session;Repositioned;Ice applied    Home Living                          Prior Function            PT Goals (current goals can now be found in the care plan section) Progress towards PT goals: Progressing toward goals    Frequency    7X/week      PT Plan Current  plan remains appropriate    Co-evaluation              AM-PAC PT "6 Clicks" Mobility   Outcome Measure  Help needed turning from your back to your side while in a flat bed without using bedrails?: A Lot Help needed moving from lying on your back to sitting on the side of a flat bed without using bedrails?: A Lot Help needed moving to and from a bed to a chair (including a wheelchair)?: A Lot Help needed standing up from a chair using your arms (e.g., wheelchair or bedside chair)?: A Lot Help needed to walk in hospital room?: A Lot Help needed climbing 3-5 steps with a railing? : Total 6 Click Score: 11    End of Session Equipment Utilized During Treatment: Gait belt Activity Tolerance: Patient limited by fatigue;Patient tolerated treatment well Patient left: in chair;with call bell/phone within reach;with chair alarm set Nurse Communication: Mobility status PT Visit Diagnosis: Muscle weakness (generalized) (M62.81);Difficulty in walking, not elsewhere classified (R26.2)     Time: 1027-1100 PT Time Calculation (min) (ACUTE ONLY): 33 min  Charges:  $Gait Training: 8-22 mins $Therapeutic Activity: 8-22 mins                     Felecia Shelling  PTA Acute  Rehabilitation Services Pager      [phone] Office      [phone]

## 2020-12-07 NOTE — Progress Notes (Signed)
Physical Therapy Treatment Patient Details Name: JAYNE PECKENPAUGH MRN: [id number] DOB: 08-29-1940 Today's Date: 12/07/2020   History of Present Illness Patient is 80 y.o. female s/p Lt THA anterior approach on 12/03/20 with PMH significant for anemia, OA, COPD, GERD, HLD, HTN, hypotyroidism.    PT Comments    POD # 4 pm session with Daughter. General stair comments: with daughter presnt, "hands on" assisted and attempted to navigate the 2 steps pt has tio enter home.  However, pt was unable to safely complete.  Pt's "opposite" knee has Arthritis and is not strong enough to get her up even one step. Pt was unable to "pick up" L LE due to pain/new surgery.  We discussed going up backward but that increased pt's fear/anxiety.  Had to abort the attempt.  Daughter stated they have a whellchair and can "bump" Mom up into the house in her wheelchair.  Advised to have atleat + 2 help.  Daughter agreed. Pt is ready for D/C today with family support.   Recommendations for follow up therapy are one component of a multi-disciplinary discharge planning process, led by the attending physician.  Recommendations may be updated based on patient status, additional functional criteria and insurance authorization.  Follow Up Recommendations  Home health PT     Assistance Recommended at Discharge Frequent or constant Supervision/Assistance  Equipment Recommendations  Rolling walker (2 wheels) (YOUTH)    Recommendations for Other Services       Precautions / Restrictions Precautions Precautions: Fall Precaution Comments: "bad" both knee R>L Restrictions Weight Bearing Restrictions: No Other Position/Activity Restrictions: WBAT     Mobility  Bed Mobility Overal bed mobility: Needs Assistance       Supine to sit: Min assist;Mod assist     General bed mobility comments: had daughter "hands on" assisted pt to EOB.    Transfers Overall transfer level: Needs assistance Equipment used: Rolling  walker (2 wheels) Transfers: Sit to/from Stand Sit to Stand: Supervision;Min guard Stand pivot transfers: Min guard;Min assist         General transfer comment: 25% VC's on proper hand placement as well as increased time.    Ambulation/Gait Ambulation/Gait assistance: Supervision;Min guard Gait Distance (Feet): 4 Feet Assistive device: Rolling walker (2 wheels) Gait Pattern/deviations: Step-to pattern;Decreased stride length;Trunk flexed;Shuffle Gait velocity: decreased     General Gait Details: limited amb distance as session focused on stairs.   Stairs Stairs: Yes Stairs assistance: Total assist Stair Management: Two rails Number of Stairs: 2 General stair comments: with daughter presnt, "hands on" assisted and attempted to navigate the 2 steps pt has tio enter home.  However, pt was unable to safely complete.  Pt's "opposite" knee has Arthritis and is not strong enough to get her up even one step. Pt was unable to "pick up" L LE due to pain/new surgery.  We discussed going up backward but that increased pt's fear/anxiety.  Had to abort the attempt.  Daughter stated they have a whellchair and can "bump" Mom up into the house in her wheelchair.  Advised to have atleat + 2 help.  Daughter agreed.   Wheelchair Mobility    Modified Rankin (Stroke Patients Only)       Balance                                            Cognition Arousal/Alertness: Awake/alert Behavior During  Therapy: WFL for tasks assessed/performed Overall Cognitive Status: Within Functional Limits for tasks assessed                                 General Comments: AxO x 3 very sweet lady Retired Mudlogger Comments        Pertinent Vitals/Pain Pain Assessment: 0-10 Pain Score: 5  Pain Location: Lt hip/groin Pain Descriptors / Indicators: Aching;Sore Pain Intervention(s): Monitored during session;Premedicated before  session;Repositioned;Ice applied    Home Living                          Prior Function            PT Goals (current goals can now be found in the care plan section) Progress towards PT goals: Progressing toward goals    Frequency    7X/week      PT Plan Current plan remains appropriate    Co-evaluation              AM-PAC PT "6 Clicks" Mobility   Outcome Measure  Help needed turning from your back to your side while in a flat bed without using bedrails?: A Lot Help needed moving from lying on your back to sitting on the side of a flat bed without using bedrails?: A Lot Help needed moving to and from a bed to a chair (including a wheelchair)?: A Lot Help needed standing up from a chair using your arms (e.g., wheelchair or bedside chair)?: A Lot Help needed to walk in hospital room?: A Lot Help needed climbing 3-5 steps with a railing? : Total 6 Click Score: 11    End of Session Equipment Utilized During Treatment: Gait belt Activity Tolerance: Patient tolerated treatment well;No increased pain Patient left: in chair;with call bell/phone within reach;with chair alarm set Nurse Communication: Mobility status (pt ready for D/C to home) PT Visit Diagnosis: Muscle weakness (generalized) (M62.81);Difficulty in walking, not elsewhere classified (R26.2)     Time: 1525-1550 PT Time Calculation (min) (ACUTE ONLY): 25 min  Charges:  $Gait Training: 8-22 mins $Therapeutic Activity: 8-22 mins                     {Shirleen Mcfaul  PTA Acute  Rehabilitation Services Pager      [phone] Office      [phone]

## 2020-12-07 NOTE — Progress Notes (Signed)
PROGRESS NOTE  BRADI ARBUTHNOT CZY:[id number] DOB: 02-Mar-1940 DOA: 12/03/2020 PCP: Nicholaus Bloom, MD   LOS: 2 days   Brief narrative:  Eileen Wright is an 80 y.o. female with past medical history of chronic blood loss anemia, osteoarthritis, unspecified cardiomyopathy, GERD, heart murmur, hyperlipidemia, hypertension, hypothyroidism, mitral valve insufficiency, history of pneumonia, history of nonbleeding PUD presented to hospital for hip pain and was admitted for total left hip arthroplasty.  He had mild anemia after surgery so medical team was consulted for evaluation.  Assessment/Plan:  Principal Problem:   Acute blood loss anemia Active Problems:   Osteoarthritis of left hip   Primary osteoarthritis of left hip   Hyperkalemia   Hypothyroidism   COPD (chronic obstructive pulmonary disease) (HCC)   Hypertension   GERD (gastroesophageal reflux disease)   Stage 3b chronic kidney disease (CKD) (HCC)   Acute blood loss anemia on background of chronic anemia. Patient received 1 units of packed RBC with improvement in hemoglobin to 8.2 from 7.1.  MCV within normal range.  Stool occult blood pending due to lack of bowel movements..  Profile noted with low iron and low TIBC and low saturation.  Ferritin was within normal range including vitamin B12 and folic acid.  Could benefit from iron supplementation on discharge.  Mild leukocytosis.  No evidence of infection.  Likely reactive.      Primary osteoarthritis of left hip Status post hip surgery.  Management as per primary team.     Hyperkalemia Potassium of 5.1 today.  Lisinopril on hold.Marland Kitchen     Hypothyroidism Continue Synthroid.     Chronic obstructive pulmonary disease Continue Advair, Spiriva, as needed oxygen, bronchodilators.  Appears compensated at this time.    Hypertension  Currently on metoprolol.  Patient is on HCTZ torsemide and lisinopril at home.  Currently on hold.  Patient received IV fluid hydration overnight.   Blood pressure has improved.  Discontinue IV fluids since patient is taking orally.     GERD  Continue Protonix.     Stage 3b chronic kidney disease  Creatinine at 1.5.  Creatinine today at 1.3.  DVT prophylaxis: SCDs Start: 12/03/20 1421  Code Status: Full code  Family Communication:  Spoke with the patient at bedside.  Status is: Inpatient  Procedures: Left total hip arthroplasty  Anti-infectives:  None  Anti-infectives (From admission, onward)    Start     Dose/Rate Route Frequency Ordered Stop   12/03/20 1500  ceFAZolin (ANCEF) IVPB 2g/100 mL premix        2 g 200 mL/hr over 30 Minutes Intravenous Every 6 hours 12/03/20 1420 12/03/20 2135   12/03/20 0600  ceFAZolin (ANCEF) IVPB 2g/100 mL premix        2 g 200 mL/hr over 30 Minutes Intravenous On call to O.R. 12/03/20 0516 12/03/20 0745      Subjective: Today, patient was seen and examined at bedside.  Patient continues to feel okay.  Denies any nausea vomiting fever or chills.  Objective: Vitals:   12/06/20 2048 12/07/20 0649  BP: (!) 135/56 112/61  Pulse: (!) 107 99  Resp: 18 16  Temp: 98.4 F (36.9 C) 98.1 F (36.7 C)  SpO2:  94%    Intake/Output Summary (Last 24 hours) at 12/07/2020 1013 Last data filed at 12/07/2020 0915 Gross per 24 hour  Intake 1678.49 ml  Output 350 ml  Net 1328.49 ml    Filed Weights   12/03/20 0611 12/05/20 1540  Weight: 71.9 kg 71.9 kg  Body mass index is 29.93 kg/m.   Physical Exam: GENERAL: Patient is alert awake and communicative, deconditioned, not in obvious distress. HENT: Mild pallor noted.  Pupils equally reactive to light. Oral mucosa is moist NECK: is supple, no gross swelling noted. CHEST: Clear to auscultation. No crackles or wheezes.  Diminished breath sounds bilaterally. CVS: S1 and S2 heard, no murmur. Regular rate and rhythm.  ABDOMEN: Soft, non-tender, bowel sounds are present. EXTREMITIES: No edema.  Incision site clean dry and intact. CNS:  Cranial nerves are intact. No focal motor deficits. SKIN: warm and dry   Data Review: I have personally reviewed the following laboratory data and studies,  CBC: Recent Labs  Lab 12/04/20 0357 12/05/20 0421 12/05/20 1403 12/06/20 0333  WBC 8.9 12.8*  --  13.1*  HGB 7.1* 6.6* 8.4* 8.2*  HCT 22.1* 20.0* 25.7* 25.0*  MCV 96.5 95.2  --  96.2  PLT 233 236  --  [id number]    Basic Metabolic Panel: Recent Labs  Lab 12/04/20 0357 12/04/20 1253 12/05/20 0421 12/06/20 0333 12/07/20 0342  NA 136 135 134* 133* 134*  K 5.1 5.4* 5.5* 4.7 5.1  CL 112* 110 111 106 109  CO2 19* 18* 20* 19* 21*  GLUCOSE 115* 126* 122* 94 100*  BUN 63* 57* 60* 62* 49*  CREATININE 1.82* 1.47* 1.69* 1.55* 1.34*  CALCIUM 8.5* 9.0 8.6* 8.5* 8.6*    Liver Function Tests: No results for input(s): AST, ALT, ALKPHOS, BILITOT, PROT, ALBUMIN in the last 168 hours. No results for input(s): LIPASE, AMYLASE in the last 168 hours. No results for input(s): AMMONIA in the last 168 hours. Cardiac Enzymes: No results for input(s): CKTOTAL, CKMB, CKMBINDEX, TROPONINI in the last 168 hours. BNP (last 3 results) No results for input(s): BNP in the last 8760 hours.  ProBNP (last 3 results) No results for input(s): PROBNP in the last 8760 hours.  CBG: No results for input(s): GLUCAP in the last 168 hours. Recent Results (from the past 240 hour(s))  SARS CORONAVIRUS 2 (TAT 6-24 HRS) Nasopharyngeal Nasopharyngeal Swab     Status: None   Collection Time: 12/01/20 10:41 AM   Specimen: Nasopharyngeal Swab  Result Value Ref Range Status   SARS Coronavirus 2 NEGATIVE NEGATIVE Final    Comment: (NOTE) SARS-CoV-2 target nucleic acids are NOT DETECTED.  The SARS-CoV-2 RNA is generally detectable in upper and lower respiratory specimens during the acute phase of infection. Negative results do not preclude SARS-CoV-2 infection, do not rule out co-infections with other pathogens, and should not be used as the sole basis for  treatment or other patient management decisions. Negative results must be combined with clinical observations, patient history, and epidemiological information. The expected result is Negative.  Fact Sheet for Patients: SugarRoll.be  Fact Sheet for Healthcare Providers: https://www.woods-mathews.com/  This test is not yet approved or cleared by the [place] FDA and  has been authorized for detection and/or diagnosis of SARS-CoV-2 by FDA under an Emergency Use Authorization (EUA). This EUA will remain  in effect (meaning this test can be used) for the duration of the COVID-19 declaration under Se ction 564(b)(1) of the Act, 21 U.S.C. section 360bbb-3(b)(1), unless the authorization is terminated or revoked sooner.  Performed at [facility], [place] [address] [place], [place] [address]       Studies: No results found.    Flora Lipps, MD  [facility] 12/07/2020  If 7PM-7AM, please contact night-coverage

## 2022-04-30 IMAGING — RF DG HIP (WITH OR WITHOUT PELVIS) 1V*L*
1 series · 4 of 4 positions shown · non-contrast
Comparison: Subsequently performed single view pelvic radiograph
12/03/2020.

CLINICAL DATA: Provided history: Surgery, elective. Additional
history provided: Operative left anterior hip replacement.

EXAM:
DG HIP (WITH OR WITHOUT PELVIS) 1V*L*

[Series 1: unknown protocol · 0.20mm/px · 4 of 4 slices shown]
[im 1/4]
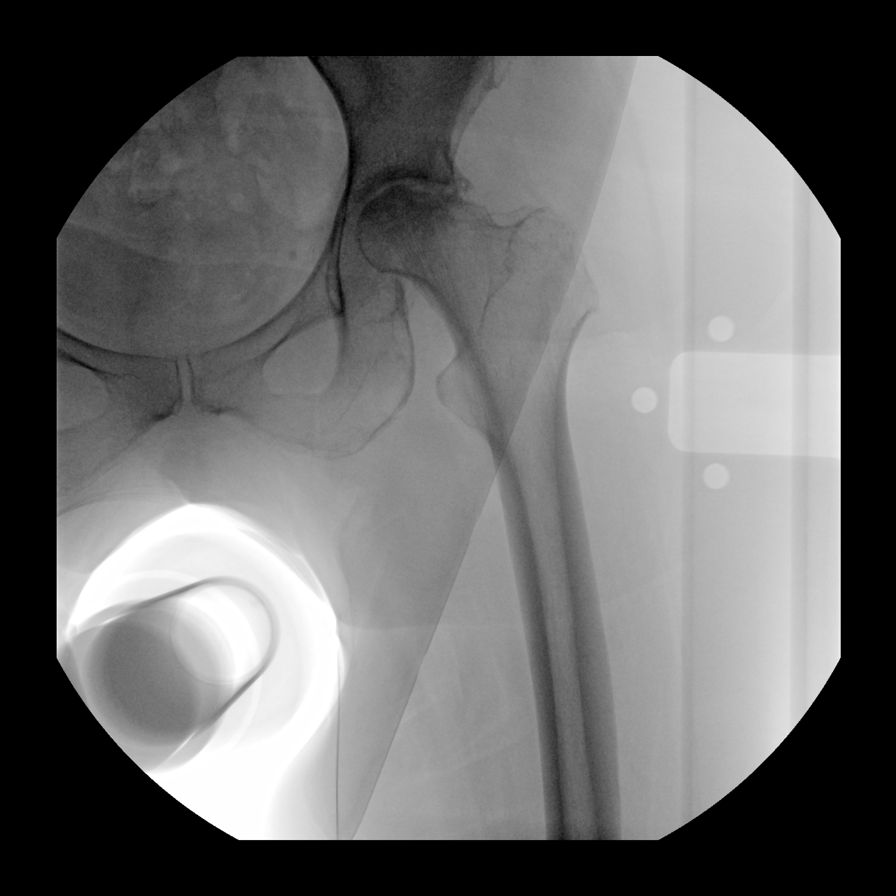
[im 2/4]
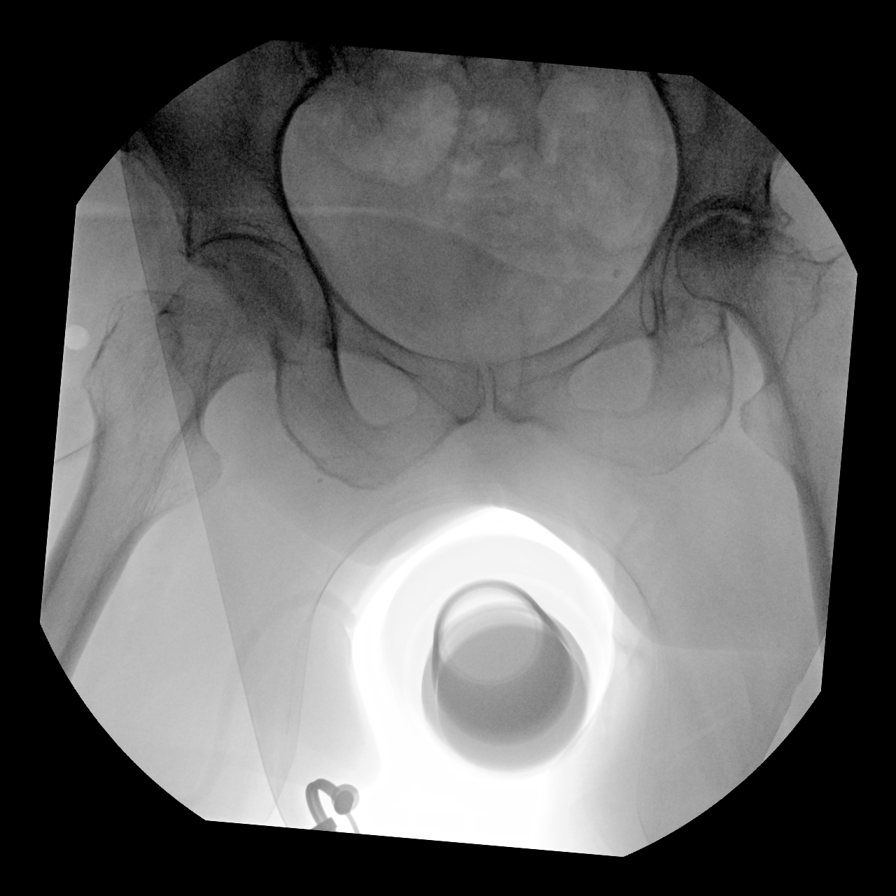
[im 3/4]
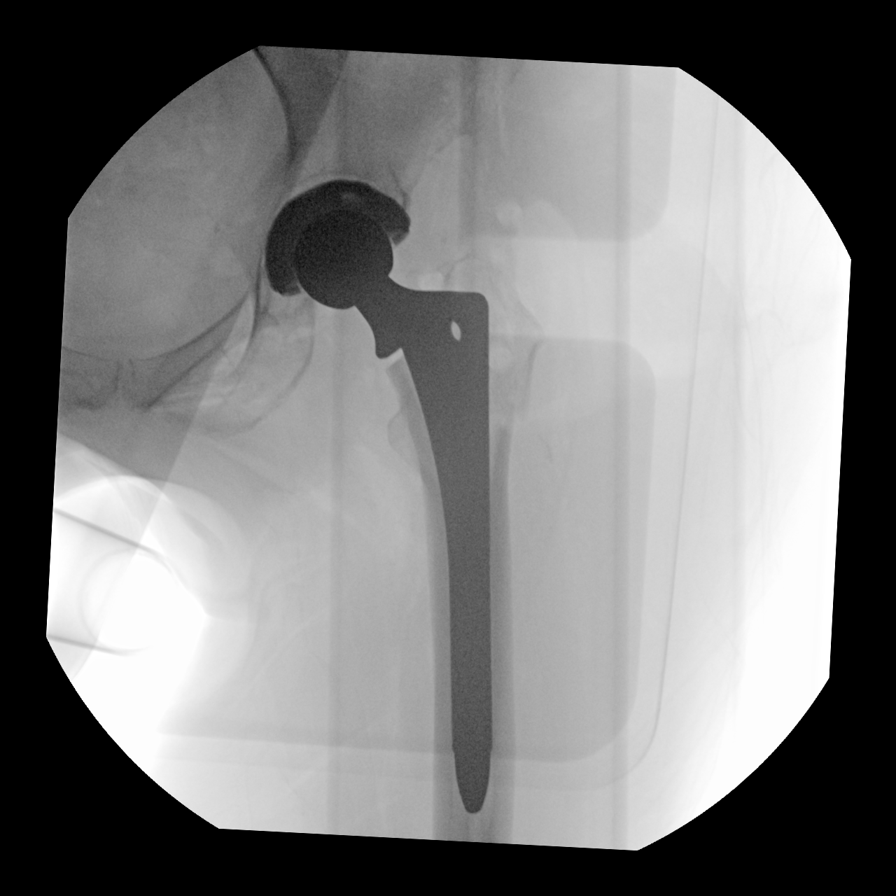
[im 4/4]
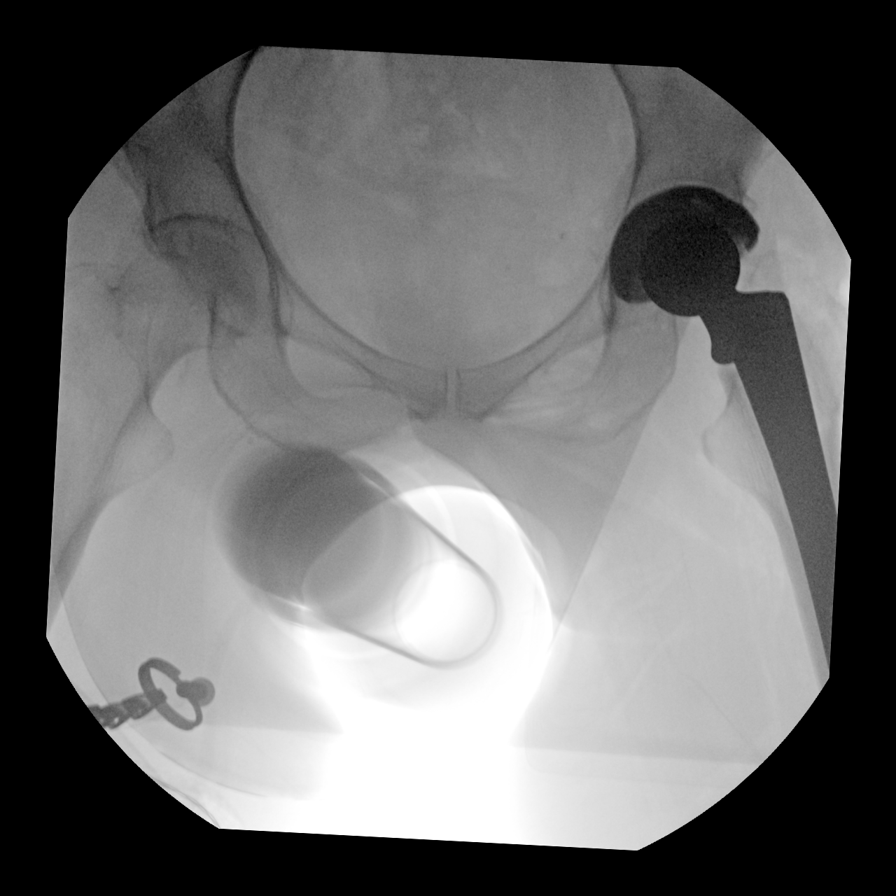

[4 of 4 positions shown; findings below may reference images not displayed]

FINDINGS: Four intraoperative fluoroscopic images of the left hip are
submitted. On the provided images, there are findings of interval
left total hip arthroplasty. The femoral and acetabular components
appear well-seated. No unexpected finding on the provided views.
IMPRESSION: 4 intraoperative fluoroscopic images from left total hip
arthroplasty, as described.

## 2023-01-31 ENCOUNTER — Other Ambulatory Visit: Payer: Self-pay

## 2023-01-31 ENCOUNTER — Emergency Department: Payer: Medicare PPO

## 2023-01-31 DIAGNOSIS — Z5321 Procedure and treatment not carried out due to patient leaving prior to being seen by health care provider: Secondary | ICD-10-CM | POA: Insufficient documentation

## 2023-01-31 DIAGNOSIS — M25551 Pain in right hip: Secondary | ICD-10-CM | POA: Diagnosis present

## 2023-01-31 NOTE — ED Triage Notes (Signed)
Pt to ED via EMS from home, pt reports r hip pain x 1 week, pt denies fall or injury to area. Pt states she has hx arthritis

## 2023-02-01 ENCOUNTER — Emergency Department: Payer: Medicare PPO

## 2023-02-01 ENCOUNTER — Emergency Department
Admission: EM | Admit: 2023-02-01 | Discharge: 2023-02-01 | Discharge status: Against Medical Advice | Payer: Medicare PPO | Attending: Emergency Medicine | Admitting: Emergency Medicine

## 2023-02-01 MED ORDER — HYDROCODONE-ACETAMINOPHEN 5-325 MG PO TABS
1.0000 | ORAL_TABLET | Freq: Once | ORAL | Status: AC
  Administered 2023-02-01: 1 via ORAL
  Filled 2023-02-01: qty 1

## 2023-02-01 NOTE — ED Notes (Signed)
Patient's daughter to desk stating they no longer wanted to wait to be seen. Patient encouraged to stay. NAD noted. Wheelchair to car by daughter.

## 2023-06-21 ENCOUNTER — Inpatient Hospital Stay
Admission: EM | Admit: 2023-06-21 | Discharge: 2023-07-04 | DRG: 871 | Disposition: A | Discharge status: Home Health | Attending: Internal Medicine | Admitting: Internal Medicine

## 2023-06-21 ENCOUNTER — Encounter: Payer: Self-pay | Admitting: Emergency Medicine

## 2023-06-21 ENCOUNTER — Other Ambulatory Visit: Payer: Self-pay

## 2023-06-21 ENCOUNTER — Emergency Department

## 2023-06-21 DIAGNOSIS — R54 Age-related physical debility: Secondary | ICD-10-CM | POA: Diagnosis present

## 2023-06-21 DIAGNOSIS — Z8249 Family history of ischemic heart disease and other diseases of the circulatory system: Secondary | ICD-10-CM

## 2023-06-21 DIAGNOSIS — E039 Hypothyroidism, unspecified: Secondary | ICD-10-CM | POA: Diagnosis not present

## 2023-06-21 DIAGNOSIS — Z87891 Personal history of nicotine dependence: Secondary | ICD-10-CM

## 2023-06-21 DIAGNOSIS — Z1152 Encounter for screening for COVID-19: Secondary | ICD-10-CM

## 2023-06-21 DIAGNOSIS — J449 Chronic obstructive pulmonary disease, unspecified: Secondary | ICD-10-CM | POA: Diagnosis not present

## 2023-06-21 DIAGNOSIS — Z79899 Other long term (current) drug therapy: Secondary | ICD-10-CM

## 2023-06-21 DIAGNOSIS — Z79890 Hormone replacement therapy (postmenopausal): Secondary | ICD-10-CM

## 2023-06-21 DIAGNOSIS — E663 Overweight: Secondary | ICD-10-CM | POA: Diagnosis present

## 2023-06-21 DIAGNOSIS — N10 Acute tubulo-interstitial nephritis: Secondary | ICD-10-CM | POA: Diagnosis present

## 2023-06-21 DIAGNOSIS — I429 Cardiomyopathy, unspecified: Secondary | ICD-10-CM | POA: Diagnosis present

## 2023-06-21 DIAGNOSIS — I82612 Acute embolism and thrombosis of superficial veins of left upper extremity: Secondary | ICD-10-CM | POA: Diagnosis not present

## 2023-06-21 DIAGNOSIS — N1832 Chronic kidney disease, stage 3b: Secondary | ICD-10-CM | POA: Diagnosis present

## 2023-06-21 DIAGNOSIS — E872 Acidosis, unspecified: Secondary | ICD-10-CM | POA: Diagnosis present

## 2023-06-21 DIAGNOSIS — A4152 Sepsis due to Pseudomonas: Secondary | ICD-10-CM | POA: Diagnosis not present

## 2023-06-21 DIAGNOSIS — T17990A Other foreign object in respiratory tract, part unspecified in causing asphyxiation, initial encounter: Secondary | ICD-10-CM | POA: Diagnosis not present

## 2023-06-21 DIAGNOSIS — Z6826 Body mass index (BMI) 26.0-26.9, adult: Secondary | ICD-10-CM

## 2023-06-21 DIAGNOSIS — Z8052 Family history of malignant neoplasm of bladder: Secondary | ICD-10-CM

## 2023-06-21 DIAGNOSIS — E875 Hyperkalemia: Secondary | ICD-10-CM | POA: Diagnosis present

## 2023-06-21 DIAGNOSIS — X58XXXA Exposure to other specified factors, initial encounter: Secondary | ICD-10-CM | POA: Diagnosis not present

## 2023-06-21 DIAGNOSIS — A419 Sepsis, unspecified organism: Secondary | ICD-10-CM | POA: Diagnosis present

## 2023-06-21 DIAGNOSIS — I050 Rheumatic mitral stenosis: Secondary | ICD-10-CM | POA: Diagnosis present

## 2023-06-21 DIAGNOSIS — E785 Hyperlipidemia, unspecified: Secondary | ICD-10-CM | POA: Diagnosis present

## 2023-06-21 DIAGNOSIS — K219 Gastro-esophageal reflux disease without esophagitis: Secondary | ICD-10-CM | POA: Diagnosis present

## 2023-06-21 DIAGNOSIS — I159 Secondary hypertension, unspecified: Secondary | ICD-10-CM

## 2023-06-21 DIAGNOSIS — Z881 Allergy status to other antibiotic agents status: Secondary | ICD-10-CM

## 2023-06-21 DIAGNOSIS — J189 Pneumonia, unspecified organism: Secondary | ICD-10-CM | POA: Diagnosis not present

## 2023-06-21 DIAGNOSIS — Z808 Family history of malignant neoplasm of other organs or systems: Secondary | ICD-10-CM

## 2023-06-21 DIAGNOSIS — I5032 Chronic diastolic (congestive) heart failure: Secondary | ICD-10-CM | POA: Diagnosis not present

## 2023-06-21 DIAGNOSIS — E876 Hypokalemia: Secondary | ICD-10-CM | POA: Diagnosis present

## 2023-06-21 DIAGNOSIS — D75838 Other thrombocytosis: Secondary | ICD-10-CM | POA: Diagnosis present

## 2023-06-21 DIAGNOSIS — J440 Chronic obstructive pulmonary disease with acute lower respiratory infection: Secondary | ICD-10-CM | POA: Diagnosis present

## 2023-06-21 DIAGNOSIS — I130 Hypertensive heart and chronic kidney disease with heart failure and stage 1 through stage 4 chronic kidney disease, or unspecified chronic kidney disease: Secondary | ICD-10-CM | POA: Diagnosis present

## 2023-06-21 DIAGNOSIS — R1310 Dysphagia, unspecified: Secondary | ICD-10-CM | POA: Diagnosis present

## 2023-06-21 DIAGNOSIS — D631 Anemia in chronic kidney disease: Secondary | ICD-10-CM | POA: Diagnosis present

## 2023-06-21 DIAGNOSIS — Z880 Allergy status to penicillin: Secondary | ICD-10-CM

## 2023-06-21 DIAGNOSIS — E1122 Type 2 diabetes mellitus with diabetic chronic kidney disease: Secondary | ICD-10-CM | POA: Diagnosis present

## 2023-06-21 DIAGNOSIS — N179 Acute kidney failure, unspecified: Principal | ICD-10-CM | POA: Diagnosis present

## 2023-06-21 DIAGNOSIS — Z7951 Long term (current) use of inhaled steroids: Secondary | ICD-10-CM

## 2023-06-21 DIAGNOSIS — I10 Essential (primary) hypertension: Secondary | ICD-10-CM | POA: Diagnosis present

## 2023-06-21 DIAGNOSIS — J690 Pneumonitis due to inhalation of food and vomit: Secondary | ICD-10-CM | POA: Diagnosis present

## 2023-06-21 DIAGNOSIS — Z96642 Presence of left artificial hip joint: Secondary | ICD-10-CM | POA: Diagnosis present

## 2023-06-21 DIAGNOSIS — J441 Chronic obstructive pulmonary disease with (acute) exacerbation: Secondary | ICD-10-CM | POA: Diagnosis present

## 2023-06-21 LAB — CBC WITH DIFFERENTIAL/PLATELET
Abs Immature Granulocytes: 2.61 10*3/uL — ABNORMAL HIGH (ref 0.00–0.07)
Basophils Absolute: 0.1 10*3/uL (ref 0.0–0.1)
Basophils Relative: 1 %
Eosinophils Absolute: 0.1 10*3/uL (ref 0.0–0.5)
Eosinophils Relative: 0 %
HCT: 26.5 % — ABNORMAL LOW (ref 36.0–46.0)
Hemoglobin: 8.7 g/dL — ABNORMAL LOW (ref 12.0–15.0)
Immature Granulocytes: 13 %
Lymphocytes Relative: 6 %
Lymphs Abs: 1.2 10*3/uL (ref 0.7–4.0)
MCH: 30.4 pg (ref 26.0–34.0)
MCHC: 32.8 g/dL (ref 30.0–36.0)
MCV: 92.7 fL (ref 80.0–100.0)
Monocytes Absolute: 1.5 10*3/uL — ABNORMAL HIGH (ref 0.1–1.0)
Monocytes Relative: 7 %
Neutro Abs: 15.4 10*3/uL — ABNORMAL HIGH (ref 1.7–7.7)
Neutrophils Relative %: 73 %
Platelets: 465 10*3/uL — ABNORMAL HIGH (ref 150–400)
RBC: 2.86 MIL/uL — ABNORMAL LOW (ref 3.87–5.11)
RDW: 14.7 % (ref 11.5–15.5)
Smear Review: NORMAL
WBC: 20.9 10*3/uL — ABNORMAL HIGH (ref 4.0–10.5)
nRBC: 0 % (ref 0.0–0.2)

## 2023-06-21 LAB — COMPREHENSIVE METABOLIC PANEL WITH GFR
ALT: 10 U/L (ref 0–44)
AST: 20 U/L (ref 15–41)
Albumin: 2.8 g/dL — ABNORMAL LOW (ref 3.5–5.0)
Alkaline Phosphatase: 98 U/L (ref 38–126)
Anion gap: 17 — ABNORMAL HIGH (ref 5–15)
BUN: 105 mg/dL — ABNORMAL HIGH (ref 8–23)
CO2: 12 mmol/L — ABNORMAL LOW (ref 22–32)
Calcium: 9.1 mg/dL (ref 8.9–10.3)
Chloride: 106 mmol/L (ref 98–111)
Creatinine, Ser: 3.37 mg/dL — ABNORMAL HIGH (ref 0.44–1.00)
GFR, Estimated: 13 mL/min — ABNORMAL LOW (ref >60)
Glucose, Bld: 108 mg/dL — ABNORMAL HIGH (ref 70–99)
Potassium: 5.2 mmol/L — ABNORMAL HIGH (ref 3.5–5.1)
Sodium: 135 mmol/L (ref 135–145)
Total Bilirubin: 1 mg/dL (ref 0.0–1.2)
Total Protein: 7.1 g/dL (ref 6.5–8.1)

## 2023-06-21 LAB — URINALYSIS, W/ REFLEX TO CULTURE (INFECTION SUSPECTED)
Bilirubin Urine: NEGATIVE
Glucose, UA: NEGATIVE mg/dL
Hgb urine dipstick: NEGATIVE
Ketones, ur: NEGATIVE mg/dL
Nitrite: NEGATIVE
Protein, ur: 30 mg/dL — AB
Specific Gravity, Urine: 1.013 (ref 1.005–1.030)
WBC, UA: >50 WBC/hpf (ref 0–5)
pH: 5 (ref 5.0–8.0)

## 2023-06-21 LAB — PROTIME-INR
INR: 1.2 (ref 0.8–1.2)
Prothrombin Time: 15.3 s — ABNORMAL HIGH (ref 11.4–15.2)

## 2023-06-21 LAB — LACTIC ACID, PLASMA: Lactic Acid, Venous: 1.1 mmol/L (ref 0.5–1.9)

## 2023-06-21 LAB — BRAIN NATRIURETIC PEPTIDE: B Natriuretic Peptide: 500.4 pg/mL — ABNORMAL HIGH (ref 0.0–100.0)

## 2023-06-21 LAB — PROCALCITONIN: Procalcitonin: 53.47 ng/mL

## 2023-06-21 MED ORDER — HYDRALAZINE HCL 20 MG/ML IJ SOLN: 5.0000 mg | INTRAMUSCULAR | Status: DC | PRN

## 2023-06-21 MED ORDER — SODIUM CHLORIDE 0.9 % IV SOLN
100.0000 mg | Freq: Two times a day (BID) | INTRAVENOUS | Status: DC
  Administered 2023-06-21 – 2023-06-27 (×13): 100 mg via INTRAVENOUS
  Filled 2023-06-21 (×14): qty 100

## 2023-06-21 MED ORDER — DM-GUAIFENESIN ER 30-600 MG PO TB12: 1.0000 | ORAL_TABLET | Freq: Two times a day (BID) | ORAL | Status: DC | PRN

## 2023-06-21 MED ORDER — SODIUM CHLORIDE 0.9 % IV SOLN: 1.0000 g | INTRAVENOUS | Status: DC

## 2023-06-21 MED ORDER — ACETAMINOPHEN 325 MG PO TABS: 650.0000 mg | ORAL_TABLET | Freq: Four times a day (QID) | ORAL | Status: DC | PRN

## 2023-06-21 MED ORDER — ONDANSETRON HCL 4 MG/2ML IJ SOLN
4.0000 mg | Freq: Three times a day (TID) | INTRAMUSCULAR | Status: DC | PRN
  Filled 2023-06-21: qty 2

## 2023-06-21 MED ORDER — SODIUM CHLORIDE 0.9 % IV SOLN
1.0000 g | Freq: Once | INTRAVENOUS | Status: AC
  Administered 2023-06-21: 1 g via INTRAVENOUS
  Filled 2023-06-21: qty 10

## 2023-06-21 MED ORDER — HEPARIN SODIUM (PORCINE) 5000 UNIT/ML IJ SOLN
5000.0000 [IU] | Freq: Three times a day (TID) | INTRAMUSCULAR | Status: DC
  Administered 2023-06-21 – 2023-07-04 (×38): 5000 [IU] via SUBCUTANEOUS
  Filled 2023-06-21 (×38): qty 1

## 2023-06-21 MED ORDER — SODIUM CHLORIDE 0.9 % IV BOLUS
1000.0000 mL | Freq: Once | INTRAVENOUS | Status: AC
  Administered 2023-06-21: 1000 mL via INTRAVENOUS

## 2023-06-21 MED ORDER — ALBUTEROL SULFATE (2.5 MG/3ML) 0.083% IN NEBU: 2.5000 mg | INHALATION_SOLUTION | RESPIRATORY_TRACT | Status: DC | PRN

## 2023-06-21 NOTE — ED Triage Notes (Signed)
 BIBA:  Pt complained of SOB with exertion that started 3 days ago.  Pt thought she had a kidney infection due to back pain, urine smell and pain with urination. Most of it has resolved except back pain.  Hx of kidney issues.  Dr is in [place].  EMS stated she has rhonchi bilateral lower lobes.  Per EMS: BG was 63 - fire dept gave pineapple juice and increased to 96 94 - 97 on RA HR 111 107/65 T 98.3  Hx of COPD

## 2023-06-21 NOTE — ED Provider Notes (Signed)
 [facility] Provider Note    Event Date/Time   First MD Initiated Contact with Patient 06/21/23 1920     (approximate)   History   Low blood pressure   HPI  Eileen Wright is a 83 y.o. female who presents to the emergency department today because of concerns for low blood pressure.  Patient states she started not feeling well a little over a week ago.  She thinks that she might have had a urinary tract infection.  She developed back pain and burning with urination.  She did not seek care for this and since then the burning with urination has improved although she continues to have back pain.  Then in the middle of last week she started developing increasing shortness of breath and asthma.  She was using her inhaler that did give her some relief.  Today family checked her blood pressure because of weakness and found it was low.  That is what prompted 911 to be called.     Physical Exam   Triage Vital Signs: ED Triage Vitals  Encounter Vitals Group     BP --      Girls Systolic BP Percentile --      Girls Diastolic BP Percentile --      Boys Systolic BP Percentile --      Boys Diastolic BP Percentile --      Pulse Rate 06/21/23 1932 (!) 106     Resp 06/21/23 1932 19     Temp 06/21/23 1932 98.1 F (36.7 C)     Temp Source 06/21/23 1932 Oral     SpO2 06/21/23 1932 97 %     Weight 06/21/23 1928 147 lb 0.8 oz (66.7 kg)     Height 06/21/23 1928 5' 2 (1.575 m)     Head Circumference --      Peak Flow --      Pain Score 06/21/23 1928 0     Pain Loc --      Pain Education --      Exclude from Growth Chart --     Most recent vital signs: Vitals:   06/21/23 1932  Pulse: (!) 106  Resp: 19  Temp: 98.1 F (36.7 C)  SpO2: 97%   General: Awake, alert, oriented. CV:  Good peripheral perfusion. Tachycardia, regular rhythm. Resp:  Normal effort. Lungs clear. Abd:  No distention.    ED Results / Procedures / Treatments   Labs (all labs ordered are  listed, but only abnormal results are displayed) Labs Reviewed - No data to display   EKG  I, Marylynn Soho, attending physician, personally viewed and interpreted this EKG  EKG Time: *** Rate: *** Rhythm: *** Axis: *** Intervals: qtc *** QRS: *** ST changes: *** Impression: ***    RADIOLOGY I independently interpreted and visualized the CXR. My interpretation: *** Radiology interpretation: ***    PROCEDURES:  Critical Care performed: Yes  CRITICAL CARE Performed by: Marylynn Soho   Total critical care time: *** minutes  Critical care time was exclusive of separately billable procedures and treating other patients.  Critical care was necessary to treat or prevent imminent or life-threatening deterioration.  Critical care was time spent personally by me on the following activities: development of treatment plan with patient and/or surrogate as well as nursing, discussions with consultants, evaluation of patient's response to treatment, examination of patient, obtaining history from patient or surrogate, ordering and performing treatments and interventions, ordering and review of laboratory  studies, ordering and review of radiographic studies, pulse oximetry and re-evaluation of patient's condition.   Procedures    MEDICATIONS ORDERED IN ED: Medications - No data to display   IMPRESSION / MDM / ASSESSMENT AND PLAN / ED COURSE  I reviewed the triage vital signs and the nursing notes.                              Differential diagnosis includes, but is not limited to, ***  Patient's presentation is most consistent with {EM COPA:27473}   ***The patient is on the cardiac monitor to evaluate for evidence of arrhythmia and/or significant heart rate changes.  ***      FINAL CLINICAL IMPRESSION(S) / ED DIAGNOSES   Final diagnoses:  None        Rx / DC Orders   ED Discharge Orders     None        Note:  This document was prepared using  Dragon voice recognition software and may include unintentional dictation errors.

## 2023-06-21 NOTE — H&P (Incomplete)
 History and Physical    MYCA PERNO ZOX:[id number] DOB: [birth year] DOA: 06/21/2023  Referring MD/NP/PA:   PCP: Vince Grebe, MD   Patient coming from:  The patient is coming from home.     Chief Complaint: Cough, SOB  HPI: Eileen Wright is a 83 y.o. female with medical history significant of COPD, HTN, HLD, dCHF, hypothyroidism, CKD 3B, anemia, former smoker, who presents with cough and SOB.  Patient states that she has SOB and cough with greenish colored sputum production in the past 3 days.  No chest pain, fever or chills.  The shortness breath has been progressively worsening.  Patient had 1 loose stool bowel movement this morning, currently no nausea, vomiting, abdominal pain or diarrhea.  Patient reports dysuria, burning urination and left flank pain for several days.  Note, patient has received 1 L normal saline bolus in ED, her shortness of breath did not get worse, but slightly improved per patient.  Data reviewed independently and ED Course: pt was found to have WBC 20.9, BNP 500, lactic acid 11.1, UA (cloudy appearance, large amount of leukocytes, many bacteria, WBC >50, squamous cells 11-20), INR 9.2, potassium 5.2, worsening renal function with creatinine 3.37, BUN 105 and a GFR 13 (recent baseline creatinine 2.23 on 05/24/2023), chest x-ray showed bilateral lower  lower opacity.  Patient is admitted to telemetry bed as inpatient.  CXR: 1. Small bilateral pleural effusions. 2. Bilateral lower lobe opacities, atelectasis versus pneumonia.  EKG: I have personally reviewed.     Review of Systems:   General: no fevers, chills, no body weight gain, has fatigue HEENT: no blurry vision, hearing changes or sore throat Respiratory: has dyspnea, coughing, no wheezing CV: no chest pain, no palpitations GI: no nausea, vomiting, abdominal pain, diarrhea, constipation GU: has dysuria, burning on urination,  no increased urinary frequency, hematuria  Ext: has leg edema Neuro: no  unilateral weakness, numbness, or tingling, no vision change or hearing loss Skin: no rash, no skin tear. MSK: No muscle spasm, no deformity, no limitation of range of movement in spin. Has left flank pain Heme: No easy bruising.  Travel history: No recent long distant travel.   Allergy:  Allergies  Allergen Reactions   Epinephrine  Hives   Vancomycin Hives   Penicillins     Redness around injection site    Past Medical History:  Diagnosis Date   Anemia    Arthritis    Cardiomyopathy (HCC)    COPD (chronic obstructive pulmonary disease) (HCC)    GERD (gastroesophageal reflux disease)    Heart murmur    Hyperlipemia    Hypertension    Hypothyroidism    Mitral valve insufficiency    Pneumonia     Past Surgical History:  Procedure Laterality Date   APPENDECTOMY     BREAST BIOPSY     TONSILLECTOMY     adenoids   TOTAL HIP ARTHROPLASTY Left 12/03/2020   Procedure: TOTAL HIP ARTHROPLASTY ANTERIOR APPROACH;  Surgeon: Adonica Hoose, MD;  Location: [facility];  Service: Orthopedics;  Laterality: Left;    Social History:  reports that she has quit smoking. Her smoking use included cigarettes. She has never used smokeless tobacco. She reports that she does not drink alcohol  and does not use drugs.  Family History:  Family History  Problem Relation Age of Onset   Heart failure Mother    Throat cancer Father    Bladder Cancer Sister      Prior to Admission medications  Medication Sig Start Date End Date Taking? Authorizing Provider  acetaminophen  (TYLENOL ) 325 MG tablet Take 1-2 tablets (325-650 mg total) by mouth every 6 (six) hours as needed for mild pain (pain score 1-3 or temp > 100.5). 12/04/20   Elisa Guest, PA-C  albuterol  (VENTOLIN  HFA) 108 (90 Base) MCG/ACT inhaler Inhale 1-2 puffs into the lungs every 6 (six) hours as needed for shortness of breath. 07/30/20   [provider]  atorvastatin  (LIPITOR) 10 MG tablet Take 10 mg by mouth daily. 07/26/20    [provider]  cetirizine (ZYRTEC) 10 MG tablet Take 10 mg by mouth daily.    [provider]  Cholecalciferol  (VITAMIN D3) 125 MCG (5000 UT) CAPS Take 5,000 Units by mouth daily.    [provider]  docusate sodium  (COLACE) 100 MG capsule Take 1 capsule (100 mg total) by mouth 2 (two) times daily. 12/04/20   Elisa Guest, PA-C  ferrous sulfate  325 (65 FE) MG tablet Take 1 tablet (325 mg total) by mouth 2 (two) times daily with a meal. 12/07/20 01/06/21  Elisa Guest, PA-C  fluticasone-salmeterol (ADVAIR) 500-50 MCG/ACT AEPB Inhale 1 puff into the lungs in the morning and at bedtime.    [provider]  HYDROcodone -acetaminophen  (NORCO/VICODIN) 5-325 MG tablet Take 1-2 tablets by mouth every 4 (four) hours as needed for moderate pain (pain score 4-6). 12/04/20   Elisa Guest, PA-C  lisinopril-hydrochlorothiazide (ZESTORETIC) 10-12.5 MG tablet Take 1 tablet by mouth daily. 07/02/20   [provider]  magnesium  oxide (MAG-OX) 400 (240 Mg) MG tablet Take 400 mg by mouth daily. 04/26/20   [provider]  metoprolol  succinate (TOPROL -XL) 25 MG 24 hr tablet Take 25 mg by mouth daily.    [provider]  Multiple Vitamin (MULTI-VITAMINS) TABS Take 1 tablet by mouth daily.    [provider]  omeprazole (PRILOSEC) 20 MG capsule Take 20 mg by mouth daily.    [provider]  ondansetron  (ZOFRAN ) 4 MG tablet Take 1 tablet (4 mg total) by mouth every 6 (six) hours as needed for nausea. 12/04/20   Elisa Guest, PA-C  senna (SENOKOT) 8.6 MG TABS tablet Take 1 tablet (8.6 mg total) by mouth 2 (two) times daily. 12/04/20   Elisa Guest, PA-C  SPIRIVA  HANDIHALER 18 MCG inhalation capsule Place 18 mcg into inhaler and inhale daily. 07/30/20   [provider]  spironolactone  (ALDACTONE ) 25 MG tablet Take 25 mg by mouth daily. 05/19/20   [provider]  SYNTHROID  125 MCG tablet Take 125 mcg by mouth  every Monday, Wednesday, and Friday. 05/03/20   [provider]  SYNTHROID  137 MCG tablet Take 137 mcg by mouth every Tuesday, Thursday, Saturday, and Sunday. 06/21/20   [provider]  torsemide  (DEMADEX ) 20 MG tablet Take 20 mg by mouth daily. 06/01/20   [provider]    Physical Exam: Vitals:   06/21/23 1928 06/21/23 1932 06/21/23 1945 06/22/23 0042  BP:   132/63   Pulse:  (!) 106    Resp:  19    Temp:  98.1 F (36.7 C)  98.3 F (36.8 C)  TempSrc:  Oral  Oral  SpO2:  97%    Weight: 66.7 kg     Height: 5' 2 (1.575 m)      General: Not in acute distress HEENT:       Eyes: PERRL, EOMI, no jaundice       ENT: No discharge from  the ears and nose, no pharynx injection, no tonsillar enlargement.        Neck: No JVD, no bruit, no mass felt. Heme: No neck lymph node enlargement. Cardiac: S1/S2, RRR, No gallops or rubs. Respiratory: Has crackles, coarse sounds bilaterally, no wheezing GI: Soft, nondistended, nontender, no rebound pain, no organomegaly, BS present. GU: No hematuria Ext: 1+ pitting leg edema bilaterally. 1+DP/PT pulse bilaterally. Musculoskeletal: No joint deformities, No joint redness or warmth, no limitation of ROM in spin.  Has left flank tenderness Skin: No rashes.  Neuro: Alert, oriented X3, cranial nerves II-XII grossly intact, moves all extremities normally.  Psych: Patient is not psychotic, no suicidal or hemocidal ideation.  Labs on Admission: I have personally reviewed following labs and imaging studies  CBC: Recent Labs  Lab 06/21/23 1955  WBC 20.9*  NEUTROABS 15.4*  HGB 8.7*  HCT 26.5*  MCV 92.7  PLT 465*   Basic Metabolic Panel: Recent Labs  Lab 06/21/23 1955  NA 135  K 5.2*  CL 106  CO2 12*  GLUCOSE 108*  BUN 105*  CREATININE 3.37*  CALCIUM  9.1   GFR: Estimated Creatinine Clearance: 11.5 mL/min (A) (by C-G formula based on SCr of 3.37 mg/dL (H)). Liver Function Tests: Recent Labs  Lab 06/21/23 1955   AST 20  ALT 10  ALKPHOS 98  BILITOT 1.0  PROT 7.1  ALBUMIN  2.8*   No results for input(s): LIPASE, AMYLASE in the last 168 hours. No results for input(s): AMMONIA in the last 168 hours. Coagulation Profile: Recent Labs  Lab 06/21/23 1955  INR 1.2   Cardiac Enzymes: No results for input(s): CKTOTAL, CKMB, CKMBINDEX, TROPONINI in the last 168 hours. BNP (last 3 results) No results for input(s): PROBNP in the last 8760 hours. HbA1C: No results for input(s): HGBA1C in the last 72 hours. CBG: No results for input(s): GLUCAP in the last 168 hours. Lipid Profile: No results for input(s): CHOL, HDL, LDLCALC, TRIG, CHOLHDL, LDLDIRECT in the last 72 hours. Thyroid Function Tests: No results for input(s): TSH, T4TOTAL, FREET4, T3FREE, THYROIDAB in the last 72 hours. Anemia Panel: No results for input(s): VITAMINB12, FOLATE, FERRITIN, TIBC, IRON, RETICCTPCT in the last 72 hours. Urine analysis:    Component Value Date/Time   COLORURINE YELLOW (A) 06/21/2023 1955   APPEARANCEUR CLOUDY (A) 06/21/2023 1955   LABSPEC 1.013 06/21/2023 1955   PHURINE 5.0 06/21/2023 1955   GLUCOSEU NEGATIVE 06/21/2023 1955   HGBUR NEGATIVE 06/21/2023 1955   BILIRUBINUR NEGATIVE 06/21/2023 1955   KETONESUR NEGATIVE 06/21/2023 1955   PROTEINUR 30 (A) 06/21/2023 1955   NITRITE NEGATIVE 06/21/2023 1955   LEUKOCYTESUR LARGE (A) 06/21/2023 1955   Sepsis Labs: @LABRCNTIP (procalcitonin:4,lacticidven:4) )No results found for this or any previous visit (from the past 240 hours).   Radiological Exams on Admission:   Assessment/Plan Principal Problem:   CAP (community acquired pneumonia) Active Problems:   COPD (chronic obstructive pulmonary disease) (HCC)   Chronic diastolic CHF (congestive heart failure) (HCC)   Hypothyroidism   Hypertension   Acute pyelonephritis   Acute renal failure superimposed on stage 3b chronic kidney disease (HCC)   HLD  (hyperlipidemia)   Anemia in chronic kidney disease (CKD)   Hyperkalemia   Overweight (BMI 25.0-29.9)   Assessment and Plan:  CAP (community acquired pneumonia): Patient has productive cough, SOB, chest x-ray showed bilateral lower lobe opacity, indicating CAP.  No new oxygen requirement.  Patient has history of CHF, 1+ leg edema and elevated BNP 500, indicating mild fluid overload, however  patient received 1 L normal saline in ED, her shortness of shortness did not get worse, but has improved, including CHF does not play major room for her shortness of breath.  Due to worsening renal function, will not start diuretics.  Patient has WBC 20.9, no fever, lactic acid 11.1, clinically does not seem to have sepsis.  - Will admit to tele bed   as inpt - IV Rocephin and doxycycline - Incentive spirometry - Mucinex for cough  - Bronchodilators - Urine legionella and S. pneumococcal antigen - Follow up blood culture x2, sputum culture - Check RSEP panel - IVF: 1L of NS bolus in ED  COPD (chronic obstructive pulmonary disease) (HCC): no wheezing. - Bronchodilators as needed Mucinex  Chronic diastolic CHF (congestive heart failure) (HCC): 2D echo 02/01/2022 showed EF > 55% with grade 1 diastolic dysfunction, patient has mild fluid overload as discussed above -Holding spironolactone  and torsemide  due to worsening renal function  Hypothyroidism -Synthroid   Hypertension -Hold her Zestoretic, torsemide  and spironolactone  due to worsening renal function - Continue metoprolol  - IV hydralazine as needed  Acute pyelonephritis: Patient has a positive urinalysis, typical symptoms for urinary infection, with left flank pain, indicating acute pyelonephritis. - IV Rocephin - Follow-up urine culture - Follow-up CT-renal stone to rule out kidney stone  Acute renal failure superimposed on stage 3b chronic kidney disease (HCC): Likely due to pyelonephritis and continuation of her diuretics - Hold  torsemide , spironolactone  and Zestoretic - Follow-up CT scan which is ordered due to pyelonephritis  HLD (hyperlipidemia) - Lipitor  Anemia in chronic kidney disease (CKD): Hemoglobin 8.7 (10.5 on 02/11/2022) - Follow-up with CBC  Hyperkalemia: Potassium 5.2 which is likely due to bladder sample hemolysis - Repeat potassium level  Overweight (BMI 25.0-29.9): Body weight 66.7 kg, BMI 26.90 - Encourage losing weight - Exercise healthy diet      DVT ppx: SQ Heparin    Code Status: Full code   Family Communication:    Yes, patient's daughter   at bed side.     Disposition Plan:  Anticipate discharge back to previous environment  Consults called:  none  Admission status and Level of care: Telemetry Medical: as inpt        Dispo: The patient is from: Home              Anticipated d/c is to: Home              Anticipated d/c date is: 2 days              Patient currently is not medically stable to d/c.    Severity of Illness:  The appropriate patient status for this patient is INPATIENT. Inpatient status is judged to be reasonable and necessary in order to provide the required intensity of service to ensure the patient's safety. The patient's presenting symptoms, physical exam findings, and initial radiographic and laboratory data in the context of their chronic comorbidities is felt to place them at high risk for further clinical deterioration. Furthermore, it is not anticipated that the patient will be medically stable for discharge from the hospital within 2 midnights of admission.   * I certify that at the point of admission it is my clinical judgment that the patient will require inpatient hospital care spanning beyond 2 midnights from the point of admission due to high intensity of service, high risk for further deterioration and high frequency of surveillance required.*       Date of Service 06/22/2023  Cyara Devoto [facility]   If 7PM-7AM, please  contact night-coverage [email or web address] 06/22/2023, 1:07 AM

## 2023-06-22 ENCOUNTER — Encounter: Payer: Self-pay | Admitting: Internal Medicine

## 2023-06-22 ENCOUNTER — Inpatient Hospital Stay

## 2023-06-22 DIAGNOSIS — E872 Acidosis, unspecified: Secondary | ICD-10-CM | POA: Diagnosis present

## 2023-06-22 DIAGNOSIS — I429 Cardiomyopathy, unspecified: Secondary | ICD-10-CM | POA: Diagnosis present

## 2023-06-22 DIAGNOSIS — D75838 Other thrombocytosis: Secondary | ICD-10-CM | POA: Diagnosis present

## 2023-06-22 DIAGNOSIS — I050 Rheumatic mitral stenosis: Secondary | ICD-10-CM | POA: Diagnosis present

## 2023-06-22 DIAGNOSIS — I82612 Acute embolism and thrombosis of superficial veins of left upper extremity: Secondary | ICD-10-CM | POA: Diagnosis not present

## 2023-06-22 DIAGNOSIS — N10 Acute tubulo-interstitial nephritis: Secondary | ICD-10-CM | POA: Diagnosis present

## 2023-06-22 DIAGNOSIS — I130 Hypertensive heart and chronic kidney disease with heart failure and stage 1 through stage 4 chronic kidney disease, or unspecified chronic kidney disease: Secondary | ICD-10-CM | POA: Diagnosis present

## 2023-06-22 DIAGNOSIS — A419 Sepsis, unspecified organism: Secondary | ICD-10-CM

## 2023-06-22 DIAGNOSIS — D631 Anemia in chronic kidney disease: Secondary | ICD-10-CM | POA: Diagnosis present

## 2023-06-22 DIAGNOSIS — E663 Overweight: Secondary | ICD-10-CM | POA: Diagnosis present

## 2023-06-22 DIAGNOSIS — E1122 Type 2 diabetes mellitus with diabetic chronic kidney disease: Secondary | ICD-10-CM | POA: Diagnosis present

## 2023-06-22 DIAGNOSIS — N179 Acute kidney failure, unspecified: Secondary | ICD-10-CM | POA: Diagnosis present

## 2023-06-22 DIAGNOSIS — R1310 Dysphagia, unspecified: Secondary | ICD-10-CM | POA: Diagnosis present

## 2023-06-22 DIAGNOSIS — J449 Chronic obstructive pulmonary disease, unspecified: Secondary | ICD-10-CM | POA: Diagnosis not present

## 2023-06-22 DIAGNOSIS — J440 Chronic obstructive pulmonary disease with acute lower respiratory infection: Secondary | ICD-10-CM | POA: Diagnosis present

## 2023-06-22 DIAGNOSIS — J441 Chronic obstructive pulmonary disease with (acute) exacerbation: Secondary | ICD-10-CM | POA: Diagnosis present

## 2023-06-22 DIAGNOSIS — J690 Pneumonitis due to inhalation of food and vomit: Secondary | ICD-10-CM | POA: Diagnosis present

## 2023-06-22 DIAGNOSIS — J189 Pneumonia, unspecified organism: Secondary | ICD-10-CM | POA: Diagnosis present

## 2023-06-22 DIAGNOSIS — I5031 Acute diastolic (congestive) heart failure: Secondary | ICD-10-CM | POA: Diagnosis not present

## 2023-06-22 DIAGNOSIS — X58XXXA Exposure to other specified factors, initial encounter: Secondary | ICD-10-CM | POA: Diagnosis not present

## 2023-06-22 DIAGNOSIS — E039 Hypothyroidism, unspecified: Secondary | ICD-10-CM | POA: Diagnosis present

## 2023-06-22 DIAGNOSIS — E875 Hyperkalemia: Secondary | ICD-10-CM | POA: Diagnosis present

## 2023-06-22 DIAGNOSIS — A4152 Sepsis due to Pseudomonas: Secondary | ICD-10-CM | POA: Diagnosis present

## 2023-06-22 DIAGNOSIS — T17990A Other foreign object in respiratory tract, part unspecified in causing asphyxiation, initial encounter: Secondary | ICD-10-CM | POA: Diagnosis not present

## 2023-06-22 DIAGNOSIS — E785 Hyperlipidemia, unspecified: Secondary | ICD-10-CM | POA: Diagnosis present

## 2023-06-22 DIAGNOSIS — Z1152 Encounter for screening for COVID-19: Secondary | ICD-10-CM | POA: Diagnosis not present

## 2023-06-22 DIAGNOSIS — I5032 Chronic diastolic (congestive) heart failure: Secondary | ICD-10-CM | POA: Diagnosis present

## 2023-06-22 DIAGNOSIS — N1832 Chronic kidney disease, stage 3b: Secondary | ICD-10-CM | POA: Diagnosis present

## 2023-06-22 LAB — BLOOD CULTURE ID PANEL (REFLEXED) - BCID2

## 2023-06-22 LAB — CBC
HCT: 32.8 % — ABNORMAL LOW (ref 36.0–46.0)
Hemoglobin: 10.6 g/dL — ABNORMAL LOW (ref 12.0–15.0)
MCH: 30.4 pg (ref 26.0–34.0)
MCHC: 32.3 g/dL (ref 30.0–36.0)
MCV: 94 fL (ref 80.0–100.0)
Platelets: 548 10*3/uL — ABNORMAL HIGH (ref 150–400)
RBC: 3.49 MIL/uL — ABNORMAL LOW (ref 3.87–5.11)
RDW: 15 % (ref 11.5–15.5)
WBC: 24 10*3/uL — ABNORMAL HIGH (ref 4.0–10.5)
nRBC: 0 % (ref 0.0–0.2)

## 2023-06-22 LAB — BASIC METABOLIC PANEL WITH GFR
Anion gap: 10 (ref 5–15)
BUN: 98 mg/dL — ABNORMAL HIGH (ref 8–23)
CO2: 15 mmol/L — ABNORMAL LOW (ref 22–32)
Calcium: 8.7 mg/dL — ABNORMAL LOW (ref 8.9–10.3)
Chloride: 112 mmol/L — ABNORMAL HIGH (ref 98–111)
Creatinine, Ser: 2.88 mg/dL — ABNORMAL HIGH (ref 0.44–1.00)
GFR, Estimated: 16 mL/min — ABNORMAL LOW (ref >60)
Glucose, Bld: 69 mg/dL — ABNORMAL LOW (ref 70–99)
Potassium: 4.5 mmol/L (ref 3.5–5.1)
Sodium: 137 mmol/L (ref 135–145)

## 2023-06-22 LAB — RESP PANEL BY RT-PCR (RSV, FLU A&B, COVID)  RVPGX2
Influenza A by PCR: NEGATIVE
Influenza B by PCR: NEGATIVE
Resp Syncytial Virus by PCR: NEGATIVE
SARS Coronavirus 2 by RT PCR: NEGATIVE

## 2023-06-22 LAB — EXPECTORATED SPUTUM ASSESSMENT W GRAM STAIN, RFLX TO RESP C

## 2023-06-22 LAB — POTASSIUM: Potassium: 4.5 mmol/L (ref 3.5–5.1)

## 2023-06-22 LAB — STREP PNEUMONIAE URINARY ANTIGEN: Strep Pneumo Urinary Antigen: NEGATIVE

## 2023-06-22 MED ORDER — METOPROLOL SUCCINATE ER 25 MG PO TB24
25.0000 mg | ORAL_TABLET | Freq: Every day | ORAL | Status: DC
  Administered 2023-06-23: 25 mg via ORAL
  Filled 2023-06-22 (×2): qty 1

## 2023-06-22 MED ORDER — BUDESON-GLYCOPYRROL-FORMOTEROL 160-9-4.8 MCG/ACT IN AERO
2.0000 | INHALATION_SPRAY | Freq: Two times a day (BID) | RESPIRATORY_TRACT | Status: DC
  Administered 2023-06-22 – 2023-07-04 (×25): 2 via RESPIRATORY_TRACT
  Filled 2023-06-22 (×2): qty 5.9

## 2023-06-22 MED ORDER — FLUTICASONE FUROATE-VILANTEROL 200-25 MCG/ACT IN AEPB: 1.0000 | INHALATION_SPRAY | Freq: Every day | RESPIRATORY_TRACT | Status: DC

## 2023-06-22 MED ORDER — SODIUM CHLORIDE 0.9 % IV SOLN
2.0000 g | INTRAVENOUS | Status: DC
  Administered 2023-06-22 – 2023-06-26 (×5): 2 g via INTRAVENOUS
  Filled 2023-06-22 (×5): qty 12.5

## 2023-06-22 MED ORDER — LORATADINE 10 MG PO TABS
10.0000 mg | ORAL_TABLET | Freq: Every day | ORAL | Status: DC
  Administered 2023-06-22 – 2023-07-04 (×13): 10 mg via ORAL
  Filled 2023-06-22 (×13): qty 1

## 2023-06-22 MED ORDER — PANTOPRAZOLE SODIUM 40 MG PO TBEC
40.0000 mg | DELAYED_RELEASE_TABLET | Freq: Every day | ORAL | Status: DC
  Administered 2023-06-22 – 2023-07-02 (×11): 40 mg via ORAL
  Filled 2023-06-22 (×11): qty 1

## 2023-06-22 MED ORDER — VITAMIN D 25 MCG (1000 UNIT) PO TABS
5000.0000 [IU] | ORAL_TABLET | Freq: Every day | ORAL | Status: DC
  Administered 2023-06-22 – 2023-07-04 (×13): 5000 [IU] via ORAL
  Filled 2023-06-22 (×13): qty 5

## 2023-06-22 MED ORDER — TIOTROPIUM BROMIDE MONOHYDRATE 18 MCG IN CAPS: 18.0000 ug | ORAL_CAPSULE | Freq: Every day | RESPIRATORY_TRACT | Status: DC

## 2023-06-22 MED ORDER — DOCUSATE SODIUM 100 MG PO CAPS
100.0000 mg | ORAL_CAPSULE | Freq: Two times a day (BID) | ORAL | Status: DC | PRN
  Administered 2023-06-26: 100 mg via ORAL
  Filled 2023-06-22: qty 1

## 2023-06-22 MED ORDER — ATORVASTATIN CALCIUM 10 MG PO TABS
10.0000 mg | ORAL_TABLET | Freq: Every day | ORAL | Status: DC
  Administered 2023-06-22 – 2023-07-03 (×13): 10 mg via ORAL
  Filled 2023-06-22 (×13): qty 1

## 2023-06-22 MED ORDER — LEVOTHYROXINE SODIUM 50 MCG PO TABS
100.0000 ug | ORAL_TABLET | Freq: Every day | ORAL | Status: DC
  Administered 2023-06-22 – 2023-07-04 (×13): 100 ug via ORAL
  Filled 2023-06-22 (×13): qty 2

## 2023-06-22 NOTE — Assessment & Plan Note (Signed)
 Hemoglobin seems stable at 10.6 today which is around her baseline. - Monitor hemoglobin -Transfuse if below 7

## 2023-06-22 NOTE — Plan of Care (Signed)
  Problem: Education: Goal: Knowledge of General Education information will improve Description: Including pain rating scale, medication(s)/side effects and non-pharmacologic comfort measures Outcome: Progressing   Problem: Pain Managment: Goal: General experience of comfort will improve and/or be controlled Outcome: Progressing   Problem: Safety: Goal: Ability to remain free from injury will improve Outcome: Progressing   Problem: Skin Integrity: Goal: Risk for impaired skin integrity will decrease Outcome: Progressing   Problem: Respiratory: Goal: Ability to maintain adequate ventilation will improve Outcome: Progressing

## 2023-06-22 NOTE — ED Notes (Signed)
 Pt is a high fall risk. Pt has fall risk bracelet, non skid socks, and bed alarm on at this time.

## 2023-06-22 NOTE — Progress Notes (Signed)
 Progress Note   Patient: Eileen Wright YQM:[id number] DOB: [birth year] DOA: 06/21/2023     0 DOS: the patient was seen and examined on 06/22/2023   Brief hospital course: Taken from H&P.  ADELAIDE PFEFFERKORN is a 83 y.o. female with medical history significant of COPD, HTN, HLD, dCHF, hypothyroidism, CKD 3B, anemia, former smoker, who presents with cough with greenish sputum production and SOB for 3 days. Patient also reports dysuria and burning micturition with left flank pain.  On presentation patient had mild tachycardia and tachypnea, labs with leukocytosis at 20.9, BNP 500, lactic acid 1.1, CO2 of 12, potassium 5.2, AKI with creatinine at 3.37, BUN 105, recent baseline creatinine of 2.23 on 05/24/2023. Chest x-ray with bilateral lower lobe opacity and small bilateral pleural effusions.  Patient received IV fluid, started on Rocephin and doxycycline for pneumonia.  Cultures were sent.  6/19: Vital stable, on room air, labs with worsening leukocytosis at 24, hemoglobin at 10.6-all cell line increased.  Hyperkalemia resolved with potassium at 4.5, CO2 of 15, BUN 98 and some improvement of creatinine to 2.88.  Procalcitonin significantly elevated at 53.47, strep pneumo negative. Urine, sputum and blood cultures pending.  Broadening antibiotics to cefepime while waiting for cultures.  Assessment and Plan: * Sepsis due to pneumonia [facility]) Patient met sepsis criteria with leukocytosis, tachycardia and tachypnea.  Mild AKI, no lactic acidosis.  Likely secondary to pneumonia.  UA with concern of UTI.  Blood, urine and sputum cultures pending.  Significantly elevated procalcitonin with worsening leukocytosis today.  Strep pneumo negative - Broadening of antibiotics to cefepime from ceftriaxone -Continue with doxycycline -Follow-up culture results-we will de-escalate according to the culture results -Continue supportive care  Acute pyelonephritis Patient has a positive urinalysis, typical symptoms for  urinary infection, with left flank pain, indicating acute pyelonephritis.  CT renal stone study was without any acute abnormality. Did show multifocal patchy opacities in the right lower lobe suspicious for pneumonia. -Pending urine cultures -Continue with current antibiotics  COPD (chronic obstructive pulmonary disease) (HCC) No wheezing. - Continue with as needed bronchodilators and supportive care  Chronic diastolic CHF (congestive heart failure) (HCC) 2D echo 02/01/2022 showed EF > 55% with grade 1 diastolic dysfunction, patient has mild fluid overload as discussed above. BNP elevated at 500 with trace lower extremity edema. - Home spironolactone  and torsemide  as was held due to AKI which started improving now. - Keep holding diuretics for now -Monitor volume status closely  Hypothyroidism - Continue home Synthroid   Hypertension Blood pressure within lower goal. - Keep holding home Zestoretic, torsemide  and spironolactone  -Continuing metoprolol  -As needed hydralazine  HLD (hyperlipidemia) - Continue the home Lipitor  Acute renal failure superimposed on stage 3b chronic kidney disease (HCC) Likely secondary to concern of sepsis and continuation of diuretics. Some improvement of creatinine to 2.88, baseline of 2.23 - Monitor renal function -Keep holding home diuretics and Zestoretic -Avoid nephrotoxins  Anemia in chronic kidney disease (CKD) Hemoglobin seems stable at 10.6 today which is around her baseline. - Monitor hemoglobin -Transfuse if below 7  Hyperkalemia Mild hyperkalemia in the beginning likely due to hemolyzed sample as it was resolved on subsequent check  Overweight (BMI 25.0-29.9) Body weight 66.7 kg, BMI 26.90 - Encourage losing weight - Exercise healthy diet   Subjective: Patient was feeling very weak and little short of breath when seen today.  Continued to have significant cough.  Physical Exam: Vitals:   06/22/23 0930 06/22/23 1030 06/22/23  1100 06/22/23 1319  BP: Aaron Aas)  107/56 (!) 96/59 (!) 106/51   Pulse: (!) 102 (!) 53 96   Resp: (!) 22 (!) 22 (!) 21   Temp:    97.9 F (36.6 C)  TempSrc:    Oral  SpO2: 99% 94% 97%   Weight:      Height:       General.  Frail elderly lady, in no acute distress. Pulmonary.  Some coarse breath sounds with scattered rhonchi, mildly increased work of breathing CV.  Regular rate and rhythm, no JVD, rub or murmur. Abdomen.  Soft, nontender, nondistended, BS positive. CNS.  Alert and oriented .  No focal neurologic deficit. Extremities.  Trace LE edema,  pulses intact and symmetrical.  Data Reviewed: Prior data reviewed  Family Communication: Discussed with daughter at bedside  Disposition: Status is: Inpatient Remains inpatient appropriate because: Severity of illness  Planned Discharge Destination: Home with Home Health  DVT prophylaxis.  Subcu heparin Time spent: 50 minutes  This record has been created using Conservation officer, historic buildings. Errors have been sought and corrected,but may not always be located. Such creation errors do not reflect on the standard of care.   Author: Luna Salinas, MD 06/22/2023 2:26 PM  For on call review [email or web address].

## 2023-06-22 NOTE — Assessment & Plan Note (Signed)
 2D echo 02/01/2022 showed EF > 55% with grade 1 diastolic dysfunction, patient has mild fluid overload as discussed above. BNP elevated at 500 with trace lower extremity edema. - Home spironolactone  and torsemide  as was held due to AKI which started improving now. - Keep holding diuretics for now -Monitor volume status closely

## 2023-06-22 NOTE — Assessment & Plan Note (Signed)
-   Continue the home Lipitor

## 2023-06-22 NOTE — Assessment & Plan Note (Signed)
 Patient has a positive urinalysis, typical symptoms for urinary infection, with left flank pain, indicating acute pyelonephritis.  CT renal stone study was without any acute abnormality. Did show multifocal patchy opacities in the right lower lobe suspicious for pneumonia. -Pending urine cultures -Continue with current antibiotics

## 2023-06-22 NOTE — Assessment & Plan Note (Signed)
 Likely secondary to concern of sepsis and continuation of diuretics. Some improvement of creatinine to 2.88, baseline of 2.23 - Monitor renal function -Keep holding home diuretics and Zestoretic -Avoid nephrotoxins

## 2023-06-22 NOTE — Assessment & Plan Note (Signed)
 Continue home Synthroid

## 2023-06-22 NOTE — Hospital Course (Addendum)
 Taken from H&P.  Eileen Wright is a 83 y.o. female with medical history significant of COPD, HTN, HLD, dCHF, hypothyroidism, CKD 3B, anemia, former smoker, who presents with cough with greenish sputum production and SOB for 3 days. Patient also reports dysuria and burning micturition with left flank pain.  On presentation patient had mild tachycardia and tachypnea, labs with leukocytosis at 20.9, BNP 500, lactic acid 1.1, CO2 of 12, potassium 5.2, AKI with creatinine at 3.37, BUN 105, recent baseline creatinine of 2.23 on 05/24/2023. Chest x-ray with bilateral lower lobe opacity and small bilateral pleural effusions.  Patient received IV fluid, started on Rocephin and doxycycline for pneumonia.  Cultures were sent.  6/19: Vital stable, on room air, labs with worsening leukocytosis at 24, hemoglobin at 10.6-all cell line increased.  Hyperkalemia resolved with potassium at 4.5, CO2 of 15, BUN 98 and some improvement of creatinine to 2.88.  Procalcitonin significantly elevated at 53.47, strep pneumo negative. Urine, sputum and blood cultures pending.  Broadening antibiotics to cefepime while waiting for cultures.  6/20: Vital stable, preliminary blood cultures growing gram-negative rods identified as Enrerobacterales-pending susceptibility but patient is on cefepime which should be able to cover.  Urine cultures with multiple species, strep pneumo negative, urine cultures with abundant gram-positive cocci, gram-positive rods and gram-negative rods-pending final results.  Procalcitonin improving, now at 16.03, some improvement of leukocytosis to 21.2, bicarb at 16 and slowly improving renal function with creatinine at 2.22.  Adding some bicarb tablet.

## 2023-06-22 NOTE — Assessment & Plan Note (Signed)
 No wheezing. - Continue with as needed bronchodilators and supportive care

## 2023-06-22 NOTE — Assessment & Plan Note (Signed)
 Body weight 66.7 kg, BMI 26.90 - Encourage losing weight - Exercise healthy diet

## 2023-06-22 NOTE — Progress Notes (Signed)
 PHARMACY - PHYSICIAN COMMUNICATION CRITICAL VALUE ALERT - BLOOD CULTURE IDENTIFICATION (BCID)  Eileen Wright is an 83 y.o. female who presented to [facility] on 06/21/2023 with a chief complaint of shortness of breath for 3 days and increased sputum.  Assessment:  Sepsis due to pneumonia and acute pyelonephritis (include suspected source if known)  Name of physician (or Provider) Contacted: DR Ariel Begun  Current antibiotics: doxycycline and cefepime  Changes to prescribed antibiotics recommended:  NO change at this time.   Results for orders placed or performed during the hospital encounter of 06/21/23  Blood Culture ID Panel (Reflexed) (Collected: 06/21/2023  7:55 PM)  Result Value Ref Range   Enterococcus faecalis NOT DETECTED NOT DETECTED   Enterococcus Faecium NOT DETECTED NOT DETECTED   Listeria monocytogenes NOT DETECTED NOT DETECTED   Staphylococcus species NOT DETECTED NOT DETECTED   Staphylococcus aureus (BCID) NOT DETECTED NOT DETECTED   Staphylococcus epidermidis NOT DETECTED NOT DETECTED   Staphylococcus lugdunensis NOT DETECTED NOT DETECTED   Streptococcus species NOT DETECTED NOT DETECTED   Streptococcus agalactiae NOT DETECTED NOT DETECTED   Streptococcus pneumoniae NOT DETECTED NOT DETECTED   Streptococcus pyogenes NOT DETECTED NOT DETECTED   A.calcoaceticus-baumannii NOT DETECTED NOT DETECTED   Bacteroides fragilis NOT DETECTED NOT DETECTED   Enterobacterales DETECTED (A) NOT DETECTED   Enterobacter cloacae complex NOT DETECTED NOT DETECTED   Escherichia coli NOT DETECTED NOT DETECTED   Klebsiella aerogenes NOT DETECTED NOT DETECTED   Klebsiella oxytoca NOT DETECTED NOT DETECTED   Klebsiella pneumoniae NOT DETECTED NOT DETECTED   Proteus species NOT DETECTED NOT DETECTED   Salmonella species NOT DETECTED NOT DETECTED   Serratia marcescens NOT DETECTED NOT DETECTED   Haemophilus influenzae NOT DETECTED NOT DETECTED   Neisseria meningitidis NOT DETECTED NOT DETECTED    Pseudomonas aeruginosa NOT DETECTED NOT DETECTED   Stenotrophomonas maltophilia NOT DETECTED NOT DETECTED   Candida albicans NOT DETECTED NOT DETECTED   Candida auris NOT DETECTED NOT DETECTED   Candida glabrata NOT DETECTED NOT DETECTED   Candida krusei NOT DETECTED NOT DETECTED   Candida parapsilosis NOT DETECTED NOT DETECTED   Candida tropicalis NOT DETECTED NOT DETECTED   Cryptococcus neoformans/gattii NOT DETECTED NOT DETECTED   CTX-M ESBL NOT DETECTED NOT DETECTED   Carbapenem resistance IMP NOT DETECTED NOT DETECTED   Carbapenem resistance KPC NOT DETECTED NOT DETECTED   Carbapenem resistance NDM NOT DETECTED NOT DETECTED   Carbapenem resist OXA 48 LIKE NOT DETECTED NOT DETECTED   Carbapenem resistance VIM NOT DETECTED NOT DETECTED   [place] Noori Rodriguez-Guzman PharmD, BCPS 06/22/2023 6:40 PM

## 2023-06-22 NOTE — Assessment & Plan Note (Signed)
 Mild hyperkalemia in the beginning likely due to hemolyzed sample as it was resolved on subsequent check

## 2023-06-22 NOTE — Assessment & Plan Note (Addendum)
 Patient met sepsis criteria with leukocytosis, tachycardia and tachypnea.  Mild AKI, no lactic acidosis.  Likely secondary to pneumonia.  UA with concern of UTI.  Blood, urine and sputum cultures pending.  Significantly elevated procalcitonin with worsening leukocytosis today.  Strep pneumo negative - Broadening of antibiotics to cefepime from ceftriaxone -Continue with doxycycline -Follow-up culture results-we will de-escalate according to the culture results -Continue supportive care

## 2023-06-22 NOTE — Plan of Care (Signed)
  Problem: Education: Goal: Knowledge of General Education information will improve Description: Including pain rating scale, medication(s)/side effects and non-pharmacologic comfort measures Outcome: Progressing   Problem: Respiratory: Goal: Ability to maintain adequate ventilation will improve Outcome: Progressing Goal: Ability to maintain a clear airway will improve Outcome: Progressing

## 2023-06-22 NOTE — Assessment & Plan Note (Signed)
 Blood pressure within lower goal. - Keep holding home Zestoretic, torsemide  and spironolactone  -Continuing metoprolol  -As needed hydralazine

## 2023-06-22 NOTE — ED Notes (Signed)
Informed RN bed assigned 

## 2023-06-23 DIAGNOSIS — N10 Acute tubulo-interstitial nephritis: Secondary | ICD-10-CM | POA: Diagnosis not present

## 2023-06-23 DIAGNOSIS — J449 Chronic obstructive pulmonary disease, unspecified: Secondary | ICD-10-CM | POA: Diagnosis not present

## 2023-06-23 DIAGNOSIS — E872 Acidosis, unspecified: Secondary | ICD-10-CM

## 2023-06-23 DIAGNOSIS — J189 Pneumonia, unspecified organism: Secondary | ICD-10-CM | POA: Diagnosis not present

## 2023-06-23 DIAGNOSIS — N179 Acute kidney failure, unspecified: Secondary | ICD-10-CM | POA: Diagnosis not present

## 2023-06-23 LAB — BASIC METABOLIC PANEL WITH GFR
Anion gap: 7 (ref 5–15)
BUN: 76 mg/dL — ABNORMAL HIGH (ref 8–23)
CO2: 16 mmol/L — ABNORMAL LOW (ref 22–32)
Calcium: 9.4 mg/dL (ref 8.9–10.3)
Chloride: 116 mmol/L — ABNORMAL HIGH (ref 98–111)
Creatinine, Ser: 2.22 mg/dL — ABNORMAL HIGH (ref 0.44–1.00)
GFR, Estimated: 22 mL/min — ABNORMAL LOW (ref >60)
Glucose, Bld: 114 mg/dL — ABNORMAL HIGH (ref 70–99)
Potassium: 3.8 mmol/L (ref 3.5–5.1)
Sodium: 139 mmol/L (ref 135–145)

## 2023-06-23 LAB — CBC
HCT: 28.8 % — ABNORMAL LOW (ref 36.0–46.0)
Hemoglobin: 9.4 g/dL — ABNORMAL LOW (ref 12.0–15.0)
MCH: 30.1 pg (ref 26.0–34.0)
MCHC: 32.6 g/dL (ref 30.0–36.0)
MCV: 92.3 fL (ref 80.0–100.0)
Platelets: 527 10*3/uL — ABNORMAL HIGH (ref 150–400)
RBC: 3.12 MIL/uL — ABNORMAL LOW (ref 3.87–5.11)
RDW: 15.1 % (ref 11.5–15.5)
WBC: 21.2 10*3/uL — ABNORMAL HIGH (ref 4.0–10.5)
nRBC: 0 % (ref 0.0–0.2)

## 2023-06-23 LAB — URINE CULTURE

## 2023-06-23 LAB — PROCALCITONIN: Procalcitonin: 16.03 ng/mL

## 2023-06-23 LAB — CULTURE, BLOOD (ROUTINE X 2)

## 2023-06-23 MED ORDER — SODIUM BICARBONATE 650 MG PO TABS
1300.0000 mg | ORAL_TABLET | Freq: Two times a day (BID) | ORAL | Status: DC
  Administered 2023-06-23 – 2023-07-04 (×23): 1300 mg via ORAL
  Filled 2023-06-23 (×24): qty 2

## 2023-06-23 NOTE — TOC Initial Note (Signed)
 Transition of Care [facility]) - Initial/Assessment Note    Patient Details  Name: Eileen Wright MRN: [id number] Date of Birth: 1940/11/19  [facility]) CM/SW Contact:    Alexandra Ice, RN Phone Number: 06/23/2023, 3:34 PM  Clinical Narrative:                  Met with patient at bedside. TOC explained role and reason for visit. She stated she had home health in the past. She would like to discuss with daughter first before agreeing to services. She has had [facility] in the past. TOC will continue to follow  Expected Discharge Plan: Home w Home Health Services Barriers to Discharge: Continued Medical Work up   Patient Goals and CMS Choice Patient states their goals for this hospitalization and ongoing recovery are:: get better to go home CMS Medicare.gov Compare Post Acute Care list provided to:: Patient Choice offered to / list presented to : Patient      Expected Discharge Plan and Services     Post Acute Care Choice: Home Health Living arrangements for the past 2 months: Single Family Home                   DME Agency: NA                  Prior Living Arrangements/Services Living arrangements for the past 2 months: Single Family Home Lives with:: Adult Children Patient language and need for interpreter reviewed:: Yes Do you feel safe going back to the place where you live?: Yes      Need for Family Participation in Patient Care: No (Comment) Care giver support system in place?: Yes (comment) Current home services: DME (Rolling Walker (2 wheels);Rollator (4 wheels);Cane - single point;BSC/3in1;Grab bars - toilet;Grab bars - tub/shower) Criminal Activity/Legal Involvement Pertinent to Current Situation/Hospitalization: No - Comment as needed  Activities of Daily Living   ADL Screening (condition at time of admission) Independently performs ADLs?: Yes (appropriate for developmental age) Is the patient deaf or have difficulty hearing?: No Does the  patient have difficulty seeing, even when wearing glasses/contacts?: No Does the patient have difficulty concentrating, remembering, or making decisions?: No  Permission Sought/Granted                  Emotional Assessment Appearance:: Appears stated age Attitude/Demeanor/Rapport: Engaged Affect (typically observed): Accepting, Stable, Appropriate Orientation: : Oriented to Situation, Oriented to  Time, Oriented to Place, Oriented to Self Alcohol  / Substance Use: Not Applicable Psych Involvement: No (comment)  Admission diagnosis:  CAP (community acquired pneumonia) [J18.9] AKI (acute kidney injury) (HCC) [N17.9] Pneumonia due to infectious organism, unspecified laterality, unspecified part of lung [J18.9] Patient Active Problem List   Diagnosis Date Noted   Metabolic acidosis 06/23/2023   Acute pyelonephritis 06/22/2023   Sepsis due to pneumonia (HCC) 06/21/2023   Acute renal failure superimposed on stage 3b chronic kidney disease (HCC) 06/21/2023   HLD (hyperlipidemia) 06/21/2023   Chronic diastolic CHF (congestive heart failure) (HCC) 06/21/2023   Anemia in chronic kidney disease (CKD) 06/21/2023   Overweight (BMI 25.0-29.9) 06/21/2023   Hyperkalemia 12/05/2020   Acute blood loss anemia 12/05/2020   Hypothyroidism    COPD (chronic obstructive pulmonary disease) (HCC)    Hypertension    GERD (gastroesophageal reflux disease)    Stage 3b chronic kidney disease (CKD) (HCC)    Osteoarthritis of left hip 12/03/2020   Primary osteoarthritis of left hip 12/03/2020   PCP:  Rochelle Chu,  Tomas Fountain, MD Pharmacy:   [facility] [phone] [place], [place] - [address] [place] [address] Phone: [phone] Fax: [phone]     Social Drivers of Health (SDOH) Social History: SDOH Screenings   Food Insecurity: No Food Insecurity (06/22/2023)  Housing: Low Risk  (06/22/2023)  Transportation Needs: No Transportation Needs (06/22/2023)  Utilities: Not At Risk  (06/22/2023)  Social Connections: Patient Declined (06/22/2023)  Tobacco Use: Medium Risk (06/21/2023)   SDOH Interventions:     Readmission Risk Interventions     No data to display

## 2023-06-23 NOTE — Assessment & Plan Note (Signed)
 Patient met sepsis criteria with leukocytosis, tachycardia and tachypnea.  Mild AKI, no lactic acidosis.  Likely secondary to pneumonia.  UA with concern of UTI.   Strep pneumo negative.  Preliminary blood cultures with Enterobacterales-pending susceptibility.  Urine cultures with multiple species, sputum cultures with multiple organisms like cocci and rods growing but no identification yet.  Improving procalcitonin - Continue with cefepime-we will de-escalate as culture results are available.-Will prefer to have 72 hours of IV antibiotics followed by p.o. for concern of bacteremia -Continue with doxycycline -Follow-up culture results-we will de-escalate according to the culture results -Continue supportive care

## 2023-06-23 NOTE — Evaluation (Signed)
 Physical Therapy Evaluation Patient Details Name: Eileen Wright MRN: [id number] DOB: 06-Oct-1940 Today's Date: 06/23/2023  History of Present Illness  Pt is an 83 yo female that presented the ED for SOB, sputum.  workup for pneumonia and acute pyelonephritis, potential UTI. PMH of COPD, HTN, HLD, dCHF, hypothyroidism, CKD 3B, anemia, former smoker.  Clinical Impression  Patient alert, agreeable to mobility, reported some improvement in weakness and fatigue. Per pt at baseline she lives with her daughter (who works during the day) ambulatory with RW outside of home, and recent use after hip surgery. Session interrupted several times (MD, RN, lab) time accounted for as appropriate.   She was able to perform bed mobility with minA, anticipate no true assistance needed but pt requested despite encouragement to complete without assistance. Sit <> stand with RW and CGA, able to transfer to recliner with CGA. After a seated rest break she ambulated ~46ft, twice. Pt did fatigue quickly, HR in 120s, spO2 on room air 90-92%. Pt up in chair with needs in reach.  Overall the patient demonstrated deficits (see PT Problem List) that impede the patient's functional abilities, safety, and mobility and would benefit from skilled PT intervention.         If plan is discharge home, recommend the following: A little help with walking and/or transfers;A little help with bathing/dressing/bathroom;Assistance with cooking/housework;Assist for transportation;Help with stairs or ramp for entrance   Can travel by private vehicle        Equipment Recommendations None recommended by PT  Recommendations for Other Services       Functional Status Assessment Patient has had a recent decline in their functional status and demonstrates the ability to make significant improvements in function in a reasonable and predictable amount of time.     Precautions / Restrictions Precautions Precautions: Fall Recall of  Precautions/Restrictions: Intact Restrictions Weight Bearing Restrictions Per Provider Order: No      Mobility  Bed Mobility Overal bed mobility: Needs Assistance Bed Mobility: Supine to Sit     Supine to sit: Min assist     General bed mobility comments: pt able to initiate and perform, requested minA for fatigue    Transfers Overall transfer level: Needs assistance Equipment used: Rolling walker (2 wheels) Transfers: Sit to/from Stand Sit to Stand: Contact guard assist                Ambulation/Gait Ambulation/Gait assistance: Contact guard assist Gait Distance (Feet):  (32ft, 59ft) Assistive device: Rolling walker (2 wheels)         General Gait Details: fatigued very quickly. SOB. spO2 90%, HR in 120s with mobility  Stairs            Wheelchair Mobility     Tilt Bed    Modified Rankin (Stroke Patients Only)       Balance Overall balance assessment: Needs assistance Sitting-balance support: Feet supported Sitting balance-Leahy Scale: Good     Standing balance support: Bilateral upper extremity supported, During functional activity Standing balance-Leahy Scale: Fair                               Pertinent Vitals/Pain      Home Living Family/patient expects to be discharged to:: Private residence Living Arrangements: Children (daughter, works during the day) Available Help at Discharge: Family Type of Home: House Home Access: Stairs to enter Entrance Stairs-Rails: Right Entrance Stairs-Number of Steps: 3   Home Layout:  One level Home Equipment: Agricultural consultant (2 wheels);Rollator (4 wheels);Cane - single point;BSC/3in1;Grab bars - toilet;Grab bars - tub/shower      Prior Function Prior Level of Function : Independent/Modified Independent             Mobility Comments: RW for ambulation at home recently ADLs Comments: ADLs for bathing/dressing     Extremity/Trunk Assessment   Upper Extremity Assessment Upper  Extremity Assessment: Generalized weakness    Lower Extremity Assessment Lower Extremity Assessment: Generalized weakness       Communication   Communication Communication: No apparent difficulties    Cognition Arousal: Alert Behavior During Therapy: WFL for tasks assessed/performed   PT - Cognitive impairments: No apparent impairments                         Following commands: Intact       Cueing       General Comments      Exercises     Assessment/Plan    PT Assessment Patient needs continued PT services  PT Problem List Decreased strength;Decreased range of motion;Decreased activity tolerance;Decreased balance;Decreased mobility       PT Treatment Interventions DME instruction;Balance training;Neuromuscular re-education;Gait training;Stair training;Functional mobility training;Patient/family education;Therapeutic activities;Therapeutic exercise    PT Goals (Current goals can be found in the Care Plan section)  Acute Rehab PT Goals Patient Stated Goal: to feel better PT Goal Formulation: With patient Time For Goal Achievement: 07/07/23 Potential to Achieve Goals: Good    Frequency Min 2X/week     Co-evaluation               AM-PAC PT 6 Clicks Mobility  Outcome Measure Help needed turning from your back to your side while in a flat bed without using bedrails?: A Little Help needed moving from lying on your back to sitting on the side of a flat bed without using bedrails?: A Little Help needed moving to and from a bed to a chair (including a wheelchair)?: A Little Help needed standing up from a chair using your arms (e.g., wheelchair or bedside chair)?: A Little Help needed to walk in hospital room?: A Little Help needed climbing 3-5 steps with a railing? : A Lot 6 Click Score: 17    End of Session Equipment Utilized During Treatment: Gait belt Activity Tolerance: Patient limited by fatigue Patient left: in chair;with call  bell/phone within reach;with chair alarm set Nurse Communication: Mobility status PT Visit Diagnosis: Other abnormalities of gait and mobility (R26.89);Difficulty in walking, not elsewhere classified (R26.2);Muscle weakness (generalized) (M62.81)    Time: 1610-9604 PT Time Calculation (min) (ACUTE ONLY): 39 min   Charges:   PT Evaluation $PT Eval Low Complexity: 1 Low PT Treatments $Therapeutic Activity: 23-37 mins PT General Charges $$ ACUTE PT VISIT: 1 Visit        Darien Eden PT, DPT 1:48 PM,06/23/23

## 2023-06-23 NOTE — Assessment & Plan Note (Signed)
 Patient has a positive urinalysis, typical symptoms for urinary infection, with left flank pain, indicating acute pyelonephritis.  CT renal stone study was without any acute abnormality. Did show multifocal patchy opacities in the right lower lobe suspicious for pneumonia. -Urine cultures with multiple species -Continue with current antibiotics

## 2023-06-23 NOTE — Assessment & Plan Note (Signed)
 2D echo 02/01/2022 showed EF > 55% with grade 1 diastolic dysfunction, patient has mild fluid overload as discussed above. BNP elevated at 500 with trace lower extremity edema. - Home spironolactone  and torsemide  as was held due to AKI which started improving now. - Keep holding diuretics for now -Monitor volume status closely

## 2023-06-23 NOTE — Assessment & Plan Note (Signed)
 Seems multifactorial with CKD and recent sepsis. - Started on bicarb tablet

## 2023-06-23 NOTE — Assessment & Plan Note (Signed)
 Hemoglobin seems stable - Monitor hemoglobin -Transfuse if below 7

## 2023-06-23 NOTE — Progress Notes (Signed)
 Progress Note   Patient: Eileen Wright:[id number] DOB: 1940/05/03 DOA: 06/21/2023     1 DOS: the patient was seen and examined on 06/23/2023   Brief hospital course: Taken from H&P.  Eileen Wright is a 83 y.o. female with medical history significant of COPD, HTN, HLD, dCHF, hypothyroidism, CKD 3B, anemia, former smoker, who presents with cough with greenish sputum production and SOB for 3 days. Patient also reports dysuria and burning micturition with left flank pain.  On presentation patient had mild tachycardia and tachypnea, labs with leukocytosis at 20.9, BNP 500, lactic acid 1.1, CO2 of 12, potassium 5.2, AKI with creatinine at 3.37, BUN 105, recent baseline creatinine of 2.23 on 05/24/2023. Chest x-ray with bilateral lower lobe opacity and small bilateral pleural effusions.  Patient received IV fluid, started on Rocephin and doxycycline for pneumonia.  Cultures were sent.  6/19: Vital stable, on room air, labs with worsening leukocytosis at 24, hemoglobin at 10.6-all cell line increased.  Hyperkalemia resolved with potassium at 4.5, CO2 of 15, BUN 98 and some improvement of creatinine to 2.88.  Procalcitonin significantly elevated at 53.47, strep pneumo negative. Urine, sputum and blood cultures pending.  Broadening antibiotics to cefepime while waiting for cultures.  6/20: Vital stable, preliminary blood cultures growing gram-negative rods identified as Enrerobacterales-pending susceptibility but patient is on cefepime which should be able to cover.  Urine cultures with multiple species, strep pneumo negative, urine cultures with abundant gram-positive cocci, gram-positive rods and gram-negative rods-pending final results.  Procalcitonin improving, now at 16.03, some improvement of leukocytosis to 21.2, bicarb at 16 and slowly improving renal function with creatinine at 2.22.  Adding some bicarb tablet.  Assessment and Plan: * Sepsis due to pneumonia [facility]) Patient met sepsis  criteria with leukocytosis, tachycardia and tachypnea.  Mild AKI, no lactic acidosis.  Likely secondary to pneumonia.  UA with concern of UTI.   Strep pneumo negative.  Preliminary blood cultures with Enterobacterales-pending susceptibility.  Urine cultures with multiple species, sputum cultures with multiple organisms like cocci and rods growing but no identification yet.  Improving procalcitonin - Continue with cefepime-we will de-escalate as culture results are available.-Will prefer to have 72 hours of IV antibiotics followed by p.o. for concern of bacteremia -Continue with doxycycline -Follow-up culture results-we will de-escalate according to the culture results -Continue supportive care  Acute pyelonephritis Patient has a positive urinalysis, typical symptoms for urinary infection, with left flank pain, indicating acute pyelonephritis.  CT renal stone study was without any acute abnormality. Did show multifocal patchy opacities in the right lower lobe suspicious for pneumonia. -Urine cultures with multiple species -Continue with current antibiotics  COPD (chronic obstructive pulmonary disease) (HCC) No wheezing. - Continue with as needed bronchodilators and supportive care  Chronic diastolic CHF (congestive heart failure) (HCC) 2D echo 02/01/2022 showed EF > 55% with grade 1 diastolic dysfunction, patient has mild fluid overload as discussed above. BNP elevated at 500 with trace lower extremity edema. - Home spironolactone  and torsemide  as was held due to AKI which started improving now. - Keep holding diuretics for now -Monitor volume status closely  Hypothyroidism - Continue home Synthroid   Metabolic acidosis Seems multifactorial with CKD and recent sepsis. - Started on bicarb tablet  Hypertension Blood pressure within lower goal. - Keep holding home Zestoretic, torsemide  and spironolactone  -Continuing metoprolol  -As needed hydralazine  HLD (hyperlipidemia) - Continue  the home Lipitor  Acute renal failure superimposed on stage 3b chronic kidney disease (HCC) Likely secondary to concern of  sepsis and continuation of diuretics. Some improvement of creatinine to 2.22, baseline of 2.23 - Monitor renal function -Keep holding home diuretics and Zestoretic -Avoid nephrotoxins  Anemia in chronic kidney disease (CKD) Hemoglobin seems stable - Monitor hemoglobin -Transfuse if below 7  Hyperkalemia Mild hyperkalemia in the beginning likely due to hemolyzed sample as it was resolved on subsequent check - Continue to monitor  Overweight (BMI 25.0-29.9) Body weight 66.7 kg, BMI 26.90 - Encourage losing weight - Exercise healthy diet   Subjective: Patient still feeling weak and little short of breath.  Continue to have some cough.  Physical Exam: Vitals:   06/22/23 1628 06/22/23 2033 06/23/23 0432 06/23/23 0750  BP: 116/60 118/62 121/64 (!) 106/55  Pulse: 98 88 92 98  Resp: 19 17 16 16   Temp: (!) 97.5 F (36.4 C) 97.8 F (36.6 C) 97.9 F (36.6 C) 97.7 F (36.5 C)  TempSrc: Oral Oral Oral Oral  SpO2: 97% 99% 98% 100%  Weight:      Height:       General.  Frail elderly lady, in no acute distress. Pulmonary.  Few scattered rhonchi bilaterally, normal respiratory effort. CV.  Regular rate and rhythm, no JVD, rub or murmur. Abdomen.  Soft, nontender, nondistended, BS positive. CNS.  Alert and oriented .  No focal neurologic deficit. Extremities.  No edema, no cyanosis, pulses intact and symmetrical.  Data Reviewed: Prior data reviewed  Family Communication: Talked with daughter on phone.  Disposition: Status is: Inpatient Remains inpatient appropriate because: Severity of illness  Planned Discharge Destination: Home with Home Health  DVT prophylaxis.  Subcu heparin Time spent: 50 minutes  This record has been created using Conservation officer, historic buildings. Errors have been sought and corrected,but may not always be located. Such creation  errors do not reflect on the standard of care.   Author: Luna Salinas, MD 06/23/2023 2:28 PM  For on call review [email or web address].

## 2023-06-23 NOTE — Assessment & Plan Note (Signed)
 Mild hyperkalemia in the beginning likely due to hemolyzed sample as it was resolved on subsequent check - Continue to monitor

## 2023-06-23 NOTE — Assessment & Plan Note (Signed)
 No wheezing. - Continue with as needed bronchodilators and supportive care

## 2023-06-23 NOTE — Evaluation (Signed)
 Occupational Therapy Evaluation Patient Details Name: EVELYN AGUINALDO MRN: [id number] DOB: April 25, 1940 Today's Date: 06/23/2023   History of Present Illness   Pt is an 83 yo female that presented the ED for SOB, sputum.  workup for pneumonia and acute pyelonephritis, potential UTI. PMH of COPD, HTN, HLD, dCHF, hypothyroidism, CKD 3B, anemia, former smoker.    Clinical Impressions Ms Noyes was seen for OT evaluation this date. Prior to hospital admission, pt was MOD I using RW. Pt lives with daughter who works during the day, assist for bathing and meals. Pt currently requires SUPERVISION for bed mobility including sup<>long sitting x2 trials and sup<>sit, use of leg lifter to return to bed. MIN A + HHA sit<>stand at EOB, defers further mobility citing fatigue and request to finish lunch. Educated on IS and HEP. Pt would benefit from skilled OT to address noted impairments and functional limitations (see below for any additional details). Upon hospital discharge, recommend OT follow up.    If plan is discharge home, recommend the following:   A little help with walking and/or transfers;A little help with bathing/dressing/bathroom;Help with stairs or ramp for entrance     Functional Status Assessment   Patient has had a recent decline in their functional status and demonstrates the ability to make significant improvements in function in a reasonable and predictable amount of time.     Equipment Recommendations   BSC/3in1     Recommendations for Other Services         Precautions/Restrictions   Precautions Precautions: Fall Recall of Precautions/Restrictions: Intact Restrictions Weight Bearing Restrictions Per Provider Order: No     Mobility Bed Mobility Overal bed mobility: Needs Assistance Bed Mobility: Sit to Supine, Supine to Sit     Supine to sit: Contact guard Sit to supine: Contact guard assist   General bed mobility comments: use of leg lifter to return to  bed    Transfers Overall transfer level: Needs assistance Equipment used: 1 person hand held assist Transfers: Sit to/from Stand Sit to Stand: Min assist                  Balance Overall balance assessment: Needs assistance Sitting-balance support: Feet supported Sitting balance-Leahy Scale: Good     Standing balance support: During functional activity, Single extremity supported Standing balance-Leahy Scale: Fair                             ADL either performed or assessed with clinical judgement   ADL Overall ADL's : Needs assistance/impaired                                       General ADL Comments: MIN A for LB access in sitting. MIN A + HHA standing grooming task     Vision         Perception         Praxis         Pertinent Vitals/Pain       Extremity/Trunk Assessment Upper Extremity Assessment Upper Extremity Assessment: Overall WFL for tasks assessed   Lower Extremity Assessment Lower Extremity Assessment: Generalized weakness       Communication Communication Communication: Impaired Factors Affecting Communication: Hearing impaired   Cognition Arousal: Alert Behavior During Therapy: WFL for tasks assessed/performed Cognition: No apparent impairments  Following commands: Intact       Cueing  General Comments          Exercises     Shoulder Instructions      Home Living Family/patient expects to be discharged to:: Private residence Living Arrangements: Children (daughter, works during the day) Available Help at Discharge: Family Type of Home: House Home Access: Stairs to enter Secretary/administrator of Steps: 3 Entrance Stairs-Rails: Right Home Layout: One level     Bathroom Shower/Tub: Producer, television/film/video: Handicapped height Bathroom Accessibility: Yes   Home Equipment: Agricultural consultant (2 wheels);Rollator (4 wheels);Cane - single  point;BSC/3in1;Grab bars - toilet;Grab bars - tub/shower          Prior Functioning/Environment Prior Level of Function : Independent/Modified Independent             Mobility Comments: RW for ambulation at home recently ADLs Comments: assist for bathing/dressing    OT Problem List: Decreased strength;Decreased activity tolerance   OT Treatment/Interventions: Self-care/ADL training;Therapeutic exercise;Energy conservation;DME and/or AE instruction;Therapeutic activities;Patient/family education      OT Goals(Current goals can be found in the care plan section)   Acute Rehab OT Goals Patient Stated Goal: to go home OT Goal Formulation: With patient Time For Goal Achievement: 07/07/23 Potential to Achieve Goals: Good ADL Goals Pt Will Perform Grooming: with modified independence;standing Pt Will Perform Lower Body Dressing: with modified independence;sit to/from stand Pt Will Transfer to Toilet: with modified independence;ambulating;regular height toilet   OT Frequency:  Min 3X/week    Co-evaluation              AM-PAC OT 6 Clicks Daily Activity     Outcome Measure Help from another person eating meals?: None Help from another person taking care of personal grooming?: A Little Help from another person toileting, which includes using toliet, bedpan, or urinal?: A Little Help from another person bathing (including washing, rinsing, drying)?: A Little Help from another person to put on and taking off regular upper body clothing?: A Little Help from another person to put on and taking off regular lower body clothing?: A Lot 6 Click Score: 18   End of Session    Activity Tolerance: Patient tolerated treatment well Patient left: in bed;with call bell/phone within reach;with bed alarm set  OT Visit Diagnosis: Other abnormalities of gait and mobility (R26.89);Muscle weakness (generalized) (M62.81)                Time: 2831-5176 OT Time Calculation (min): 17  min Charges:  OT General Charges $OT Visit: 1 Visit OT Evaluation $OT Eval Moderate Complexity: 1 Mod  Gordan Latina, M.S. OTR/L  06/23/23, 2:04 PM  ascom [phone]

## 2023-06-23 NOTE — Assessment & Plan Note (Signed)
 Blood pressure within lower goal. - Keep holding home Zestoretic, torsemide  and spironolactone  -Continuing metoprolol  -As needed hydralazine

## 2023-06-23 NOTE — Assessment & Plan Note (Signed)
 Likely secondary to concern of sepsis and continuation of diuretics. Some improvement of creatinine to 2.22, baseline of 2.23 - Monitor renal function -Keep holding home diuretics and Zestoretic -Avoid nephrotoxins

## 2023-06-24 DIAGNOSIS — J189 Pneumonia, unspecified organism: Secondary | ICD-10-CM | POA: Diagnosis not present

## 2023-06-24 DIAGNOSIS — A419 Sepsis, unspecified organism: Secondary | ICD-10-CM | POA: Diagnosis not present

## 2023-06-24 LAB — CBC
HCT: 27.3 % — ABNORMAL LOW (ref 36.0–46.0)
Hemoglobin: 9 g/dL — ABNORMAL LOW (ref 12.0–15.0)
MCH: 30.1 pg (ref 26.0–34.0)
MCHC: 33 g/dL (ref 30.0–36.0)
MCV: 91.3 fL (ref 80.0–100.0)
Platelets: 522 10*3/uL — ABNORMAL HIGH (ref 150–400)
RBC: 2.99 MIL/uL — ABNORMAL LOW (ref 3.87–5.11)
RDW: 15.1 % (ref 11.5–15.5)
WBC: 19.4 10*3/uL — ABNORMAL HIGH (ref 4.0–10.5)
nRBC: 0 % (ref 0.0–0.2)

## 2023-06-24 LAB — LEGIONELLA PNEUMOPHILA SEROGP 1 UR AG: L. pneumophila Serogp 1 Ur Ag: NEGATIVE

## 2023-06-24 LAB — BASIC METABOLIC PANEL WITH GFR
Anion gap: 5 (ref 5–15)
BUN: 64 mg/dL — ABNORMAL HIGH (ref 8–23)
CO2: 18 mmol/L — ABNORMAL LOW (ref 22–32)
Calcium: 9.9 mg/dL (ref 8.9–10.3)
Chloride: 116 mmol/L — ABNORMAL HIGH (ref 98–111)
Creatinine, Ser: 1.73 mg/dL — ABNORMAL HIGH (ref 0.44–1.00)
GFR, Estimated: 29 mL/min — ABNORMAL LOW (ref >60)
Glucose, Bld: 97 mg/dL (ref 70–99)
Potassium: 3.9 mmol/L (ref 3.5–5.1)
Sodium: 139 mmol/L (ref 135–145)

## 2023-06-24 MED ORDER — METOPROLOL SUCCINATE ER 25 MG PO TB24
12.5000 mg | ORAL_TABLET | Freq: Every day | ORAL | Status: DC
  Administered 2023-06-24 – 2023-07-04 (×11): 12.5 mg via ORAL
  Filled 2023-06-24 (×11): qty 1

## 2023-06-24 NOTE — Progress Notes (Addendum)
 PROGRESS NOTE  Eileen Wright  FMW:[id number] DOB: 12-08-1940 DOA: 06/21/2023 PCP: Joshua Ronal BRAVO, MD  Consultants  Brief Narrative: 83 y.o. female with a PMH significant for significant of COPD, HTN, HLD, dCHF, hypothyroidism, CKD 3B, anemia, former smoker, who presents with cough with greenish sputum production and SOB for 3 days. Patient also reports dysuria and burning micturition with left flank pain.  [facility] to [facility] and started on antibiotics for pneumonia.  Procalcitonin significantly elevated at 53.47, strep pneumo negative. Urine, sputum and blood cultures pending.  Broadening antibiotics to cefepime  while waiting for cultures.   6/20: Vital stable, preliminary blood cultures growing gram-negative rods identified as Enrerobacterales-pending susceptibility but patient is on cefepime  which should be able to cover.  Strep pneumo negative, urine cultures with abundant gram-positive cocci, gram-positive rods and gram-negative rods-pending final results.  Procalcitonin improving, now at 16.03, some improvement of leukocytosis to 21.2, bicarb at 16 and slowly improving renal function with creatinine at 2.22.  Adding some bicarb tablet.  Assessment & Plan:  Sepsis due to pneumonia (HCC) - Overall patient feels much better than prior to admission. - Still not quite at baseline.  But strength is improving.  -  Strep pneumo negative.  Preliminary blood cultures with Enterobacterales-->Pantoea agglomerans.  Urine cultures with multiple species.  Sputum cultures with Pseudomonas and Serratia liquefaciens.  Improving procalcitonin - Continue with cefepime /doxycycline  combination - Once we have speciation, will consult ID for duration of PO abx as well as best choice -Continue supportive care   Acute pyelonephritis Patient has a positive urinalysis, typical symptoms for urinary infection, with left flank pain, indicating acute pyelonephritis.  CT renal stone study was without any acute  abnormality. -Urine cultures with multiple species -Continue with current antibiotics, cefepime  should cover urine bacteria.  Acute renal failure superimposed on stage 3b chronic kidney disease (HCC) Likely secondary to concern of sepsis and continuation of diuretics. - Creatinine downtrending to 1.73 from 3.37 on admission.  Unclear baseline creatinine - Will trend -Keep holding home diuretics and Zestoretic   COPD (chronic obstructive pulmonary disease) (HCC) No wheezing. - Continue with as needed bronchodilators and supportive care   Chronic diastolic CHF (congestive heart failure) (HCC) 2D echo 02/01/2022 showed EF > 55% with grade 1 diastolic dysfunction, patient has mild fluid overload as discussed above. BNP elevated at 500 with trace lower extremity edema. - Home spironolactone  and torsemide  as was held due to AKI which started improving now. - Keep holding diuretics for now -Monitor volume status closely   Hypothyroidism - Continue home Synthroid    Metabolic acidosis Seems multifactorial with CKD and recent sepsis. - Started on bicarb tablet, improving   Hypertension Blood pressure within lower goal. - Keep holding home Zestoretic, torsemide  and spironolactone  -Continuing metoprolol , decrease dose to 12.5 mg in light of relative hypotension since admission -As needed hydralazine   HLD (hyperlipidemia) - Continue the home Lipitor   Anemia in chronic kidney disease (CKD) Hemoglobin seems stable - Monitor hemoglobin -Transfuse if below 7   Hyperkalemia Mild hyperkalemia in the beginning likely due to hemolyzed sample as it was resolved on subsequent check - Continue to monitor   Overweight (BMI 25.0-29.9) Body weight 66.7 kg, BMI 26.90 - Encourage losing weight - Exercise healthy diet   DVT prophylaxis:  heparin  injection 5,000 Units Start: 06/21/23 2300  Code Status:   Code Status: Full Code Level of care: Telemetry Medical Status is:  Inpatient Disposition: Awaiting speciation for blood cultures.  Consults called: None  Subjective: Patient sitting up in bed eating breakfast on my exam.  Awake and alert.  No concerns or complaints, though still has mild cough.  No pain.  Reports breathing to be at baseline  Objective: Vitals:   06/23/23 2110 06/24/23 0412 06/24/23 0500 06/24/23 0727  BP: (!) 109/51 (!) 106/90  (!) 110/52  Pulse: 88 88  84  Resp: 16 18  16   Temp: 97.7 F (36.5 C) 97.7 F (36.5 C)  98 F (36.7 C)  TempSrc:    Oral  SpO2: 91% 90%  98%  Weight:   68.1 kg   Height:        Intake/Output Summary (Last 24 hours) at 06/24/2023 1417 Last data filed at 06/24/2023 1046 Gross per 24 hour  Intake 440 ml  Output 400 ml  Net 40 ml   Filed Weights   06/21/23 1928 06/24/23 0500  Weight: 66.7 kg 68.1 kg   Body mass index is 27.46 kg/m.  Gen: 83 y.o. female in no apparent distress.  Nontoxic Pulm: Non-labored breathing, distant breath sounds CV: Regular rate and rhythm.  GI: Abdomen soft, non-tender, non-distended Ext: Warm, no deformities, no pedal edema Skin: No rashes, lesions  Neuro: Alert and oriented. No focal neurological deficits. Psych: Calm  Judgement and insight appear normal. Mood & affect appropriate.     I have personally reviewed the following labs and images: CBC: Recent Labs  Lab 06/21/23 1955 06/22/23 0556 06/23/23 0959 06/24/23 0515  WBC 20.9* 24.0* 21.2* 19.4*  NEUTROABS 15.4*  --   --   --   HGB 8.7* 10.6* 9.4* 9.0*  HCT 26.5* 32.8* 28.8* 27.3*  MCV 92.7 94.0 92.3 91.3  PLT 465* 548* 527* 522*   BMP &GFR Recent Labs  Lab 06/21/23 1955 06/22/23 0020 06/22/23 0556 06/23/23 0959 06/24/23 0515  NA 135  --  137 139 139  K 5.2* 4.5 4.5 3.8 3.9  CL 106  --  112* 116* 116*  CO2 12*  --  15* 16* 18*  GLUCOSE 108*  --  69* 114* 97  BUN 105*  --  98* 76* 64*  CREATININE 3.37*  --  2.88* 2.22* 1.73*  CALCIUM  9.1  --  8.7* 9.4 9.9   Estimated Creatinine Clearance:  22.7 mL/min (A) (by C-G formula based on SCr of 1.73 mg/dL (H)). Liver & Pancreas: Recent Labs  Lab 06/21/23 1955  AST 20  ALT 10  ALKPHOS 98  BILITOT 1.0  PROT 7.1  ALBUMIN  2.8*   No results for input(s): LIPASE, AMYLASE in the last 168 hours. No results for input(s): AMMONIA in the last 168 hours. Diabetic: No results for input(s): HGBA1C in the last 72 hours. No results for input(s): GLUCAP in the last 168 hours. Cardiac Enzymes: No results for input(s): CKTOTAL, CKMB, CKMBINDEX, TROPONINI in the last 168 hours. No results for input(s): PROBNP in the last 8760 hours. Coagulation Profile: Recent Labs  Lab 06/21/23 1955  INR 1.2   Thyroid Function Tests: No results for input(s): TSH, T4TOTAL, FREET4, T3FREE, THYROIDAB in the last 72 hours. Lipid Profile: No results for input(s): CHOL, HDL, LDLCALC, TRIG, CHOLHDL, LDLDIRECT in the last 72 hours. Anemia Panel: No results for input(s): VITAMINB12, FOLATE, FERRITIN, TIBC, IRON, RETICCTPCT in the last 72 hours. Urine analysis:    Component Value Date/Time   COLORURINE YELLOW (A) 06/21/2023 1955   APPEARANCEUR CLOUDY (A) 06/21/2023 1955   LABSPEC 1.013 06/21/2023 1955   PHURINE 5.0 06/21/2023 1955   GLUCOSEU NEGATIVE 06/21/2023  1955   HGBUR NEGATIVE 06/21/2023 1955   BILIRUBINUR NEGATIVE 06/21/2023 1955   KETONESUR NEGATIVE 06/21/2023 1955   PROTEINUR 30 (A) 06/21/2023 1955   NITRITE NEGATIVE 06/21/2023 1955   LEUKOCYTESUR LARGE (A) 06/21/2023 1955   Sepsis Labs: Invalid input(s): PROCALCITONIN, LACTICIDVEN  Microbiology: Recent Results (from the past 240 hours)  Blood Culture (routine x 2)     Status: None (Preliminary result)   Collection Time: 06/21/23  7:55 PM   Specimen: BLOOD  Result Value Ref Range Status   Specimen Description BLOOD BLOOD LEFT ARM  Final   Special Requests   Final    BOTTLES DRAWN AEROBIC AND ANAEROBIC Blood Culture adequate volume    Culture   Final    NO GROWTH 3 DAYS Performed at [facility], [address], [place], [place] [address]    Report Status PENDING  Incomplete  Blood Culture (routine x 2)     Status: Abnormal (Preliminary result)   Collection Time: 06/21/23  7:55 PM   Specimen: BLOOD RIGHT ARM  Result Value Ref Range Status   Specimen Description   Final    BLOOD RIGHT ARM Performed at [facility], [address] [place], [place] [address]    Special Requests   Final    BOTTLES DRAWN AEROBIC AND ANAEROBIC Blood Culture adequate volume Performed at [facility], [address] [place], [place] [address]    Culture  Setup Time   Final    Organism ID to follow GRAM NEGATIVE RODS ANAEROBIC BOTTLE ONLY CRITICAL RESULT CALLED TO, READ BACK BY AND VERIFIED WITH: Raquel Rodriguez guzman @1816  on 06/22/23 skl Performed at [facility], [address] [place], [place] [address]    Culture (A)  Final    PANTOEA AGGLOMERANS SUSCEPTIBILITIES TO FOLLOW Performed at [facility], [address] [place], [place] [address]    Report Status PENDING  Incomplete  Urine Culture (for pregnant, neutropenic or urologic patients or patients with an indwelling urinary catheter)     Status: Abnormal   Collection Time: 06/21/23  7:55 PM   Specimen: Urine, Clean Catch  Result Value Ref Range Status   Specimen Description   Final    URINE, CLEAN CATCH Performed at [facility], [address] [place], [place] [address]    Special Requests   Final    NONE Performed at [facility], [address] [place], [place] [address]    Culture MULTIPLE SPECIES PRESENT, SUGGEST RECOLLECTION (A)  Final   Report Status 06/23/2023 FINAL  Final  Blood Culture ID Panel (Reflexed)     Status: Abnormal   Collection Time: 06/21/23  7:55 PM  Result Value Ref Range Status   Enterococcus faecalis NOT DETECTED NOT DETECTED Final   Enterococcus Faecium NOT DETECTED NOT  DETECTED Final   Listeria monocytogenes NOT DETECTED NOT DETECTED Final   Staphylococcus species NOT DETECTED NOT DETECTED Final   Staphylococcus aureus (BCID) NOT DETECTED NOT DETECTED Final   Staphylococcus epidermidis NOT DETECTED NOT DETECTED Final   Staphylococcus lugdunensis NOT DETECTED NOT DETECTED Final   Streptococcus species NOT DETECTED NOT DETECTED Final   Streptococcus agalactiae NOT DETECTED NOT DETECTED Final   Streptococcus pneumoniae NOT DETECTED NOT DETECTED Final   Streptococcus pyogenes NOT DETECTED NOT DETECTED Final   A.calcoaceticus-baumannii NOT DETECTED NOT DETECTED Final   Bacteroides fragilis NOT DETECTED NOT DETECTED Final   Enterobacterales DETECTED (A) NOT DETECTED Final    Comment: Enterobacterales represent  a large order of gram negative bacteria, not a single organism. Refer to culture for further identification. CRITICAL RESULT CALLED TO, READ BACK BY AND VERIFIED WITH: Raquel Rodriguez guzman @1816  on 06/22/23 skl    Enterobacter cloacae complex NOT DETECTED NOT DETECTED Final   Escherichia coli NOT DETECTED NOT DETECTED Final   Klebsiella aerogenes NOT DETECTED NOT DETECTED Final   Klebsiella oxytoca NOT DETECTED NOT DETECTED Final   Klebsiella pneumoniae NOT DETECTED NOT DETECTED Final   Proteus species NOT DETECTED NOT DETECTED Final   Salmonella species NOT DETECTED NOT DETECTED Final   Serratia marcescens NOT DETECTED NOT DETECTED Final   Haemophilus influenzae NOT DETECTED NOT DETECTED Final   Neisseria meningitidis NOT DETECTED NOT DETECTED Final   Pseudomonas aeruginosa NOT DETECTED NOT DETECTED Final   Stenotrophomonas maltophilia NOT DETECTED NOT DETECTED Final   Candida albicans NOT DETECTED NOT DETECTED Final   Candida auris NOT DETECTED NOT DETECTED Final   Candida glabrata NOT DETECTED NOT DETECTED Final   Candida krusei NOT DETECTED NOT DETECTED Final   Candida parapsilosis NOT DETECTED NOT DETECTED Final   Candida tropicalis NOT  DETECTED NOT DETECTED Final   Cryptococcus neoformans/gattii NOT DETECTED NOT DETECTED Final   CTX-M ESBL NOT DETECTED NOT DETECTED Final   Carbapenem resistance IMP NOT DETECTED NOT DETECTED Final   Carbapenem resistance KPC NOT DETECTED NOT DETECTED Final   Carbapenem resistance NDM NOT DETECTED NOT DETECTED Final   Carbapenem resist OXA 48 LIKE NOT DETECTED NOT DETECTED Final   Carbapenem resistance VIM NOT DETECTED NOT DETECTED Final    Comment: Performed at [facility], [address] [place], [place] [address]  Expectorated Sputum Assessment w Gram Stain, Rflx to Resp Cult     Status: None   Collection Time: 06/22/23 12:20 AM   Specimen: Urine, Clean Catch; Sputum  Result Value Ref Range Status   Specimen Description SPU  Final   Special Requests NONE  Final   Sputum evaluation   Final    THIS SPECIMEN IS ACCEPTABLE FOR SPUTUM CULTURE Performed at [facility], [address] [place], [place] [address]    Report Status 06/22/2023 FINAL  Final  Resp panel by RT-PCR (RSV, Flu A&B, Covid) Urine, Clean Catch     Status: None   Collection Time: 06/22/23 12:20 AM   Specimen: Urine, Clean Catch; Nasal Swab  Result Value Ref Range Status   SARS Coronavirus 2 by RT PCR NEGATIVE NEGATIVE Final    Comment: (NOTE) SARS-CoV-2 target nucleic acids are NOT DETECTED.  The SARS-CoV-2 RNA is generally detectable in upper respiratory specimens during the acute phase of infection. The lowest concentration of SARS-CoV-2 viral copies this assay can detect is 138 copies/mL. A negative result does not preclude SARS-Cov-2 infection and should not be used as the sole basis for treatment or other patient management decisions. A negative result may occur with  improper specimen collection/handling, submission of specimen other than nasopharyngeal swab, presence of viral mutation(s) within the areas targeted by this assay, and inadequate number of viral copies(<138 copies/mL). A  negative result must be combined with clinical observations, patient history, and epidemiological information. The expected result is Negative.  Fact Sheet for Patients:  BloggerCourse.com  Fact Sheet for Healthcare Providers:  SeriousBroker.it  This test is no t yet approved or cleared by the [place]  FDA and  has been authorized for detection and/or diagnosis of SARS-CoV-2 by FDA under an Emergency Use Authorization (EUA). This EUA will remain  in effect (meaning this test can be used) for the duration of the COVID-19 declaration under Section 564(b)(1) of the Act, 21 U.S.C.section 360bbb-3(b)(1), unless the authorization is terminated  or revoked sooner.       Influenza A by PCR NEGATIVE NEGATIVE Final   Influenza B by PCR NEGATIVE NEGATIVE Final    Comment: (NOTE) The Xpert Xpress SARS-CoV-2/FLU/RSV plus assay is intended as an aid in the diagnosis of influenza from Nasopharyngeal swab specimens and should not be used as a sole basis for treatment. Nasal washings and aspirates are unacceptable for Xpert Xpress SARS-CoV-2/FLU/RSV testing.  Fact Sheet for Patients: BloggerCourse.com  Fact Sheet for Healthcare Providers: SeriousBroker.it  This test is not yet approved or cleared by the [place]  FDA and has been authorized for detection and/or diagnosis of SARS-CoV-2 by FDA under an Emergency Use Authorization (EUA). This EUA will remain in effect (meaning this test can be used) for the duration of the COVID-19 declaration under Section 564(b)(1) of the Act, 21 U.S.C. section 360bbb-3(b)(1), unless the authorization is terminated or revoked.     Resp Syncytial Virus by PCR NEGATIVE NEGATIVE Final    Comment: (NOTE) Fact Sheet for Patients: BloggerCourse.com  Fact Sheet for Healthcare  Providers: SeriousBroker.it  This test is not yet approved or cleared by the [place]  FDA and has been authorized for detection and/or diagnosis of SARS-CoV-2 by FDA under an Emergency Use Authorization (EUA). This EUA will remain in effect (meaning this test can be used) for the duration of the COVID-19 declaration under Section 564(b)(1) of the Act, 21 U.S.C. section 360bbb-3(b)(1), unless the authorization is terminated or revoked.  Performed at [facility], [address] [place], [place] [address]   Culture, Respiratory w Gram Stain     Status: None (Preliminary result)   Collection Time: 06/22/23 12:20 AM   Specimen: Sputum  Result Value Ref Range Status   Specimen Description   Final    SPU Performed at [facility], [address] [place], [place] [address]    Special Requests   Final    NONE Reflexed from [id number] Performed at [facility], [address] [place], [place] [address]    Gram Stain   Final    ABUNDANT WBC PRESENT, PREDOMINANTLY PMN ABUNDANT SQUAMOUS EPITHELIAL CELLS PRESENT ABUNDANT GRAM POSITIVE COCCI ABUNDANT GRAM POSITIVE RODS MODERATE GRAM NEGATIVE RODS    Culture   Final    ABUNDANT PSEUDOMONAS AERUGINOSA ABUNDANT SERRATIA LIQUEFACIENS SUSCEPTIBILITIES TO FOLLOW Performed at [facility], [address] [place], [place] [address]    Report Status PENDING  Incomplete    Radiology Studies: No results found.  Scheduled Meds:  atorvastatin   10 mg Oral QHS   budesonide -glycopyrrolate -formoterol   2 puff Inhalation BID   cholecalciferol   5,000 Units Oral Daily   heparin   5,000 Units Subcutaneous Q8H   levothyroxine   100 mcg Oral Q0600   loratadine   10 mg Oral Daily   metoprolol  succinate  12.5 mg Oral Daily   pantoprazole   40 mg Oral Daily   sodium bicarbonate   1,300 mg Oral BID   Continuous Infusions:  ceFEPime  (MAXIPIME ) IV 2 g (06/24/23 0958)   doxycycline   (VIBRAMYCIN ) IV 100 mg (06/24/23 1046)     LOS: 2 days   35 minutes with more than 50% spent in reviewing records, counseling patient/family and coordinating care.  Reyes VEAR Gaw, MD [facility] [email or web address] 06/24/2023, 2:17 PM

## 2023-06-24 NOTE — Plan of Care (Signed)
  Problem: Education: Goal: Knowledge of General Education information will improve Description: Including pain rating scale, medication(s)/side effects and non-pharmacologic comfort measures Outcome: Progressing   Problem: Health Behavior/Discharge Planning: Goal: Ability to manage health-related needs will improve Outcome: Progressing   Problem: Clinical Measurements: Goal: Ability to maintain clinical measurements within normal limits will improve Outcome: Progressing Goal: Will remain free from infection Outcome: Progressing Goal: Diagnostic test results will improve Outcome: Progressing Goal: Respiratory complications will improve Outcome: Progressing Goal: Cardiovascular complication will be avoided Outcome: Progressing   Problem: Activity: Goal: Risk for activity intolerance will decrease Outcome: Progressing   Problem: Coping: Goal: Level of anxiety will decrease Outcome: Progressing   Problem: Elimination: Goal: Will not experience complications related to bowel motility Outcome: Progressing Goal: Will not experience complications related to urinary retention Outcome: Progressing   Problem: Pain Managment: Goal: General experience of comfort will improve and/or be controlled Outcome: Progressing   Problem: Skin Integrity: Goal: Risk for impaired skin integrity will decrease Outcome: Progressing   Problem: Education: Goal: Ability to demonstrate management of disease process will improve Outcome: Progressing Goal: Ability to verbalize understanding of medication therapies will improve Outcome: Progressing Goal: Individualized Educational Video(s) Outcome: Progressing   Problem: Clinical Measurements: Goal: Ability to maintain a body temperature in the normal range will improve Outcome: Progressing   Problem: Activity: Goal: Ability to tolerate increased activity will improve Outcome: Progressing   Problem: Cardiac: Goal: Ability to achieve and  maintain adequate cardiopulmonary perfusion will improve Outcome: Progressing   Problem: Respiratory: Goal: Ability to maintain adequate ventilation will improve Outcome: Progressing Goal: Ability to maintain a clear airway will improve Outcome: Progressing

## 2023-06-25 DIAGNOSIS — J189 Pneumonia, unspecified organism: Secondary | ICD-10-CM | POA: Diagnosis not present

## 2023-06-25 DIAGNOSIS — A419 Sepsis, unspecified organism: Secondary | ICD-10-CM | POA: Diagnosis not present

## 2023-06-25 LAB — CBC
HCT: 27.5 % — ABNORMAL LOW (ref 36.0–46.0)
Hemoglobin: 9.1 g/dL — ABNORMAL LOW (ref 12.0–15.0)
MCH: 30 pg (ref 26.0–34.0)
MCHC: 33.1 g/dL (ref 30.0–36.0)
MCV: 90.8 fL (ref 80.0–100.0)
Platelets: 517 10*3/uL — ABNORMAL HIGH (ref 150–400)
RBC: 3.03 MIL/uL — ABNORMAL LOW (ref 3.87–5.11)
RDW: 15 % (ref 11.5–15.5)
WBC: 21 10*3/uL — ABNORMAL HIGH (ref 4.0–10.5)
nRBC: 0 % (ref 0.0–0.2)

## 2023-06-25 LAB — BASIC METABOLIC PANEL WITH GFR
Anion gap: 7 (ref 5–15)
BUN: 47 mg/dL — ABNORMAL HIGH (ref 8–23)
CO2: 18 mmol/L — ABNORMAL LOW (ref 22–32)
Calcium: 10.3 mg/dL (ref 8.9–10.3)
Chloride: 115 mmol/L — ABNORMAL HIGH (ref 98–111)
Creatinine, Ser: 1.39 mg/dL — ABNORMAL HIGH (ref 0.44–1.00)
GFR, Estimated: 38 mL/min — ABNORMAL LOW (ref >60)
Glucose, Bld: 96 mg/dL (ref 70–99)
Potassium: 3.8 mmol/L (ref 3.5–5.1)
Sodium: 140 mmol/L (ref 135–145)

## 2023-06-25 NOTE — Plan of Care (Signed)
   Problem: Clinical Measurements: Goal: Will remain free from infection Outcome: Progressing Goal: Diagnostic test results will improve Outcome: Progressing   Problem: Nutrition: Goal: Adequate nutrition will be maintained Outcome: Progressing   Problem: Coping: Goal: Level of anxiety will decrease Outcome: Progressing

## 2023-06-25 NOTE — Plan of Care (Signed)

## 2023-06-25 NOTE — Progress Notes (Signed)
 PROGRESS NOTE  Eileen Wright  FMW:[id number] DOB: 13-Dec-1940 DOA: 06/21/2023 PCP: Joshua Ronal BRAVO, MD  Consultants  Brief Narrative: 83 y.o. female with a PMH significant for significant of COPD, HTN, HLD, dCHF, hypothyroidism, CKD 3B, anemia, former smoker, who presents with cough with greenish sputum production and SOB for 3 days. Patient also reports dysuria and burning micturition with left flank pain.  [facility] and started on antibiotics for pneumonia.  -  Strep pneumo negative.  Preliminary blood cultures with Enterobacterales-->Pantoea agglomerans.  Urine cultures with multiple species.  Sputum cultures with Pseudomonas and Serratia liquefaciens.    Assessment & Plan:  Sepsis due to pneumonia [facility]) - Patient continues to feel subjectively better.  Strength and breathing both improving. - Continue with cefepime /doxycycline  combination as we await final speciation.  Spoke with ID on-call who recommended to continue current therapy until speciation does arrive. - Persistent leukocytosis noted.  CT scan negative for any intra-abdominal abscess   Acute pyelonephritis Patient has a positive urinalysis, typical symptoms for urinary infection, with left flank pain, indicating acute pyelonephritis.  CT renal stone study was without any acute abnormality. -Urine cultures with multiple species -Continue with current antibiotics, cefepime  should cover urine bacteria.  Acute renal failure superimposed on stage 3b chronic kidney disease (HCC) Likely secondary to concern of sepsis and continuation of diuretics. - Creatinine downtrending to 1.39 from 3.37 on admission.  Unclear baseline creatinine, going back to 2022 send like this is within her normal range - Will trend -Keep holding home diuretics and Zestoretic   COPD (chronic obstructive pulmonary disease) (HCC) No wheezing. - Continue with as needed bronchodilators and supportive care   Chronic diastolic CHF (congestive  heart failure) (HCC) 2D echo 02/01/2022 showed EF > 55% with grade 1 diastolic dysfunction, patient has mild fluid overload as discussed above. BNP elevated at 500 with trace lower extremity edema. - Home spironolactone  and torsemide  as was held due to AKI which started improving now. - Keep holding diuretics for now -Monitor volume status closely   Hypothyroidism - Continue home Synthroid    Metabolic acidosis Seems multifactorial with CKD and recent sepsis. - Started on bicarb tablet, improving   Hypertension Blood pressure within lower goal. - Keep holding home Zestoretic, torsemide  and spironolactone  -Continuing metoprolol , decrease dose to 12.5 mg in light of relative hypotension since admission -As needed hydralazine   HLD (hyperlipidemia) - Continue the home Lipitor   Anemia in chronic kidney disease (CKD) Hemoglobin seems stable - Monitor hemoglobin -Transfuse if below 7   Hyperkalemia Mild hyperkalemia in the beginning likely due to hemolyzed sample as it was resolved on subsequent check - Continue to monitor   Overweight (BMI 25.0-29.9) Body weight 66.7 kg, BMI 26.90 - Encourage losing weight - Exercise healthy diet   DVT prophylaxis:  heparin  injection 5,000 Units Start: 06/21/23 2300  Code Status:   Code Status: Full Code Level of care: Telemetry Medical Status is: Inpatient Disposition: Awaiting speciation for blood cultures.  Consults called: None  Subjective: Patient sitting up in bed eating breakfast on my exam.  Awake and alert.  No concerns or complaints, cough improved.  No pain.  Reports breathing to be at baseline  Objective: Vitals:   06/24/23 2035 06/25/23 0500 06/25/23 0516 06/25/23 0725  BP: 127/62  (!) 118/55 (!) 111/57  Pulse: 93  86 89  Resp: 18  14 17   Temp: 97.9 F (36.6 C)  98.5 F (36.9 C) 97.8 F (36.6 C)  TempSrc:  Oral  SpO2: 97%  96% 97%  Weight:  68.6 kg    Height:        Intake/Output Summary (Last 24 hours) at  06/25/2023 1402 Last data filed at 06/25/2023 0900 Gross per 24 hour  Intake 490 ml  Output --  Net 490 ml   Filed Weights   06/21/23 1928 06/24/23 0500 06/25/23 0500  Weight: 66.7 kg 68.1 kg 68.6 kg   Body mass index is 27.66 kg/m.  Gen: 83 y.o. female in no apparent distress.  Nontoxic Pulm: Non-labored breathing, distant breath sounds CV: Regular rate and rhythm.  GI: Abdomen soft, non-tender, non-distended Ext: Warm, no deformities, no pedal edema Skin: No rashes, lesions  Neuro: Alert and oriented. No focal neurological deficits. Psych: Calm  Judgement and insight appear normal. Mood & affect appropriate.     I have personally reviewed the following labs and images: CBC: Recent Labs  Lab 06/21/23 1955 06/22/23 0556 06/23/23 0959 06/24/23 0515 06/25/23 0601  WBC 20.9* 24.0* 21.2* 19.4* 21.0*  NEUTROABS 15.4*  --   --   --   --   HGB 8.7* 10.6* 9.4* 9.0* 9.1*  HCT 26.5* 32.8* 28.8* 27.3* 27.5*  MCV 92.7 94.0 92.3 91.3 90.8  PLT 465* 548* 527* 522* 517*   BMP &GFR Recent Labs  Lab 06/21/23 1955 06/22/23 0020 06/22/23 0556 06/23/23 0959 06/24/23 0515 06/25/23 0601  NA 135  --  137 139 139 140  K 5.2* 4.5 4.5 3.8 3.9 3.8  CL 106  --  112* 116* 116* 115*  CO2 12*  --  15* 16* 18* 18*  GLUCOSE 108*  --  69* 114* 97 96  BUN 105*  --  98* 76* 64* 47*  CREATININE 3.37*  --  2.88* 2.22* 1.73* 1.39*  CALCIUM  9.1  --  8.7* 9.4 9.9 10.3   Estimated Creatinine Clearance: 28.3 mL/min (A) (by C-G formula based on SCr of 1.39 mg/dL (H)). Liver & Pancreas: Recent Labs  Lab 06/21/23 1955  AST 20  ALT 10  ALKPHOS 98  BILITOT 1.0  PROT 7.1  ALBUMIN  2.8*   No results for input(s): LIPASE, AMYLASE in the last 168 hours. No results for input(s): AMMONIA in the last 168 hours. Diabetic: No results for input(s): HGBA1C in the last 72 hours. No results for input(s): GLUCAP in the last 168 hours. Cardiac Enzymes: No results for input(s): CKTOTAL, CKMB,  CKMBINDEX, TROPONINI in the last 168 hours. No results for input(s): PROBNP in the last 8760 hours. Coagulation Profile: Recent Labs  Lab 06/21/23 1955  INR 1.2   Thyroid Function Tests: No results for input(s): TSH, T4TOTAL, FREET4, T3FREE, THYROIDAB in the last 72 hours. Lipid Profile: No results for input(s): CHOL, HDL, LDLCALC, TRIG, CHOLHDL, LDLDIRECT in the last 72 hours. Anemia Panel: No results for input(s): VITAMINB12, FOLATE, FERRITIN, TIBC, IRON, RETICCTPCT in the last 72 hours. Urine analysis:    Component Value Date/Time   COLORURINE YELLOW (A) 06/21/2023 1955   APPEARANCEUR CLOUDY (A) 06/21/2023 1955   LABSPEC 1.013 06/21/2023 1955   PHURINE 5.0 06/21/2023 1955   GLUCOSEU NEGATIVE 06/21/2023 1955   HGBUR NEGATIVE 06/21/2023 1955   BILIRUBINUR NEGATIVE 06/21/2023 1955   KETONESUR NEGATIVE 06/21/2023 1955   PROTEINUR 30 (A) 06/21/2023 1955   NITRITE NEGATIVE 06/21/2023 1955   LEUKOCYTESUR LARGE (A) 06/21/2023 1955   Sepsis Labs: Invalid input(s): PROCALCITONIN, LACTICIDVEN  Microbiology: Recent Results (from the past 240 hours)  Blood Culture (routine x 2)  Status: None (Preliminary result)   Collection Time: 06/21/23  7:55 PM   Specimen: BLOOD  Result Value Ref Range Status   Specimen Description BLOOD BLOOD LEFT ARM  Final   Special Requests   Final    BOTTLES DRAWN AEROBIC AND ANAEROBIC Blood Culture adequate volume   Culture   Final    NO GROWTH 4 DAYS Performed at [facility], [address], [place], [place] [address]    Report Status PENDING  Incomplete  Blood Culture (routine x 2)     Status: Abnormal (Preliminary result)   Collection Time: 06/21/23  7:55 PM   Specimen: BLOOD RIGHT ARM  Result Value Ref Range Status   Specimen Description   Final    BLOOD RIGHT ARM Performed at [facility], [address] [place], [place] [address]    Special Requests   Final    BOTTLES DRAWN  AEROBIC AND ANAEROBIC Blood Culture adequate volume Performed at [facility] [address] [place], [place] [address]    Culture  Setup Time   Final    Organism ID to follow GRAM NEGATIVE RODS ANAEROBIC BOTTLE ONLY CRITICAL RESULT CALLED TO, READ BACK BY AND VERIFIED WITH: Raquel Rodriguez guzman @1816  on 06/22/23 skl Performed at [facility], [address], [place], [place] [address]    Culture (A)  Final    PANTOEA AGGLOMERANS SUSCEPTIBILITIES TO FOLLOW Performed at [facility], [address], [place], [place] [address]    Report Status PENDING  Incomplete  Urine Culture (for pregnant, neutropenic or urologic patients or patients with an indwelling urinary catheter)     Status: Abnormal   Collection Time: 06/21/23  7:55 PM   Specimen: Urine, Clean Catch  Result Value Ref Range Status   Specimen Description   Final    URINE, CLEAN CATCH Performed at [facility], [address], [place], [place] [address]    Special Requests   Final    NONE Performed at [facility], [address] [place], [place] [address]    Culture MULTIPLE SPECIES PRESENT, SUGGEST RECOLLECTION (A)  Final   Report Status 06/23/2023 FINAL  Final  Blood Culture ID Panel (Reflexed)     Status: Abnormal   Collection Time: 06/21/23  7:55 PM  Result Value Ref Range Status   Enterococcus faecalis NOT DETECTED NOT DETECTED Final   Enterococcus Faecium NOT DETECTED NOT DETECTED Final   Listeria monocytogenes NOT DETECTED NOT DETECTED Final   Staphylococcus species NOT DETECTED NOT DETECTED Final   Staphylococcus aureus (BCID) NOT DETECTED NOT DETECTED Final   Staphylococcus epidermidis NOT DETECTED NOT DETECTED Final   Staphylococcus lugdunensis NOT DETECTED NOT DETECTED Final   Streptococcus species NOT DETECTED NOT DETECTED Final   Streptococcus agalactiae NOT DETECTED NOT DETECTED Final   Streptococcus pneumoniae NOT DETECTED NOT DETECTED Final    Streptococcus pyogenes NOT DETECTED NOT DETECTED Final   A.calcoaceticus-baumannii NOT DETECTED NOT DETECTED Final   Bacteroides fragilis NOT DETECTED NOT DETECTED Final   Enterobacterales DETECTED (A) NOT DETECTED Final    Comment: Enterobacterales represent a large order of gram negative bacteria, not a single organism. Refer to culture for further identification. CRITICAL RESULT CALLED TO, READ BACK BY AND VERIFIED WITH: Raquel Rodriguez guzman @1816  on 06/22/23 skl    Enterobacter cloacae complex NOT DETECTED NOT DETECTED Final   Escherichia coli NOT DETECTED NOT DETECTED Final   Klebsiella aerogenes NOT DETECTED NOT DETECTED Final   Klebsiella  oxytoca NOT DETECTED NOT DETECTED Final   Klebsiella pneumoniae NOT DETECTED NOT DETECTED Final   Proteus species NOT DETECTED NOT DETECTED Final   Salmonella species NOT DETECTED NOT DETECTED Final   Serratia marcescens NOT DETECTED NOT DETECTED Final   Haemophilus influenzae NOT DETECTED NOT DETECTED Final   Neisseria meningitidis NOT DETECTED NOT DETECTED Final   Pseudomonas aeruginosa NOT DETECTED NOT DETECTED Final   Stenotrophomonas maltophilia NOT DETECTED NOT DETECTED Final   Candida albicans NOT DETECTED NOT DETECTED Final   Candida auris NOT DETECTED NOT DETECTED Final   Candida glabrata NOT DETECTED NOT DETECTED Final   Candida krusei NOT DETECTED NOT DETECTED Final   Candida parapsilosis NOT DETECTED NOT DETECTED Final   Candida tropicalis NOT DETECTED NOT DETECTED Final   Cryptococcus neoformans/gattii NOT DETECTED NOT DETECTED Final   CTX-M ESBL NOT DETECTED NOT DETECTED Final   Carbapenem resistance IMP NOT DETECTED NOT DETECTED Final   Carbapenem resistance KPC NOT DETECTED NOT DETECTED Final   Carbapenem resistance NDM NOT DETECTED NOT DETECTED Final   Carbapenem resist OXA 48 LIKE NOT DETECTED NOT DETECTED Final   Carbapenem resistance VIM NOT DETECTED NOT DETECTED Final    Comment: Performed at [facility], [address] [place], [place] [address]  Expectorated Sputum Assessment w Gram Stain, Rflx to Resp Cult     Status: None   Collection Time: 06/22/23 12:20 AM   Specimen: Urine, Clean Catch; Sputum  Result Value Ref Range Status   Specimen Description SPU  Final   Special Requests NONE  Final   Sputum evaluation   Final    THIS SPECIMEN IS ACCEPTABLE FOR SPUTUM CULTURE Performed at [facility], [address] [place], [place] [address]    Report Status 06/22/2023 FINAL  Final  Resp panel by RT-PCR (RSV, Flu A&B, Covid) Urine, Clean Catch     Status: None   Collection Time: 06/22/23 12:20 AM   Specimen: Urine, Clean Catch; Nasal Swab  Result Value Ref Range Status   SARS Coronavirus 2 by RT PCR NEGATIVE NEGATIVE Final    Comment: (NOTE) SARS-CoV-2 target nucleic acids are NOT DETECTED.  The SARS-CoV-2 RNA is generally detectable in upper respiratory specimens during the acute phase of infection. The lowest concentration of SARS-CoV-2 viral copies this assay can detect is 138 copies/mL. A negative result does not preclude SARS-Cov-2 infection and should not be used as the sole basis for treatment or other patient management decisions. A negative result may occur with  improper specimen collection/handling, submission of specimen other than nasopharyngeal swab, presence of viral mutation(s) within the areas targeted by this assay, and inadequate number of viral copies(<138 copies/mL). A negative result must be combined with clinical observations, patient history, and epidemiological information. The expected result is Negative.  Fact Sheet for Patients:  BloggerCourse.com  Fact Sheet for Healthcare Providers:  SeriousBroker.it  This test is no t yet approved or cleared by the [place]  FDA and  has been authorized for detection and/or diagnosis of SARS-CoV-2 by FDA under an Emergency Use Authorization (EUA). This EUA  will remain  in effect (meaning this test can be used) for the duration of the COVID-19 declaration under Section 564(b)(1) of the Act, 21 U.S.C.section 360bbb-3(b)(1), unless the authorization is terminated  or revoked sooner.       Influenza A by PCR NEGATIVE NEGATIVE Final   Influenza B by PCR NEGATIVE NEGATIVE Final    Comment: (NOTE) The Xpert Xpress SARS-CoV-2/FLU/RSV plus assay  is intended as an aid in the diagnosis of influenza from Nasopharyngeal swab specimens and should not be used as a sole basis for treatment. Nasal washings and aspirates are unacceptable for Xpert Xpress SARS-CoV-2/FLU/RSV testing.  Fact Sheet for Patients: BloggerCourse.com  Fact Sheet for Healthcare Providers: SeriousBroker.it  This test is not yet approved or cleared by the [place]  FDA and has been authorized for detection and/or diagnosis of SARS-CoV-2 by FDA under an Emergency Use Authorization (EUA). This EUA will remain in effect (meaning this test can be used) for the duration of the COVID-19 declaration under Section 564(b)(1) of the Act, 21 U.S.C. section 360bbb-3(b)(1), unless the authorization is terminated or revoked.     Resp Syncytial Virus by PCR NEGATIVE NEGATIVE Final    Comment: (NOTE) Fact Sheet for Patients: BloggerCourse.com  Fact Sheet for Healthcare Providers: SeriousBroker.it  This test is not yet approved or cleared by the [place]  FDA and has been authorized for detection and/or diagnosis of SARS-CoV-2 by FDA under an Emergency Use Authorization (EUA). This EUA will remain in effect (meaning this test can be used) for the duration of the COVID-19 declaration under Section 564(b)(1) of the Act, 21 U.S.C. section 360bbb-3(b)(1), unless the authorization is terminated or revoked.  Performed at [facility], [address] [place], [place]  [address]   Culture, Respiratory w Gram Stain     Status: None (Preliminary result)   Collection Time: 06/22/23 12:20 AM   Specimen: Sputum  Result Value Ref Range Status   Specimen Description   Final    SPU Performed at [facility], [address], [place], [place] [address]    Special Requests   Final    NONE Reflexed from [id number] Performed at [facility], [address] [place], [place] [address]    Gram Stain   Final    ABUNDANT WBC PRESENT, PREDOMINANTLY PMN ABUNDANT SQUAMOUS EPITHELIAL CELLS PRESENT ABUNDANT GRAM POSITIVE COCCI ABUNDANT GRAM POSITIVE RODS MODERATE GRAM NEGATIVE RODS    Culture   Final    ABUNDANT PSEUDOMONAS AERUGINOSA ABUNDANT SERRATIA LIQUEFACIENS Two isolates with different morphologies were identified as the same organism.The most resistant organism was reported. FOR PSEUDOMONAS AERUGINOSA ABUNDANT STAPHYLOCOCCUS LUGDUNENSIS SUSCEPTIBILITIES TO FOLLOW Performed at [facility], [address], [place], [place] [address]    Report Status PENDING  Incomplete   Organism ID, Bacteria PSEUDOMONAS AERUGINOSA  Final   Organism ID, Bacteria SERRATIA LIQUEFACIENS  Final      Susceptibility   Pseudomonas aeruginosa - MIC*    CEFTAZIDIME <=1 SENSITIVE Sensitive     CIPROFLOXACIN <=0.25 SENSITIVE Sensitive     GENTAMICIN 2 SENSITIVE Sensitive     IMIPENEM 1 SENSITIVE Sensitive     PIP/TAZO <=4 SENSITIVE Sensitive ug/mL    CEFEPIME  2 SENSITIVE Sensitive     * ABUNDANT PSEUDOMONAS AERUGINOSA   Serratia liquefaciens - MIC*    CEFEPIME  <=0.12 SENSITIVE Sensitive     CEFTAZIDIME <=1 SENSITIVE Sensitive     CEFTRIAXONE  <=0.25 SENSITIVE Sensitive     CIPROFLOXACIN <=0.25 SENSITIVE Sensitive     GENTAMICIN <=1 SENSITIVE Sensitive     IMIPENEM 1 SENSITIVE Sensitive     TRIMETH/SULFA <=20 SENSITIVE Sensitive     PIP/TAZO <=4 SENSITIVE Sensitive ug/mL    * ABUNDANT SERRATIA LIQUEFACIENS    Radiology Studies: No results  found.  Scheduled Meds:  atorvastatin   10 mg Oral QHS   budesonide -glycopyrrolate -formoterol   2 puff Inhalation BID   cholecalciferol   5,000 Units  Oral Daily   heparin   5,000 Units Subcutaneous Q8H   levothyroxine   100 mcg Oral Q0600   loratadine   10 mg Oral Daily   metoprolol  succinate  12.5 mg Oral Daily   pantoprazole   40 mg Oral Daily   sodium bicarbonate   1,300 mg Oral BID   Continuous Infusions:  ceFEPime  (MAXIPIME ) IV 2 g (06/25/23 0920)   doxycycline  (VIBRAMYCIN ) IV 100 mg (06/25/23 1328)     LOS: 3 days   35 minutes with more than 50% spent in reviewing records, counseling patient/family and coordinating care.  Reyes VEAR Gaw, MD [facility] [email or web address] 06/25/2023, 2:02 PM

## 2023-06-26 DIAGNOSIS — J189 Pneumonia, unspecified organism: Secondary | ICD-10-CM | POA: Diagnosis not present

## 2023-06-26 DIAGNOSIS — A419 Sepsis, unspecified organism: Secondary | ICD-10-CM | POA: Diagnosis not present

## 2023-06-26 LAB — BASIC METABOLIC PANEL WITH GFR
Anion gap: 3 — ABNORMAL LOW (ref 5–15)
BUN: 39 mg/dL — ABNORMAL HIGH (ref 8–23)
CO2: 22 mmol/L (ref 22–32)
Calcium: 10.2 mg/dL (ref 8.9–10.3)
Chloride: 116 mmol/L — ABNORMAL HIGH (ref 98–111)
Creatinine, Ser: 1.31 mg/dL — ABNORMAL HIGH (ref 0.44–1.00)
GFR, Estimated: 41 mL/min — ABNORMAL LOW (ref >60)
Glucose, Bld: 98 mg/dL (ref 70–99)
Potassium: 3.6 mmol/L (ref 3.5–5.1)
Sodium: 141 mmol/L (ref 135–145)

## 2023-06-26 LAB — CULTURE, BLOOD (ROUTINE X 2)
Culture: NO GROWTH
Special Requests: ADEQUATE
Special Requests: ADEQUATE

## 2023-06-26 LAB — CBC
HCT: 26.9 % — ABNORMAL LOW (ref 36.0–46.0)
Hemoglobin: 8.5 g/dL — ABNORMAL LOW (ref 12.0–15.0)
MCH: 29.3 pg (ref 26.0–34.0)
MCHC: 31.6 g/dL (ref 30.0–36.0)
MCV: 92.8 fL (ref 80.0–100.0)
Platelets: 484 10*3/uL — ABNORMAL HIGH (ref 150–400)
RBC: 2.9 MIL/uL — ABNORMAL LOW (ref 3.87–5.11)
RDW: 15.2 % (ref 11.5–15.5)
WBC: 21.6 10*3/uL — ABNORMAL HIGH (ref 4.0–10.5)
nRBC: 0 % (ref 0.0–0.2)

## 2023-06-26 LAB — CULTURE, RESPIRATORY W GRAM STAIN

## 2023-06-26 MED ORDER — METRONIDAZOLE 500 MG PO TABS
500.0000 mg | ORAL_TABLET | Freq: Two times a day (BID) | ORAL | Status: DC
  Administered 2023-06-26 – 2023-07-04 (×17): 500 mg via ORAL
  Filled 2023-06-26 (×17): qty 1

## 2023-06-26 MED ORDER — SODIUM CHLORIDE 0.9 % IV SOLN
2.0000 g | Freq: Two times a day (BID) | INTRAVENOUS | Status: AC
  Administered 2023-06-26 – 2023-06-30 (×9): 2 g via INTRAVENOUS
  Filled 2023-06-26 (×9): qty 12.5

## 2023-06-26 NOTE — Consult Note (Addendum)
 Infectious Disease     Reason for Consult:  PNA, pyelo Referring Physician: Dr Elpidio Date of Admission:  06/21/2023   Principal Problem:   Sepsis due to pneumonia [facility]) Active Problems:   Hyperkalemia   Hypothyroidism   COPD (chronic obstructive pulmonary disease) (HCC)   Hypertension   Acute renal failure superimposed on stage 3b chronic kidney disease (HCC)   HLD (hyperlipidemia)   Chronic diastolic CHF (congestive heart failure) (HCC)   Anemia in chronic kidney disease (CKD)   Overweight (BMI 25.0-29.9)   Acute pyelonephritis   Metabolic acidosis   HPI: Eileen Wright is a 83 y.o. female with a history of multiple medical problems including COPD, hypertension, diastolic CHF, hypothyroidism, chronic kidney disease, anemia who was admitted June 18 with complaints of cough and shortness of breath for 3 days.  She was also having occasional loose stools and dysuria.  On admission white count was 20.9 BNP 500 lactic 11.  Urinalysis showed large leuks greater than 50 white cells.  Creatinine was 3.37 BUN 105.  Chest x-ray showed bilateral lower lobe opacity.  She was admitted with a diagnosis of pneumonia and UTI.  She was started initially on cefepime  and doxycycline . Since admit  blood cultures have grown Pantoea agglomerans in 1/2 sets.  UCX with multiple species  SPutum cx with multiple organisms including pseudomonas, serratia, staph lugdunensis She had a CT renal stone protocol 6/19  which showed No acute abd  findings but multifocal patchy opacities right lower lobe predominance suspicious for pneumonia Clinically she has been afebrile but white count remains elevated at 21,000. Renal function is improved down to 1.31.   She reports continued sputum produciton and cough. She reports in past has had issues swallowing.  Not on O2.     Past Medical History:  Diagnosis Date   Anemia    Arthritis    Cardiomyopathy (HCC)    COPD (chronic obstructive pulmonary disease) (HCC)     GERD (gastroesophageal reflux disease)    Heart murmur    Hyperlipemia    Hypertension    Hypothyroidism    Mitral valve insufficiency    Pneumonia    Past Surgical History:  Procedure Laterality Date   APPENDECTOMY     BREAST BIOPSY     TONSILLECTOMY     adenoids   TOTAL HIP ARTHROPLASTY Left 12/03/2020   Procedure: TOTAL HIP ARTHROPLASTY ANTERIOR APPROACH;  Surgeon: Fidel Rogue, MD;  Location: [facility];  Service: Orthopedics;  Laterality: Left;   Social History   Tobacco Use   Smoking status: Former    Types: Cigarettes   Smokeless tobacco: Never  Vaping Use   Vaping status: Never Used  Substance Use Topics   Alcohol  use: Never   Drug use: Never   Family History  Problem Relation Age of Onset   Heart failure Mother    Throat cancer Father    Bladder Cancer Sister     Allergies:  Allergies  Allergen Reactions   Epinephrine  Hives   Vancomycin Hives   Penicillins     Redness around injection site    Current antibiotics: Antibiotics Given (last 72 hours)     Date/Time Action Medication Dose Rate   06/23/23 2135 New Bag/Given   doxycycline  (VIBRAMYCIN ) 100 mg in sodium chloride  0.9 % 250 mL IVPB 100 mg 125 mL/hr   06/24/23 0958 New Bag/Given   ceFEPIme  (MAXIPIME ) 2 g in sodium chloride  0.9 % 100 mL IVPB 2 g 200 mL/hr   06/24/23 1046  New Bag/Given   doxycycline  (VIBRAMYCIN ) 100 mg in sodium chloride  0.9 % 250 mL IVPB 100 mg 125 mL/hr   06/24/23 2206 New Bag/Given   doxycycline  (VIBRAMYCIN ) 100 mg in sodium chloride  0.9 % 250 mL IVPB 100 mg 125 mL/hr   06/25/23 0920 New Bag/Given   ceFEPIme  (MAXIPIME ) 2 g in sodium chloride  0.9 % 100 mL IVPB 2 g 200 mL/hr   06/25/23 1107 New Bag/Given   doxycycline  (VIBRAMYCIN ) 100 mg in sodium chloride  0.9 % 250 mL IVPB 100 mg 125 mL/hr   06/25/23 1328 New Bag/Given   doxycycline  (VIBRAMYCIN ) 100 mg in sodium chloride  0.9 % 250 mL IVPB 100 mg 125 mL/hr   06/25/23 2148 New Bag/Given   doxycycline  (VIBRAMYCIN ) 100 mg in  sodium chloride  0.9 % 250 mL IVPB 100 mg 125 mL/hr   06/26/23 0908 New Bag/Given   ceFEPIme  (MAXIPIME ) 2 g in sodium chloride  0.9 % 100 mL IVPB 2 g 200 mL/hr   06/26/23 1026 New Bag/Given   doxycycline  (VIBRAMYCIN ) 100 mg in sodium chloride  0.9 % 250 mL IVPB 100 mg 125 mL/hr       MEDICATIONS:  atorvastatin   10 mg Oral QHS   budesonide -glycopyrrolate -formoterol   2 puff Inhalation BID   cholecalciferol   5,000 Units Oral Daily   heparin   5,000 Units Subcutaneous Q8H   levothyroxine   100 mcg Oral Q0600   loratadine   10 mg Oral Daily   metoprolol  succinate  12.5 mg Oral Daily   pantoprazole   40 mg Oral Daily   sodium bicarbonate   1,300 mg Oral BID    Review of Systems - 11 systems reviewed and negative per HPI   OBJECTIVE: Temp:  [97.1 F (36.2 C)-98.2 F (36.8 C)] 98.2 F (36.8 C) (06/23 0807) Pulse Rate:  [81-93] 89 (06/23 0807) Resp:  [17-20] 20 (06/23 0807) BP: (97-122)/(40-59) (P) 120/51 (06/23 0807) SpO2:  [90 %-100 %] 97 % (06/23 0807) Weight:  [68.7 kg] 68.7 kg (06/23 0553) Physical Exam  Constitutional:  oriented to person, place, and time. frail HENT: [place]/AT, PERRLA, no scleral icterus Mouth/Throat: Oropharynx is clear and moist. No oropharyngeal exudate.  Cardiovascular: Normal rate, regular rhythm and normal heart sounds.2/6 sm Pulmonary/Chest: decrease bs bil bases and rhonchi Neck = supple, no nuchal rigidity Abdominal: Soft. Bowel sounds are normal.  exhibits no distension. There is no tenderness.  Lymphadenopathy: no cervical adenopathy. No axillary adenopathy Neurological: alert and oriented to person, place, and time.  Skin: Skin is warm and dry. No rash noted. No erythema.  Psychiatric: a normal mood and affect.  behavior is normal.    LABS: Results for orders placed or performed during the hospital encounter of 06/21/23 (from the past 48 hours)  Basic metabolic panel with GFR     Status: Abnormal   Collection Time: 06/25/23  6:01 AM  Result Value Ref  Range   Sodium 140 135 - 145 mmol/L   Potassium 3.8 3.5 - 5.1 mmol/L   Chloride 115 (H) 98 - 111 mmol/L   CO2 18 (L) 22 - 32 mmol/L   Glucose, Bld 96 70 - 99 mg/dL    Comment: Glucose reference range applies only to samples taken after fasting for at least 8 hours.   BUN 47 (H) 8 - 23 mg/dL   Creatinine, Ser 8.60 (H) 0.44 - 1.00 mg/dL   Calcium  10.3 8.9 - 10.3 mg/dL   GFR, Estimated 38 (L) >60 mL/min    Comment: (NOTE) Calculated using the CKD-EPI Creatinine Equation (2021)  Anion gap 7 5 - 15    Comment: Performed at [facility], [address] [place], [place] [address]  CBC     Status: Abnormal   Collection Time: 06/25/23  6:01 AM  Result Value Ref Range   WBC 21.0 (H) 4.0 - 10.5 K/uL   RBC 3.03 (L) 3.87 - 5.11 MIL/uL   Hemoglobin 9.1 (L) 12.0 - 15.0 g/dL   HCT 72.4 (L) 63.9 - 53.9 %   MCV 90.8 80.0 - 100.0 fL   MCH 30.0 26.0 - 34.0 pg   MCHC 33.1 30.0 - 36.0 g/dL   RDW 84.9 88.4 - 84.4 %   Platelets 517 (H) 150 - 400 K/uL   nRBC 0.0 0.0 - 0.2 %    Comment: Performed at [facility], [address] [place], [place] [address]  Basic metabolic panel with GFR     Status: Abnormal   Collection Time: 06/26/23  2:05 AM  Result Value Ref Range   Sodium 141 135 - 145 mmol/L   Potassium 3.6 3.5 - 5.1 mmol/L   Chloride 116 (H) 98 - 111 mmol/L   CO2 22 22 - 32 mmol/L   Glucose, Bld 98 70 - 99 mg/dL    Comment: Glucose reference range applies only to samples taken after fasting for at least 8 hours.   BUN 39 (H) 8 - 23 mg/dL   Creatinine, Ser 8.68 (H) 0.44 - 1.00 mg/dL   Calcium  10.2 8.9 - 10.3 mg/dL   GFR, Estimated 41 (L) >60 mL/min    Comment: (NOTE) Calculated using the CKD-EPI Creatinine Equation (2021)    Anion gap 3 (L) 5 - 15    Comment: Performed at [facility], [address] [place], [place] [address]  CBC     Status: Abnormal   Collection Time: 06/26/23  2:05 AM  Result Value Ref Range   WBC 21.6 (H) 4.0 - 10.5 K/uL   RBC  2.90 (L) 3.87 - 5.11 MIL/uL   Hemoglobin 8.5 (L) 12.0 - 15.0 g/dL   HCT 73.0 (L) 63.9 - 53.9 %   MCV 92.8 80.0 - 100.0 fL   MCH 29.3 26.0 - 34.0 pg   MCHC 31.6 30.0 - 36.0 g/dL   RDW 84.7 88.4 - 84.4 %   Platelets 484 (H) 150 - 400 K/uL   nRBC 0.0 0.0 - 0.2 %    Comment: Performed at [facility], [address] [place], [place] [address]   No components found for: ESR, C REACTIVE PROTEIN MICRO: Recent Results (from the past 720 hours)  Blood Culture (routine x 2)     Status: None   Collection Time: 06/21/23  7:55 PM   Specimen: BLOOD  Result Value Ref Range Status   Specimen Description BLOOD BLOOD LEFT ARM  Final   Special Requests   Final    BOTTLES DRAWN AEROBIC AND ANAEROBIC Blood Culture adequate volume   Culture   Final    NO GROWTH 5 DAYS Performed at [facility], [address] [place], [place] [address]    Report Status 06/26/2023 FINAL  Final  Blood Culture (routine x 2)     Status: Abnormal (Preliminary result)   Collection Time: 06/21/23  7:55 PM   Specimen: BLOOD RIGHT ARM  Result Value Ref Range Status   Specimen Description   Final    BLOOD RIGHT ARM Performed at [facility], [address], [place], [place] [address]  Special Requests   Final    BOTTLES DRAWN AEROBIC AND ANAEROBIC Blood Culture adequate volume Performed at [facility], [address] [place], [place] [address]    Culture  Setup Time   Final    Organism ID to follow GRAM NEGATIVE RODS ANAEROBIC BOTTLE ONLY CRITICAL RESULT CALLED TO, READ BACK BY AND VERIFIED WITH: Raquel Rodriguez guzman @1816  on 06/22/23 skl Performed at [facility], [address], [place], [place] [address]    Culture (A)  Final    PANTOEA AGGLOMERANS SUSCEPTIBILITIES TO FOLLOW Performed at [facility], [address] [place], [place] [address]    Report Status PENDING  Incomplete  Urine Culture (for pregnant, neutropenic or urologic patients or  patients with an indwelling urinary catheter)     Status: Abnormal   Collection Time: 06/21/23  7:55 PM   Specimen: Urine, Clean Catch  Result Value Ref Range Status   Specimen Description   Final    URINE, CLEAN CATCH Performed at [facility], [address] [place], [place] [address]    Special Requests   Final    NONE Performed at [facility], [address] [place], [place] [address]    Culture MULTIPLE SPECIES PRESENT, SUGGEST RECOLLECTION (A)  Final   Report Status 06/23/2023 FINAL  Final  Blood Culture ID Panel (Reflexed)     Status: Abnormal   Collection Time: 06/21/23  7:55 PM  Result Value Ref Range Status   Enterococcus faecalis NOT DETECTED NOT DETECTED Final   Enterococcus Faecium NOT DETECTED NOT DETECTED Final   Listeria monocytogenes NOT DETECTED NOT DETECTED Final   Staphylococcus species NOT DETECTED NOT DETECTED Final   Staphylococcus aureus (BCID) NOT DETECTED NOT DETECTED Final   Staphylococcus epidermidis NOT DETECTED NOT DETECTED Final   Staphylococcus lugdunensis NOT DETECTED NOT DETECTED Final   Streptococcus species NOT DETECTED NOT DETECTED Final   Streptococcus agalactiae NOT DETECTED NOT DETECTED Final   Streptococcus pneumoniae NOT DETECTED NOT DETECTED Final   Streptococcus pyogenes NOT DETECTED NOT DETECTED Final   A.calcoaceticus-baumannii NOT DETECTED NOT DETECTED Final   Bacteroides fragilis NOT DETECTED NOT DETECTED Final   Enterobacterales DETECTED (A) NOT DETECTED Final    Comment: Enterobacterales represent a large order of gram negative bacteria, not a single organism. Refer to culture for further identification. CRITICAL RESULT CALLED TO, READ BACK BY AND VERIFIED WITH: Raquel Evern guzman @1816  on 06/22/23 skl    Enterobacter cloacae complex NOT DETECTED NOT DETECTED Final   Escherichia coli NOT DETECTED NOT DETECTED Final   Klebsiella aerogenes NOT DETECTED NOT DETECTED Final   Klebsiella oxytoca NOT DETECTED  NOT DETECTED Final   Klebsiella pneumoniae NOT DETECTED NOT DETECTED Final   Proteus species NOT DETECTED NOT DETECTED Final   Salmonella species NOT DETECTED NOT DETECTED Final   Serratia marcescens NOT DETECTED NOT DETECTED Final   Haemophilus influenzae NOT DETECTED NOT DETECTED Final   Neisseria meningitidis NOT DETECTED NOT DETECTED Final   Pseudomonas aeruginosa NOT DETECTED NOT DETECTED Final   Stenotrophomonas maltophilia NOT DETECTED NOT DETECTED Final   Candida albicans NOT DETECTED NOT DETECTED Final   Candida auris NOT DETECTED NOT DETECTED Final   Candida glabrata NOT DETECTED NOT DETECTED Final   Candida krusei NOT DETECTED NOT DETECTED Final   Candida parapsilosis NOT DETECTED NOT DETECTED Final   Candida tropicalis NOT DETECTED NOT DETECTED Final   Cryptococcus neoformans/gattii NOT DETECTED NOT DETECTED Final   CTX-M ESBL NOT DETECTED  NOT DETECTED Final   Carbapenem resistance IMP NOT DETECTED NOT DETECTED Final   Carbapenem resistance KPC NOT DETECTED NOT DETECTED Final   Carbapenem resistance NDM NOT DETECTED NOT DETECTED Final   Carbapenem resist OXA 48 LIKE NOT DETECTED NOT DETECTED Final   Carbapenem resistance VIM NOT DETECTED NOT DETECTED Final    Comment: Performed at [facility], [address] [place], [place] [address]  Expectorated Sputum Assessment w Gram Stain, Rflx to Resp Cult     Status: None   Collection Time: 06/22/23 12:20 AM   Specimen: Urine, Clean Catch; Sputum  Result Value Ref Range Status   Specimen Description SPU  Final   Special Requests NONE  Final   Sputum evaluation   Final    THIS SPECIMEN IS ACCEPTABLE FOR SPUTUM CULTURE Performed at [facility], [address] [place], [place] [address]    Report Status 06/22/2023 FINAL  Final  Resp panel by RT-PCR (RSV, Flu A&B, Covid) Urine, Clean Catch     Status: None   Collection Time: 06/22/23 12:20 AM   Specimen: Urine, Clean Catch; Nasal Swab  Result Value Ref  Range Status   SARS Coronavirus 2 by RT PCR NEGATIVE NEGATIVE Final    Comment: (NOTE) SARS-CoV-2 target nucleic acids are NOT DETECTED.  The SARS-CoV-2 RNA is generally detectable in upper respiratory specimens during the acute phase of infection. The lowest concentration of SARS-CoV-2 viral copies this assay can detect is 138 copies/mL. A negative result does not preclude SARS-Cov-2 infection and should not be used as the sole basis for treatment or other patient management decisions. A negative result may occur with  improper specimen collection/handling, submission of specimen other than nasopharyngeal swab, presence of viral mutation(s) within the areas targeted by this assay, and inadequate number of viral copies(<138 copies/mL). A negative result must be combined with clinical observations, patient history, and epidemiological information. The expected result is Negative.  Fact Sheet for Patients:  BloggerCourse.com  Fact Sheet for Healthcare Providers:  SeriousBroker.it  This test is no t yet approved or cleared by the [place]  FDA and  has been authorized for detection and/or diagnosis of SARS-CoV-2 by FDA under an Emergency Use Authorization (EUA). This EUA will remain  in effect (meaning this test can be used) for the duration of the COVID-19 declaration under Section 564(b)(1) of the Act, 21 U.S.C.section 360bbb-3(b)(1), unless the authorization is terminated  or revoked sooner.       Influenza A by PCR NEGATIVE NEGATIVE Final   Influenza B by PCR NEGATIVE NEGATIVE Final    Comment: (NOTE) The Xpert Xpress SARS-CoV-2/FLU/RSV plus assay is intended as an aid in the diagnosis of influenza from Nasopharyngeal swab specimens and should not be used as a sole basis for treatment. Nasal washings and aspirates are unacceptable for Xpert Xpress SARS-CoV-2/FLU/RSV testing.  Fact Sheet for  Patients: BloggerCourse.com  Fact Sheet for Healthcare Providers: SeriousBroker.it  This test is not yet approved or cleared by the [place]  FDA and has been authorized for detection and/or diagnosis of SARS-CoV-2 by FDA under an Emergency Use Authorization (EUA). This EUA will remain in effect (meaning this test can be used) for the duration of the COVID-19 declaration under Section 564(b)(1) of the Act, 21 U.S.C. section 360bbb-3(b)(1), unless the authorization is terminated or revoked.     Resp Syncytial Virus by PCR NEGATIVE NEGATIVE Final    Comment: (NOTE) Fact Sheet for Patients: BloggerCourse.com  Fact Sheet for Healthcare Providers: SeriousBroker.it  This test is not yet approved or cleared by the [place]  FDA and has been authorized for detection and/or diagnosis of SARS-CoV-2 by FDA under an Emergency Use Authorization (EUA). This EUA will remain in effect (meaning this test can be used) for the duration of the COVID-19 declaration under Section 564(b)(1) of the Act, 21 U.S.C. section 360bbb-3(b)(1), unless the authorization is terminated or revoked.  Performed at [facility], [address] [place], [place] [address]   Culture, Respiratory w Gram Stain     Status: None (Preliminary result)   Collection Time: 06/22/23 12:20 AM   Specimen: Sputum  Result Value Ref Range Status   Specimen Description   Final    SPU Performed at [facility], [address] [place], [place] [address]    Special Requests   Final    NONE Reflexed from [id number] Performed at [facility], [address] [place], [place] [address]    Gram Stain   Final    ABUNDANT WBC PRESENT, PREDOMINANTLY PMN ABUNDANT SQUAMOUS EPITHELIAL CELLS PRESENT ABUNDANT GRAM POSITIVE COCCI ABUNDANT GRAM POSITIVE RODS MODERATE GRAM NEGATIVE RODS    Culture   Final     ABUNDANT PSEUDOMONAS AERUGINOSA ABUNDANT SERRATIA LIQUEFACIENS Two isolates with different morphologies were identified as the same organism.The most resistant organism was reported. FOR PSEUDOMONAS AERUGINOSA ABUNDANT STAPHYLOCOCCUS LUGDUNENSIS SUSCEPTIBILITIES TO FOLLOW Performed at [facility], [address], [place], [place] [address]    Report Status PENDING  Incomplete   Organism ID, Bacteria PSEUDOMONAS AERUGINOSA  Final   Organism ID, Bacteria SERRATIA LIQUEFACIENS  Final      Susceptibility   Pseudomonas aeruginosa - MIC*    CEFTAZIDIME <=1 SENSITIVE Sensitive     CIPROFLOXACIN <=0.25 SENSITIVE Sensitive     GENTAMICIN 2 SENSITIVE Sensitive     IMIPENEM 1 SENSITIVE Sensitive     PIP/TAZO <=4 SENSITIVE Sensitive ug/mL    CEFEPIME  2 SENSITIVE Sensitive     * ABUNDANT PSEUDOMONAS AERUGINOSA   Serratia liquefaciens - MIC*    CEFEPIME  <=0.12 SENSITIVE Sensitive     CEFTAZIDIME <=1 SENSITIVE Sensitive     CEFTRIAXONE  <=0.25 SENSITIVE Sensitive     CIPROFLOXACIN <=0.25 SENSITIVE Sensitive     GENTAMICIN <=1 SENSITIVE Sensitive     IMIPENEM 1 SENSITIVE Sensitive     TRIMETH/SULFA <=20 SENSITIVE Sensitive     PIP/TAZO <=4 SENSITIVE Sensitive ug/mL    * ABUNDANT SERRATIA LIQUEFACIENS    IMAGING: CT RENAL STONE STUDY Result Date: 06/22/2023 EXAM: CT UROGRAM 06/22/2023 02:48:55 AM TECHNIQUE: CT of the abdomen and pelvis was performed before and after the administration of intravenous contrast as per CT urogram protocol. Multiplanar reformatted images as well as MIP urogram images are provided for review. Automated exposure control, iterative reconstruction, and/or weight based adjustment of the mA/kV was utilized to reduce the radiation dose to as low as reasonably achievable. COMPARISON: None available. CLINICAL HISTORY: Abdominal/flank pain, stone suspected. 83 y.o. female with medical history significant of COPD, HTN, HLD, dCHF, hypothyroidism, CKD 3B, anemia, former  smoker, who presents with cough and SOB. Patient states that she has SOB and cough with greenish colored sputum production in the past 3 days. No chest pain, fever or chills. The shortness breath has been progressively worsening. Patient had 1 loose stool bowel movement this morning, currently no nausea, vomiting, abdominal pain or diarrhea. Patient reports dysuria, burning urination and left flank pain for several days. FINDINGS: LOWER CHEST: Multifocal patchy opacities, right lower lobe  predominant, suspicious for pneumonia. LIVER: The liver is unremarkable. GALLBLADDER AND BILE DUCTS: Distended gallbladder without cholelithiasis. No biliary ductal dilatation. SPLEEN: No acute abnormality. PANCREAS: No acute abnormality. ADRENAL GLANDS: No acute abnormality. KIDNEYS, URETERS AND BLADDER: No stones in the kidneys or ureters. No hydronephrosis. No perinephric or periureteral stranding. Urinary bladder is unremarkable. GI AND BOWEL: Tiny hiatal hernia. Normal appendix (image 57). There is no bowel obstruction. PERITONEUM AND RETROPERITONEUM: No ascites. No free air. VASCULATURE: Atherosclerotic calcifications of the abdominal aorta and branch vessels. LYMPH NODES: No lymphadenopathy. REPRODUCTIVE ORGANS: No acute abnormality. BONES AND SOFT TISSUES: Left hip arthroplasty without evidence of complication. Mild degenerative changes of the lower lumbar spine. Grade 1 spondylolisthesis of L4 on L5. Grade 1 anterolisthesis of L5 on S1. No focal soft tissue abnormality. IMPRESSION: 1. No acute findings in the abdomen/pelvis. 2. Multifocal patchy opacities, right lower lobe predominant, suspicious for pneumonia. Electronically signed by: Pinkie Pebbles MD 06/22/2023 02:53 AM EDT RP Workstation: [id number]   DG Chest Port 1 View Result Date: 06/21/2023 EXAM: 1 VIEW XRAY OF THE CHEST 06/21/2023 08:03:00 PM COMPARISON: CT chest dated 02/09/2004. CLINICAL HISTORY: Questionable sepsis - evaluate for abnormality.  Questionable sepsis - eval for abnormality. Patient with exertional SOB x 3 days and pain with urination. Hx of COPD, HTN, and pneumonia. FINDINGS: LUNGS AND PLEURA: Small bilateral pleural effusions. Associated bilateral lower lobe opacities, atelectasis versus pneumonia. HEART AND MEDIASTINUM: No acute abnormality of the cardiac and mediastinal silhouettes. BONES AND SOFT TISSUES: No acute osseous abnormality. IMPRESSION: 1. Small bilateral pleural effusions. 2. Bilateral lower lobe opacities, atelectasis versus pneumonia. Electronically signed by: Pinkie Pebbles MD 06/21/2023 08:06 PM EDT RP Workstation: [id number]    Assessment:   Eileen Wright is a 83 y.o. female with multiple med issues admitted with cough, dysuria, elevated lactate, elevated wbc. UA +, CT renal neg for pyelo but with RLL PNA. 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  Recommendations Pneumonia- extensive esp R sided based on CT abd. Reports thick purulent sputum which has persisted. I suspect aspiration based on mixed cultures and CT and clinical history. Sputum CX Serratia, Pseudomonas and staph lugdenesis. Persistently elevated wbc.  She also reports hx of recurrent cough and sputum off and on so suspect possible underlying bronchiectasis Cont cefepime , doxy. Await sensis on Staph lugdenesis.  Added flagyl for anaerobes  Consider CT chest without contrast if remains elevated.wbc If improving can consider change to orals to complete course once ready for dc  Pantoea agglomerans bacteremia 1/2- clinically not septic and currently covered and would not need further work up as low likelihood to cause metastatic disease or endocarditis  UTI - cx mixed, UA +. CT with no evidence pyelo. Should be adequately covered.  Thank you very much for allowing me to participate in the care of this patient. Please call with questions.   Alm SQUIBB. Epifanio, MD

## 2023-06-26 NOTE — Plan of Care (Signed)

## 2023-06-26 NOTE — Progress Notes (Signed)
 Mobility Specialist - Progress Note   06/26/23 1700  Mobility  Activity Stood at bedside;Transferred from chair to bed  Level of Assistance Standby assist, set-up cues, supervision of patient - no hands on  Assistive Device Front wheel walker  Distance Ambulated (ft) 3 ft  Activity Response Tolerated well  Mobility visit 1 Mobility     Pt transferred chair-bed with minG. Assist on LE to return supine. Pt left in bed with alarm set, needs in reach.    Lennette Seip Mobility Specialist 06/26/23, 5:02 PM

## 2023-06-26 NOTE — TOC Progression Note (Signed)
 Transition of Care [facility]) - Progression Note    Patient Details  Name: Eileen Wright MRN: [id number] Date of Birth: [birth year]  Transition of Care [facility] CM/SW Contact  Quintella Suzen Jansky, RN Phone Number: 06/26/2023, 3:25 PM  Clinical Narrative:      Met with patient at bedside for follow up regarding home health services. She stated she has not decided. TOC will follow up when patient is closer to discharge.  Expected Discharge Plan: Home w Home Health Services Barriers to Discharge: Continued Medical Work up  Expected Discharge Plan and Services     Post Acute Care Choice: Home Health Living arrangements for the past 2 months: Single Family Home                   DME Agency: NA                   Social Determinants of Health (SDOH) Interventions SDOH Screenings   Food Insecurity: No Food Insecurity (06/22/2023)  Housing: Low Risk  (06/22/2023)  Transportation Needs: No Transportation Needs (06/22/2023)  Utilities: Not At Risk (06/22/2023)  Social Connections: Patient Declined (06/22/2023)  Tobacco Use: Medium Risk (06/21/2023)    Readmission Risk Interventions     No data to display

## 2023-06-26 NOTE — Progress Notes (Signed)
 Occupational Therapy Treatment Patient Details Name: Eileen Wright MRN: [id number] DOB: June 20, 1940 Today's Date: 06/26/2023   History of present illness Pt is an 83 yo female that presented the ED for SOB, sputum.  workup for pneumonia and acute pyelonephritis, potential UTI. PMH of COPD, HTN, HLD, dCHF, hypothyroidism, CKD 3B, anemia, former smoker.   OT comments  Pt seen for OT treatment on this date. Upon arrival to room pt semi supine in bed, agreeable to tx. During session with supervision assistance pt completed bed mobility with use of bed rail, STS from the EOB with a RW, amb around bed with RW ~70ft to retire in the recliner. Pt declined further ADLs and mobility on this date due to feeling tired. Pt educated on the benefits of mobility and encouraged to exercise her lungs with the incentive spirometer during tv commercials. Pt left with all needs in reach. Pt making good progress toward goals, will continue to follow POC. Discharge recommendation remains appropriate.        If plan is discharge home, recommend the following:  A little help with walking and/or transfers;A little help with bathing/dressing/bathroom;Help with stairs or ramp for entrance   Equipment Recommendations  BSC/3in1    Recommendations for Other Services      Precautions / Restrictions Precautions Precautions: Fall Recall of Precautions/Restrictions: Intact Restrictions Weight Bearing Restrictions Per Provider Order: No       Mobility Bed Mobility Overal bed mobility: Needs Assistance Bed Mobility: Supine to Sit       Sit to supine: Supervision   General bed mobility comments: No physical assistance to complete    Transfers Overall transfer level: Needs assistance Equipment used: Rolling walker (2 wheels) Transfers: Sit to/from Stand Sit to Stand: Supervision           General transfer comment: Amb around the bed to rest in the recliner with use of RW + supervision     Balance  Overall balance assessment: Needs assistance Sitting-balance support: Feet supported Sitting balance-Leahy Scale: Good     Standing balance support: During functional activity, Single extremity supported Standing balance-Leahy Scale: Fair Standing balance comment: Steady dynamic reaching within BOS                           ADL either performed or assessed with clinical judgement   ADL Overall ADL's : Needs assistance/impaired                         Toilet Transfer: Ambulation;Rolling walker (2 wheels);Supervision/safety Toilet Transfer Details (indicate cue type and reason): Simulated toilet t/f         Functional mobility during ADLs: Rolling walker (2 wheels);Contact guard assist General ADL Comments: Declined further ADL completion    Extremity/Trunk Assessment              Vision       Perception     Praxis     Communication Communication Communication: Impaired Factors Affecting Communication: Hearing impaired   Cognition Arousal: Alert Behavior During Therapy: WFL for tasks assessed/performed Cognition: No apparent impairments             OT - Cognition Comments: A/Ox4                 Following commands: Intact        Cueing   Cueing Techniques: Verbal cues  Exercises Exercises: Other exercises Other Exercises Other Exercises: Edu:  Benefits of OT sessions, fall preventions    Shoulder Instructions       General Comments Assisted with ordering dinner    Pertinent Vitals/ Pain       Pain Assessment Pain Assessment: No/denies pain  Home Living                                          Prior Functioning/Environment              Frequency  Min 3X/week        Progress Toward Goals  OT Goals(current goals can now be found in the care plan section)  Progress towards OT goals: Progressing toward goals  Acute Rehab OT Goals OT Goal Formulation: With patient Time For Goal  Achievement: 07/07/23 Potential to Achieve Goals: Good ADL Goals Pt Will Perform Grooming: with modified independence;standing Pt Will Perform Lower Body Dressing: with modified independence;sit to/from stand Pt Will Transfer to Toilet: with modified independence;ambulating;regular height toilet  Plan      Co-evaluation                 AM-PAC OT 6 Clicks Daily Activity     Outcome Measure   Help from another person eating meals?: None Help from another person taking care of personal grooming?: A Little Help from another person toileting, which includes using toliet, bedpan, or urinal?: A Little Help from another person bathing (including washing, rinsing, drying)?: A Little Help from another person to put on and taking off regular upper body clothing?: A Little Help from another person to put on and taking off regular lower body clothing?: A Lot 6 Click Score: 18    End of Session Equipment Utilized During Treatment: Gait belt;Rolling walker (2 wheels)  OT Visit Diagnosis: Other abnormalities of gait and mobility (R26.89);Muscle weakness (generalized) (M62.81)   Activity Tolerance Patient tolerated treatment well   Patient Left in chair;with call bell/phone within reach;with chair alarm set   Nurse Communication Mobility status        Time: 8457-8443 OT Time Calculation (min): 14 min  Charges: OT General Charges $OT Visit: 1 Visit OT Treatments $Self Care/Home Management : 8-22 mins  Larraine Colas M.S. OTR/L  06/26/23, 4:57 PM

## 2023-06-26 NOTE — Care Management Important Message (Signed)
 Important Message  Patient Details  Name: Eileen Wright MRN: [id number] Date of Birth: 06-10-1940   Important Message Given:  Yes - Medicare IM     Lilliam Chamblee W, CMA 06/26/2023, 3:04 PM

## 2023-06-26 NOTE — Progress Notes (Addendum)
 PROGRESS NOTE  Eileen Wright  FMW:[id number] DOB: 1940-10-07 DOA: 06/21/2023 PCP: Joshua Ronal BRAVO, MD  Consultants  Brief Narrative: 83 y.o. female with a PMH significant for significant of COPD, HTN, HLD, dCHF, hypothyroidism, CKD 3B, anemia, former smoker, who presents with cough with greenish sputum production and SOB for 3 days. Patient also reports dysuria and burning micturition with left flank pain.  [facility] to [facility] and started on antibiotics for pneumonia.  -  Strep pneumo negative.  Preliminary blood cultures with Enterobacterales-->Pantoea agglomerans.  Urine cultures with multiple species.  Sputum cultures with Pseudomonas and Serratia liquefaciens.    Assessment & Plan:  Sepsis due to pneumonia [facility]) - Patient continues to feel subjectively better.  Strength and breathing both improving. Still with productive cough.   - Continue with cefepime /doxycycline  combination as we await final speciation for Staph lugdenesis.  Consulted ID today due to persistently elevated WBC and to ensure we have her on correct abx regimen.   - Persistent leukocytosis noted.  CT scan negative for any intra-abdominal abscess - Repeat WBC.  If elevated, will repeat CT chest.  - Of note, she spent time in her chair yesterday afternoon.      Acute pyelonephritis Patient has a positive urinalysis, typical symptoms for urinary infection, with left flank pain, indicating acute pyelonephritis.  CT renal stone study was without any acute abnormality. -Urine cultures with multiple species -Continue with current antibiotics, cefepime  should cover urine bacteria.  Acute renal failure superimposed on stage 3b chronic kidney disease (HCC) Likely secondary to concern of sepsis and continuation of diuretics. - Creatinine downtrending to 1.31 from 3.37 on admission.  Unclear baseline creatinine, going back to 2022 send like this is within her normal range - Will trend -Keep holding home diuretics and  Zestoretic   COPD (chronic obstructive pulmonary disease) (HCC) No wheezing. - Continue with as needed bronchodilators and supportive care   Chronic diastolic CHF (congestive heart failure) (HCC) 2D echo 02/01/2022 showed EF > 55% with grade 1 diastolic dysfunction, patient has mild fluid overload as discussed above. BNP elevated at 500 with trace lower extremity edema. - Home spironolactone  and torsemide  as was held due to AKI which started improving now. - Keep holding diuretics for now -Monitor volume status closely   Hypothyroidism - Continue home Synthroid    Metabolic acidosis Seems multifactorial with CKD and recent sepsis. - Started on bicarb tablet, improving   Hypertension Blood pressure within lower goal. - Keep holding home Zestoretic, torsemide  and spironolactone  -Continuing metoprolol , decrease dose to 12.5 mg in light of relative hypotension since admission -As needed hydralazine   HLD (hyperlipidemia) - Continue the home Lipitor   Anemia in chronic kidney disease (CKD) - slow downtrend but ultimately stable when compared to past several months/years - Monitor hemoglobin -Transfuse if below 7   Hyperkalemia Mild hyperkalemia in the beginning likely due to hemolyzed sample as it was resolved on subsequent check - Continue to monitor   Overweight (BMI 25.0-29.9) Body weight 66.7 kg, BMI 26.90 - Encourage losing weight - Exercise healthy diet   DVT prophylaxis:  heparin  injection 5,000 Units Start: 06/21/23 2300  Code Status:   Code Status: Full Code Level of care: Telemetry Medical Status is: Inpatient Disposition: Awaiting speciation for blood cultures.  Consults called: None  Subjective: Patient sitting up in bed eating breakfast on my exam.  Awake and alert.  No concerns or complaints, cough improved but persists and still occasionally productive.  No pain.  Reports breathing  to be at baseline  Objective: Vitals:   06/25/23 2016 06/26/23 0451  06/26/23 0553 06/26/23 0807  BP: (!) 122/59 (!) 97/40  (!) (P) 120/51  Pulse: 93 81  89  Resp: 18 18  20   Temp: (!) 97.1 F (36.2 C) (!) 97.5 F (36.4 C)  98.2 F (36.8 C)  TempSrc:    Oral  SpO2: 99% 90%  97%  Weight:   68.7 kg   Height:       No intake or output data in the 24 hours ending 06/26/23 1408  Filed Weights   06/24/23 0500 06/25/23 0500 06/26/23 0553  Weight: 68.1 kg 68.6 kg 68.7 kg   Body mass index is 27.7 kg/m.  Gen: 83 y.o. female in no apparent distress.  Nontoxic Pulm: Non-labored breathing, distant breath sounds CV: Regular rate and rhythm.  GI: Abdomen soft, non-tender, non-distended Ext: Warm, no deformities, no pedal edema Skin: No rashes, lesions  Neuro: Alert and oriented. No focal neurological deficits. Psych: Calm  Judgement and insight appear normal. Mood & affect appropriate.     I have personally reviewed the following labs and images: CBC: Recent Labs  Lab 06/21/23 1955 06/22/23 0556 06/23/23 0959 06/24/23 0515 06/25/23 0601 06/26/23 0205  WBC 20.9* 24.0* 21.2* 19.4* 21.0* 21.6*  NEUTROABS 15.4*  --   --   --   --   --   HGB 8.7* 10.6* 9.4* 9.0* 9.1* 8.5*  HCT 26.5* 32.8* 28.8* 27.3* 27.5* 26.9*  MCV 92.7 94.0 92.3 91.3 90.8 92.8  PLT 465* 548* 527* 522* 517* 484*   BMP &GFR Recent Labs  Lab 06/22/23 0556 06/23/23 0959 06/24/23 0515 06/25/23 0601 06/26/23 0205  NA 137 139 139 140 141  K 4.5 3.8 3.9 3.8 3.6  CL 112* 116* 116* 115* 116*  CO2 15* 16* 18* 18* 22  GLUCOSE 69* 114* 97 96 98  BUN 98* 76* 64* 47* 39*  CREATININE 2.88* 2.22* 1.73* 1.39* 1.31*  CALCIUM  8.7* 9.4 9.9 10.3 10.2   Estimated Creatinine Clearance: 30.1 mL/min (A) (by C-G formula based on SCr of 1.31 mg/dL (H)). Liver & Pancreas: Recent Labs  Lab 06/21/23 1955  AST 20  ALT 10  ALKPHOS 98  BILITOT 1.0  PROT 7.1  ALBUMIN  2.8*   No results for input(s): LIPASE, AMYLASE in the last 168 hours. No results for input(s): AMMONIA in the last  168 hours. Diabetic: No results for input(s): HGBA1C in the last 72 hours. No results for input(s): GLUCAP in the last 168 hours. Cardiac Enzymes: No results for input(s): CKTOTAL, CKMB, CKMBINDEX, TROPONINI in the last 168 hours. No results for input(s): PROBNP in the last 8760 hours. Coagulation Profile: Recent Labs  Lab 06/21/23 1955  INR 1.2   Thyroid Function Tests: No results for input(s): TSH, T4TOTAL, FREET4, T3FREE, THYROIDAB in the last 72 hours. Lipid Profile: No results for input(s): CHOL, HDL, LDLCALC, TRIG, CHOLHDL, LDLDIRECT in the last 72 hours. Anemia Panel: No results for input(s): VITAMINB12, FOLATE, FERRITIN, TIBC, IRON, RETICCTPCT in the last 72 hours. Urine analysis:    Component Value Date/Time   COLORURINE YELLOW (A) 06/21/2023 1955   APPEARANCEUR CLOUDY (A) 06/21/2023 1955   LABSPEC 1.013 06/21/2023 1955   PHURINE 5.0 06/21/2023 1955   GLUCOSEU NEGATIVE 06/21/2023 1955   HGBUR NEGATIVE 06/21/2023 1955   BILIRUBINUR NEGATIVE 06/21/2023 1955   KETONESUR NEGATIVE 06/21/2023 1955   PROTEINUR 30 (A) 06/21/2023 1955   NITRITE NEGATIVE 06/21/2023 1955   LEUKOCYTESUR LARGE (A)  06/21/2023 1955   Sepsis Labs: Invalid input(s): PROCALCITONIN, LACTICIDVEN  Microbiology: Recent Results (from the past 240 hours)  Blood Culture (routine x 2)     Status: None   Collection Time: 06/21/23  7:55 PM   Specimen: BLOOD  Result Value Ref Range Status   Specimen Description BLOOD BLOOD LEFT ARM  Final   Special Requests   Final    BOTTLES DRAWN AEROBIC AND ANAEROBIC Blood Culture adequate volume   Culture   Final    NO GROWTH 5 DAYS Performed at [facility], [address] [place], [place] [address]    Report Status 06/26/2023 FINAL  Final  Blood Culture (routine x 2)     Status: Abnormal (Preliminary result)   Collection Time: 06/21/23  7:55 PM   Specimen: BLOOD RIGHT ARM  Result Value Ref Range  Status   Specimen Description   Final    BLOOD RIGHT ARM Performed at [facility], [address] [place], [place] [address]    Special Requests   Final    BOTTLES DRAWN AEROBIC AND ANAEROBIC Blood Culture adequate volume Performed at [facility], [address] [place], [place] [address]    Culture  Setup Time   Final    Organism ID to follow GRAM NEGATIVE RODS ANAEROBIC BOTTLE ONLY CRITICAL RESULT CALLED TO, READ BACK BY AND VERIFIED WITH: Raquel Rodriguez guzman @1816  on 06/22/23 skl Performed at [facility], [address], [place], [place] [address]    Culture PANTOEA AGGLOMERANS (A)  Final   Report Status PENDING  Incomplete   Organism ID, Bacteria PANTOEA AGGLOMERANS  Final      Susceptibility   Pantoea agglomerans - MIC*    CEFEPIME  <=0.12 SENSITIVE Sensitive     CEFTAZIDIME <=1 SENSITIVE Sensitive     CEFTRIAXONE  <=0.25 SENSITIVE Sensitive     CIPROFLOXACIN <=0.25 SENSITIVE Sensitive     GENTAMICIN <=1 SENSITIVE Sensitive     IMIPENEM 0.5 SENSITIVE Sensitive     TRIMETH/SULFA <=20 SENSITIVE Sensitive     PIP/TAZO >=128 RESISTANT Resistant ug/mL    * PANTOEA AGGLOMERANS  Urine Culture (for pregnant, neutropenic or urologic patients or patients with an indwelling urinary catheter)     Status: Abnormal   Collection Time: 06/21/23  7:55 PM   Specimen: Urine, Clean Catch  Result Value Ref Range Status   Specimen Description   Final    URINE, CLEAN CATCH Performed at [facility], [address] [place], [place] [address]    Special Requests   Final    NONE Performed at [facility], [address] [place], [place] [address]    Culture MULTIPLE SPECIES PRESENT, SUGGEST RECOLLECTION (A)  Final   Report Status 06/23/2023 FINAL  Final  Blood Culture ID Panel (Reflexed)     Status: Abnormal   Collection Time: 06/21/23  7:55 PM  Result Value Ref Range Status   Enterococcus faecalis NOT DETECTED NOT DETECTED Final    Enterococcus Faecium NOT DETECTED NOT DETECTED Final   Listeria monocytogenes NOT DETECTED NOT DETECTED Final   Staphylococcus species NOT DETECTED NOT DETECTED Final   Staphylococcus aureus (BCID) NOT DETECTED NOT DETECTED Final   Staphylococcus epidermidis NOT DETECTED NOT DETECTED Final   Staphylococcus lugdunensis NOT DETECTED NOT DETECTED Final   Streptococcus species NOT DETECTED NOT DETECTED Final   Streptococcus agalactiae NOT DETECTED NOT DETECTED Final   Streptococcus pneumoniae NOT DETECTED NOT DETECTED Final   Streptococcus pyogenes NOT DETECTED NOT  DETECTED Final   A.calcoaceticus-baumannii NOT DETECTED NOT DETECTED Final   Bacteroides fragilis NOT DETECTED NOT DETECTED Final   Enterobacterales DETECTED (A) NOT DETECTED Final    Comment: Enterobacterales represent a large order of gram negative bacteria, not a single organism. Refer to culture for further identification. CRITICAL RESULT CALLED TO, READ BACK BY AND VERIFIED WITH: Raquel Evern guzman @1816  on 06/22/23 skl    Enterobacter cloacae complex NOT DETECTED NOT DETECTED Final   Escherichia coli NOT DETECTED NOT DETECTED Final   Klebsiella aerogenes NOT DETECTED NOT DETECTED Final   Klebsiella oxytoca NOT DETECTED NOT DETECTED Final   Klebsiella pneumoniae NOT DETECTED NOT DETECTED Final   Proteus species NOT DETECTED NOT DETECTED Final   Salmonella species NOT DETECTED NOT DETECTED Final   Serratia marcescens NOT DETECTED NOT DETECTED Final   Haemophilus influenzae NOT DETECTED NOT DETECTED Final   Neisseria meningitidis NOT DETECTED NOT DETECTED Final   Pseudomonas aeruginosa NOT DETECTED NOT DETECTED Final   Stenotrophomonas maltophilia NOT DETECTED NOT DETECTED Final   Candida albicans NOT DETECTED NOT DETECTED Final   Candida auris NOT DETECTED NOT DETECTED Final   Candida glabrata NOT DETECTED NOT DETECTED Final   Candida krusei NOT DETECTED NOT DETECTED Final   Candida parapsilosis NOT DETECTED NOT  DETECTED Final   Candida tropicalis NOT DETECTED NOT DETECTED Final   Cryptococcus neoformans/gattii NOT DETECTED NOT DETECTED Final   CTX-M ESBL NOT DETECTED NOT DETECTED Final   Carbapenem resistance IMP NOT DETECTED NOT DETECTED Final   Carbapenem resistance KPC NOT DETECTED NOT DETECTED Final   Carbapenem resistance NDM NOT DETECTED NOT DETECTED Final   Carbapenem resist OXA 48 LIKE NOT DETECTED NOT DETECTED Final   Carbapenem resistance VIM NOT DETECTED NOT DETECTED Final    Comment: Performed at [facility], [address] [place], [place] [address]  Expectorated Sputum Assessment w Gram Stain, Rflx to Resp Cult     Status: None   Collection Time: 06/22/23 12:20 AM   Specimen: Urine, Clean Catch; Sputum  Result Value Ref Range Status   Specimen Description SPU  Final   Special Requests NONE  Final   Sputum evaluation   Final    THIS SPECIMEN IS ACCEPTABLE FOR SPUTUM CULTURE Performed at [facility], [address] [place], [place] [address]    Report Status 06/22/2023 FINAL  Final  Resp panel by RT-PCR (RSV, Flu A&B, Covid) Urine, Clean Catch     Status: None   Collection Time: 06/22/23 12:20 AM   Specimen: Urine, Clean Catch; Nasal Swab  Result Value Ref Range Status   SARS Coronavirus 2 by RT PCR NEGATIVE NEGATIVE Final    Comment: (NOTE) SARS-CoV-2 target nucleic acids are NOT DETECTED.  The SARS-CoV-2 RNA is generally detectable in upper respiratory specimens during the acute phase of infection. The lowest concentration of SARS-CoV-2 viral copies this assay can detect is 138 copies/mL. A negative result does not preclude SARS-Cov-2 infection and should not be used as the sole basis for treatment or other patient management decisions. A negative result may occur with  improper specimen collection/handling, submission of specimen other than nasopharyngeal swab, presence of viral mutation(s) within the areas targeted by this assay, and inadequate  number of viral copies(<138 copies/mL). A negative result must be combined with clinical observations, patient history, and epidemiological information. The expected result is Negative.  Fact Sheet for Patients:  BloggerCourse.com  Fact Sheet for Healthcare Providers:  SeriousBroker.it  This test is no t  yet approved or cleared by the [place]  FDA and  has been authorized for detection and/or diagnosis of SARS-CoV-2 by FDA under an Emergency Use Authorization (EUA). This EUA will remain  in effect (meaning this test can be used) for the duration of the COVID-19 declaration under Section 564(b)(1) of the Act, 21 U.S.C.section 360bbb-3(b)(1), unless the authorization is terminated  or revoked sooner.       Influenza A by PCR NEGATIVE NEGATIVE Final   Influenza B by PCR NEGATIVE NEGATIVE Final    Comment: (NOTE) The Xpert Xpress SARS-CoV-2/FLU/RSV plus assay is intended as an aid in the diagnosis of influenza from Nasopharyngeal swab specimens and should not be used as a sole basis for treatment. Nasal washings and aspirates are unacceptable for Xpert Xpress SARS-CoV-2/FLU/RSV testing.  Fact Sheet for Patients: BloggerCourse.com  Fact Sheet for Healthcare Providers: SeriousBroker.it  This test is not yet approved or cleared by the [place]  FDA and has been authorized for detection and/or diagnosis of SARS-CoV-2 by FDA under an Emergency Use Authorization (EUA). This EUA will remain in effect (meaning this test can be used) for the duration of the COVID-19 declaration under Section 564(b)(1) of the Act, 21 U.S.C. section 360bbb-3(b)(1), unless the authorization is terminated or revoked.     Resp Syncytial Virus by PCR NEGATIVE NEGATIVE Final    Comment: (NOTE) Fact Sheet for Patients: BloggerCourse.com  Fact Sheet for Healthcare  Providers: SeriousBroker.it  This test is not yet approved or cleared by the [place]  FDA and has been authorized for detection and/or diagnosis of SARS-CoV-2 by FDA under an Emergency Use Authorization (EUA). This EUA will remain in effect (meaning this test can be used) for the duration of the COVID-19 declaration under Section 564(b)(1) of the Act, 21 U.S.C. section 360bbb-3(b)(1), unless the authorization is terminated or revoked.  Performed at [facility], [address] [place], [place] [address]   Culture, Respiratory w Gram Stain     Status: None (Preliminary result)   Collection Time: 06/22/23 12:20 AM   Specimen: Sputum  Result Value Ref Range Status   Specimen Description   Final    SPU Performed at [facility], [address] [place], [place] [address]    Special Requests   Final    NONE Reflexed from [id number] Performed at [facility], [address] [place], [place] [address]    Gram Stain   Final    ABUNDANT WBC PRESENT, PREDOMINANTLY PMN ABUNDANT SQUAMOUS EPITHELIAL CELLS PRESENT ABUNDANT GRAM POSITIVE COCCI ABUNDANT GRAM POSITIVE RODS MODERATE GRAM NEGATIVE RODS    Culture   Final    ABUNDANT PSEUDOMONAS AERUGINOSA ABUNDANT SERRATIA LIQUEFACIENS Two isolates with different morphologies were identified as the same organism.The most resistant organism was reported. FOR PSEUDOMONAS AERUGINOSA ABUNDANT STAPHYLOCOCCUS LUGDUNENSIS SUSCEPTIBILITIES TO FOLLOW Performed at [facility], [address] [place], [place] [address]    Report Status PENDING  Incomplete   Organism ID, Bacteria PSEUDOMONAS AERUGINOSA  Final   Organism ID, Bacteria SERRATIA LIQUEFACIENS  Final      Susceptibility   Pseudomonas aeruginosa - MIC*    CEFTAZIDIME <=1 SENSITIVE Sensitive     CIPROFLOXACIN <=0.25 SENSITIVE Sensitive     GENTAMICIN 2 SENSITIVE Sensitive     IMIPENEM 1 SENSITIVE Sensitive     PIP/TAZO <=4  SENSITIVE Sensitive ug/mL    CEFEPIME  2 SENSITIVE Sensitive     * ABUNDANT PSEUDOMONAS AERUGINOSA   Serratia liquefaciens - MIC*    CEFEPIME  <=  0.12 SENSITIVE Sensitive     CEFTAZIDIME <=1 SENSITIVE Sensitive     CEFTRIAXONE  <=0.25 SENSITIVE Sensitive     CIPROFLOXACIN <=0.25 SENSITIVE Sensitive     GENTAMICIN <=1 SENSITIVE Sensitive     IMIPENEM 1 SENSITIVE Sensitive     TRIMETH/SULFA <=20 SENSITIVE Sensitive     PIP/TAZO <=4 SENSITIVE Sensitive ug/mL    * ABUNDANT SERRATIA LIQUEFACIENS    Radiology Studies: No results found.  Scheduled Meds:  atorvastatin   10 mg Oral QHS   budesonide -glycopyrrolate -formoterol   2 puff Inhalation BID   cholecalciferol   5,000 Units Oral Daily   heparin   5,000 Units Subcutaneous Q8H   levothyroxine   100 mcg Oral Q0600   loratadine   10 mg Oral Daily   metoprolol  succinate  12.5 mg Oral Daily   metroNIDAZOLE  500 mg Oral Q12H   pantoprazole   40 mg Oral Daily   sodium bicarbonate   1,300 mg Oral BID   Continuous Infusions:  ceFEPime  (MAXIPIME ) IV     doxycycline  (VIBRAMYCIN ) IV 100 mg (06/26/23 1026)     LOS: 4 days   35 minutes with more than 50% spent in reviewing records, counseling patient/family and coordinating care.  Reyes VEAR Gaw, MD [facility] [email or web address] 06/26/2023, 2:08 PM

## 2023-06-26 NOTE — Progress Notes (Signed)
 PHARMACY NOTE:  ANTIMICROBIAL RENAL DOSAGE ADJUSTMENT  Current antimicrobial regimen includes a mismatch between antimicrobial dosage and estimated renal function.  As per policy approved by the Pharmacy & Therapeutics and Medical Executive Committees, the antimicrobial dosage will be adjusted accordingly.  Current antimicrobial dosage:  cefepime  2 gm IV q24h  Indication: PNA, pyelonephritis  Renal Function:  Estimated Creatinine Clearance: 30.1 mL/min (A) (by C-G formula based on SCr of 1.31 mg/dL (H)).     Antimicrobial dosage has been changed to:  cefepime  2 gm IV q12h  Additional comments:   Thank you for allowing pharmacy to be a part of this patient's care.  Allean Haas PharmD Clinical Pharmacist 06/26/2023

## 2023-06-27 ENCOUNTER — Inpatient Hospital Stay

## 2023-06-27 DIAGNOSIS — A419 Sepsis, unspecified organism: Secondary | ICD-10-CM | POA: Diagnosis not present

## 2023-06-27 DIAGNOSIS — J189 Pneumonia, unspecified organism: Secondary | ICD-10-CM | POA: Diagnosis not present

## 2023-06-27 LAB — BASIC METABOLIC PANEL WITH GFR
Anion gap: 4 — ABNORMAL LOW (ref 5–15)
BUN: 34 mg/dL — ABNORMAL HIGH (ref 8–23)
CO2: 22 mmol/L (ref 22–32)
Calcium: 10 mg/dL (ref 8.9–10.3)
Chloride: 115 mmol/L — ABNORMAL HIGH (ref 98–111)
Creatinine, Ser: 1.16 mg/dL — ABNORMAL HIGH (ref 0.44–1.00)
GFR, Estimated: 47 mL/min — ABNORMAL LOW (ref >60)
Glucose, Bld: 87 mg/dL (ref 70–99)
Potassium: 3.7 mmol/L (ref 3.5–5.1)
Sodium: 141 mmol/L (ref 135–145)

## 2023-06-27 LAB — CBC
HCT: 27.9 % — ABNORMAL LOW (ref 36.0–46.0)
Hemoglobin: 8.9 g/dL — ABNORMAL LOW (ref 12.0–15.0)
MCH: 29.7 pg (ref 26.0–34.0)
MCHC: 31.9 g/dL (ref 30.0–36.0)
MCV: 93 fL (ref 80.0–100.0)
Platelets: 461 10*3/uL — ABNORMAL HIGH (ref 150–400)
RBC: 3 MIL/uL — ABNORMAL LOW (ref 3.87–5.11)
RDW: 15.2 % (ref 11.5–15.5)
WBC: 23.2 10*3/uL — ABNORMAL HIGH (ref 4.0–10.5)
nRBC: 0 % (ref 0.0–0.2)

## 2023-06-27 NOTE — Plan of Care (Signed)
   Problem: Education: Goal: Knowledge of General Education information will improve Description Including pain rating scale, medication(s)/side effects and non-pharmacologic comfort measures Outcome: Progressing

## 2023-06-27 NOTE — Progress Notes (Signed)
 PROGRESS NOTE  EXA BOMBA  FMW:[id number] DOB: 10/21/1940 DOA: 06/21/2023 PCP: Joshua Ronal BRAVO, MD  Consultants  Brief Narrative: 83 y.o. female with a PMH significant for significant of COPD, HTN, HLD, dCHF, hypothyroidism, CKD 3B, anemia, former smoker, who presents with cough with greenish sputum production and SOB for 3 days. Patient also reports dysuria and burning micturition with left flank pain.  [facility] and started on antibiotics for pneumonia.  -  Strep pneumo negative.  Preliminary blood cultures with Enterobacterales-->Pantoea agglomerans.  Urine cultures with multiple species.  Sputum cultures with Pseudomonas and Serratia liquefaciens.    Assessment & Plan:  Sepsis due to pneumonia [facility]) - Patient continues to feel subjectively better.  Strength and breathing both improving, though not yet to baseline. Still with productive cough.   - Continue with cefepime /doxycycline  combination as we await final speciation for Staph lugdenesis.   - Appreciate ID input.  Added Flagyl yesterday for anaerobic coverage.   - Due to persistent purulent cough today repeated CT chest.  Read still pending. - Continue IV antibiotics for now as we await CT read   Acute pyelonephritis Patient has a positive urinalysis, typical symptoms for urinary infection, with left flank pain, indicating acute pyelonephritis.  CT renal stone study was without any acute abnormality. -Urine cultures with multiple species -Continue with current antibiotics, cefepime  should cover urine bacteria.  Acute renal failure superimposed on stage 3b chronic kidney disease (HCC) Likely secondary to concern of sepsis and continuation of diuretics. - Creatinine continues to downtrend.  This is actually the last creatinine we have on record for her.  We are continuing to  hold her home diuretics.  She has no evidence of fluid overload on exam.  Will need to closely follow in light of holding her diuretics    COPD (chronic obstructive pulmonary disease) (HCC) No wheezing. - Continue with as needed bronchodilators and supportive care   Chronic diastolic CHF (congestive heart failure) (HCC) 2D echo 02/01/2022 showed EF > 55% with grade 1 diastolic dysfunction, patient has mild fluid overload as discussed above. BNP elevated at 500 with trace lower extremity edema. - Home spironolactone  and torsemide  as was held due to AKI which started improving now. - Keep holding diuretics for now -Monitor volume status closely   Hypothyroidism - Continue home Synthroid    Metabolic acidosis Seems multifactorial with CKD and recent sepsis. - Started on bicarb tablet, now resolved.   - Secondary to chronic kidney disease.   Hypertension - Blood pressure has been good here despite holding home Zestoretic, torsemide  and spironolactone  -Continuing metoprolol , decrease dose to 12.5 mg in light of relative hypotension since admission--> adjust back to home dose if blood pressure continues to rise. -As needed hydralazine on board.   HLD (hyperlipidemia) - Continue the home Lipitor   Anemia in chronic kidney disease (CKD) - slow downtrend but ultimately stable when compared to past several months/years - Monitor hemoglobin -Transfuse if below 7   Hyperkalemia Mild hyperkalemia in the beginning likely due to hemolyzed sample as it was resolved on subsequent check - Continue to monitor   Overweight (BMI 25.0-29.9) Body weight 66.7 kg, BMI 26.90 - Encourage losing weight - Exercise healthy diet   DVT prophylaxis:  heparin  injection 5,000 Units Start: 06/21/23 2300  Code Status:   Code Status: Full Code Level of care: Telemetry Medical Status is: Inpatient Disposition: Awaiting speciation for blood cultures.  Consults called: None  Subjective: Patient sitting up in bed eating lunch  on my exam.  Awake and alert.  Still with cough.  She does report it is a little less yesterday but still productive.   No chest pain or concerns or complaints Objective: Vitals:   06/26/23 1540 06/26/23 1931 06/27/23 0424 06/27/23 0500  BP: 121/68 131/66 (!) 124/59   Pulse: 91 92 92   Resp: 16 18 18    Temp: 98.3 F (36.8 C) 98.6 F (37 C) 98.2 F (36.8 C)   TempSrc: Oral Oral Oral   SpO2: 100% 99% 97%   Weight:    68.9 kg  Height:        Intake/Output Summary (Last 24 hours) at 06/27/2023 1453 Last data filed at 06/27/2023 0900 Gross per 24 hour  Intake 0 ml  Output --  Net 0 ml    Filed Weights   06/25/23 0500 06/26/23 0553 06/27/23 0500  Weight: 68.6 kg 68.7 kg 68.9 kg   Body mass index is 27.78 kg/m.  Gen: 83 y.o. female in no apparent distress.  Nontoxic Pulm: Non-labored breathing, distant breath sounds, some rhonchi at bases.  No crackles CV: Regular rate and rhythm.  GI: Abdomen soft, non-tender, non-distended Ext: Warm, no deformities, no pedal edema Skin: No rashes, lesions  Neuro: Alert and oriented. No focal neurological deficits. Psych: Calm  Judgement and insight appear normal. Mood & affect appropriate.     I have personally reviewed the following labs and images: CBC: Recent Labs  Lab 06/21/23 1955 06/22/23 0556 06/23/23 0959 06/24/23 0515 06/25/23 0601 06/26/23 0205 06/27/23 0517  WBC 20.9*   < > 21.2* 19.4* 21.0* 21.6* 23.2*  NEUTROABS 15.4*  --   --   --   --   --   --   HGB 8.7*   < > 9.4* 9.0* 9.1* 8.5* 8.9*  HCT 26.5*   < > 28.8* 27.3* 27.5* 26.9* 27.9*  MCV 92.7   < > 92.3 91.3 90.8 92.8 93.0  PLT 465*   < > 527* 522* 517* 484* 461*   < > = values in this interval not displayed.   BMP &GFR Recent Labs  Lab 06/23/23 0959 06/24/23 0515 06/25/23 0601 06/26/23 0205 06/27/23 0517  NA 139 139 140 141 141  K 3.8 3.9 3.8 3.6 3.7  CL 116* 116* 115* 116* 115*  CO2 16* 18* 18* 22 22  GLUCOSE 114* 97 96 98 87  BUN 76* 64* 47* 39* 34*  CREATININE 2.22* 1.73* 1.39* 1.31* 1.16*  CALCIUM  9.4 9.9 10.3 10.2 10.0   Estimated Creatinine Clearance: 34  mL/min (A) (by C-G formula based on SCr of 1.16 mg/dL (H)). Liver & Pancreas: Recent Labs  Lab 06/21/23 1955  AST 20  ALT 10  ALKPHOS 98  BILITOT 1.0  PROT 7.1  ALBUMIN  2.8*   No results for input(s): LIPASE, AMYLASE in the last 168 hours. No results for input(s): AMMONIA in the last 168 hours. Diabetic: No results for input(s): HGBA1C in the last 72 hours. No results for input(s): GLUCAP in the last 168 hours. Cardiac Enzymes: No results for input(s): CKTOTAL, CKMB, CKMBINDEX, TROPONINI in the last 168 hours. No results for input(s): PROBNP in the last 8760 hours. Coagulation Profile: Recent Labs  Lab 06/21/23 1955  INR 1.2   Thyroid Function Tests: No results for input(s): TSH, T4TOTAL, FREET4, T3FREE, THYROIDAB in the last 72 hours. Lipid Profile: No results for input(s): CHOL, HDL, LDLCALC, TRIG, CHOLHDL, LDLDIRECT in the last 72 hours. Anemia Panel: No results for input(s): VITAMINB12, FOLATE,  FERRITIN, TIBC, IRON, RETICCTPCT in the last 72 hours. Urine analysis:    Component Value Date/Time   COLORURINE YELLOW (A) 06/21/2023 1955   APPEARANCEUR CLOUDY (A) 06/21/2023 1955   LABSPEC 1.013 06/21/2023 1955   PHURINE 5.0 06/21/2023 1955   GLUCOSEU NEGATIVE 06/21/2023 1955   HGBUR NEGATIVE 06/21/2023 1955   BILIRUBINUR NEGATIVE 06/21/2023 1955   KETONESUR NEGATIVE 06/21/2023 1955   PROTEINUR 30 (A) 06/21/2023 1955   NITRITE NEGATIVE 06/21/2023 1955   LEUKOCYTESUR LARGE (A) 06/21/2023 1955   Sepsis Labs: Invalid input(s): PROCALCITONIN, LACTICIDVEN  Microbiology: Recent Results (from the past 240 hours)  Blood Culture (routine x 2)     Status: None   Collection Time: 06/21/23  7:55 PM   Specimen: BLOOD  Result Value Ref Range Status   Specimen Description BLOOD BLOOD LEFT ARM  Final   Special Requests   Final    BOTTLES DRAWN AEROBIC AND ANAEROBIC Blood Culture adequate volume   Culture   Final    NO  GROWTH 5 DAYS Performed at [facility], [address] [place], [place] [address]    Report Status 06/26/2023 FINAL  Final  Blood Culture (routine x 2)     Status: Abnormal   Collection Time: 06/21/23  7:55 PM   Specimen: BLOOD RIGHT ARM  Result Value Ref Range Status   Specimen Description   Final    BLOOD RIGHT ARM Performed at [facility], [address], [place], [place] [address]    Special Requests   Final    BOTTLES DRAWN AEROBIC AND ANAEROBIC Blood Culture adequate volume Performed at [facility], [address] [place], [place] [address]    Culture  Setup Time   Final    Organism ID to follow GRAM NEGATIVE RODS ANAEROBIC BOTTLE ONLY CRITICAL RESULT CALLED TO, READ BACK BY AND VERIFIED WITH: Raquel Rodriguez guzman @1816  on 06/22/23 skl Performed at [facility], [address] [place], [place] [address]    Culture PANTOEA AGGLOMERANS (A)  Final   Report Status 06/26/2023 FINAL  Final   Organism ID, Bacteria PANTOEA AGGLOMERANS  Final      Susceptibility   Pantoea agglomerans - MIC*    CEFEPIME  <=0.12 SENSITIVE Sensitive     CEFTAZIDIME <=1 SENSITIVE Sensitive     CEFTRIAXONE  <=0.25 SENSITIVE Sensitive     CIPROFLOXACIN <=0.25 SENSITIVE Sensitive     GENTAMICIN <=1 SENSITIVE Sensitive     IMIPENEM 0.5 SENSITIVE Sensitive     TRIMETH/SULFA <=20 SENSITIVE Sensitive     PIP/TAZO >=128 RESISTANT Resistant ug/mL    * PANTOEA AGGLOMERANS  Urine Culture (for pregnant, neutropenic or urologic patients or patients with an indwelling urinary catheter)     Status: Abnormal   Collection Time: 06/21/23  7:55 PM   Specimen: Urine, Clean Catch  Result Value Ref Range Status   Specimen Description   Final    URINE, CLEAN CATCH Performed at [facility], [address], [place], [place] [address]    Special Requests   Final    NONE Performed at [facility], [address] [place], [place] [address]    Culture  MULTIPLE SPECIES PRESENT, SUGGEST RECOLLECTION (A)  Final   Report Status 06/23/2023 FINAL  Final  Blood Culture ID Panel (Reflexed)     Status: Abnormal   Collection Time: 06/21/23  7:55 PM  Result Value Ref Range Status   Enterococcus faecalis NOT DETECTED NOT DETECTED Final   Enterococcus Faecium NOT  DETECTED NOT DETECTED Final   Listeria monocytogenes NOT DETECTED NOT DETECTED Final   Staphylococcus species NOT DETECTED NOT DETECTED Final   Staphylococcus aureus (BCID) NOT DETECTED NOT DETECTED Final   Staphylococcus epidermidis NOT DETECTED NOT DETECTED Final   Staphylococcus lugdunensis NOT DETECTED NOT DETECTED Final   Streptococcus species NOT DETECTED NOT DETECTED Final   Streptococcus agalactiae NOT DETECTED NOT DETECTED Final   Streptococcus pneumoniae NOT DETECTED NOT DETECTED Final   Streptococcus pyogenes NOT DETECTED NOT DETECTED Final   A.calcoaceticus-baumannii NOT DETECTED NOT DETECTED Final   Bacteroides fragilis NOT DETECTED NOT DETECTED Final   Enterobacterales DETECTED (A) NOT DETECTED Final    Comment: Enterobacterales represent a large order of gram negative bacteria, not a single organism. Refer to culture for further identification. CRITICAL RESULT CALLED TO, READ BACK BY AND VERIFIED WITH: Raquel Evern guzman @1816  on 06/22/23 skl    Enterobacter cloacae complex NOT DETECTED NOT DETECTED Final   Escherichia coli NOT DETECTED NOT DETECTED Final   Klebsiella aerogenes NOT DETECTED NOT DETECTED Final   Klebsiella oxytoca NOT DETECTED NOT DETECTED Final   Klebsiella pneumoniae NOT DETECTED NOT DETECTED Final   Proteus species NOT DETECTED NOT DETECTED Final   Salmonella species NOT DETECTED NOT DETECTED Final   Serratia marcescens NOT DETECTED NOT DETECTED Final   Haemophilus influenzae NOT DETECTED NOT DETECTED Final   Neisseria meningitidis NOT DETECTED NOT DETECTED Final   Pseudomonas aeruginosa NOT DETECTED NOT DETECTED Final   Stenotrophomonas  maltophilia NOT DETECTED NOT DETECTED Final   Candida albicans NOT DETECTED NOT DETECTED Final   Candida auris NOT DETECTED NOT DETECTED Final   Candida glabrata NOT DETECTED NOT DETECTED Final   Candida krusei NOT DETECTED NOT DETECTED Final   Candida parapsilosis NOT DETECTED NOT DETECTED Final   Candida tropicalis NOT DETECTED NOT DETECTED Final   Cryptococcus neoformans/gattii NOT DETECTED NOT DETECTED Final   CTX-M ESBL NOT DETECTED NOT DETECTED Final   Carbapenem resistance IMP NOT DETECTED NOT DETECTED Final   Carbapenem resistance KPC NOT DETECTED NOT DETECTED Final   Carbapenem resistance NDM NOT DETECTED NOT DETECTED Final   Carbapenem resist OXA 48 LIKE NOT DETECTED NOT DETECTED Final   Carbapenem resistance VIM NOT DETECTED NOT DETECTED Final    Comment: Performed at [facility], [address] [place], [place] [address]  Expectorated Sputum Assessment w Gram Stain, Rflx to Resp Cult     Status: None   Collection Time: 06/22/23 12:20 AM   Specimen: Urine, Clean Catch; Sputum  Result Value Ref Range Status   Specimen Description SPU  Final   Special Requests NONE  Final   Sputum evaluation   Final    THIS SPECIMEN IS ACCEPTABLE FOR SPUTUM CULTURE Performed at [facility], [address] [place], [place] [address]    Report Status 06/22/2023 FINAL  Final  Resp panel by RT-PCR (RSV, Flu A&B, Covid) Urine, Clean Catch     Status: None   Collection Time: 06/22/23 12:20 AM   Specimen: Urine, Clean Catch; Nasal Swab  Result Value Ref Range Status   SARS Coronavirus 2 by RT PCR NEGATIVE NEGATIVE Final    Comment: (NOTE) SARS-CoV-2 target nucleic acids are NOT DETECTED.  The SARS-CoV-2 RNA is generally detectable in upper respiratory specimens during the acute phase of infection. The lowest concentration of SARS-CoV-2 viral copies this assay can detect is 138 copies/mL. A negative result does not preclude SARS-Cov-2 infection and should not be used as  the  sole basis for treatment or other patient management decisions. A negative result may occur with  improper specimen collection/handling, submission of specimen other than nasopharyngeal swab, presence of viral mutation(s) within the areas targeted by this assay, and inadequate number of viral copies(<138 copies/mL). A negative result must be combined with clinical observations, patient history, and epidemiological information. The expected result is Negative.  Fact Sheet for Patients:  BloggerCourse.com  Fact Sheet for Healthcare Providers:  SeriousBroker.it  This test is no t yet approved or cleared by the [place]  FDA and  has been authorized for detection and/or diagnosis of SARS-CoV-2 by FDA under an Emergency Use Authorization (EUA). This EUA will remain  in effect (meaning this test can be used) for the duration of the COVID-19 declaration under Section 564(b)(1) of the Act, 21 U.S.C.section 360bbb-3(b)(1), unless the authorization is terminated  or revoked sooner.       Influenza A by PCR NEGATIVE NEGATIVE Final   Influenza B by PCR NEGATIVE NEGATIVE Final    Comment: (NOTE) The Xpert Xpress SARS-CoV-2/FLU/RSV plus assay is intended as an aid in the diagnosis of influenza from Nasopharyngeal swab specimens and should not be used as a sole basis for treatment. Nasal washings and aspirates are unacceptable for Xpert Xpress SARS-CoV-2/FLU/RSV testing.  Fact Sheet for Patients: BloggerCourse.com  Fact Sheet for Healthcare Providers: SeriousBroker.it  This test is not yet approved or cleared by the [place]  FDA and has been authorized for detection and/or diagnosis of SARS-CoV-2 by FDA under an Emergency Use Authorization (EUA). This EUA will remain in effect (meaning this test can be used) for the duration of the COVID-19 declaration under Section 564(b)(1) of  the Act, 21 U.S.C. section 360bbb-3(b)(1), unless the authorization is terminated or revoked.     Resp Syncytial Virus by PCR NEGATIVE NEGATIVE Final    Comment: (NOTE) Fact Sheet for Patients: BloggerCourse.com  Fact Sheet for Healthcare Providers: SeriousBroker.it  This test is not yet approved or cleared by the [place]  FDA and has been authorized for detection and/or diagnosis of SARS-CoV-2 by FDA under an Emergency Use Authorization (EUA). This EUA will remain in effect (meaning this test can be used) for the duration of the COVID-19 declaration under Section 564(b)(1) of the Act, 21 U.S.C. section 360bbb-3(b)(1), unless the authorization is terminated or revoked.  Performed at [facility], [address] [place], [place] [address]   Culture, Respiratory w Gram Stain     Status: None   Collection Time: 06/22/23 12:20 AM   Specimen: Sputum  Result Value Ref Range Status   Specimen Description   Final    SPU Performed at [facility], [address] [place], [place] [address]    Special Requests   Final    NONE Reflexed from [id number] Performed at [facility], [address] [place], [place] [address]    Gram Stain   Final    ABUNDANT WBC PRESENT, PREDOMINANTLY PMN ABUNDANT SQUAMOUS EPITHELIAL CELLS PRESENT ABUNDANT GRAM POSITIVE COCCI ABUNDANT GRAM POSITIVE RODS MODERATE GRAM NEGATIVE RODS Performed at [facility], [address] [place], [place] [address]    Culture   Final    ABUNDANT PSEUDOMONAS AERUGINOSA ABUNDANT SERRATIA LIQUEFACIENS Two isolates with different morphologies were identified as the same organism.The most resistant organism was reported. FOR PSEUDOMONAS AERUGINOSA ABUNDANT STAPHYLOCOCCUS LUGDUNENSIS    Report Status 06/26/2023 FINAL  Final   Organism ID, Bacteria PSEUDOMONAS AERUGINOSA  Final   Organism ID, Bacteria SERRATIA LIQUEFACIENS  Final    Organism ID, Bacteria STAPHYLOCOCCUS LUGDUNENSIS  Final      Susceptibility   Pseudomonas aeruginosa - MIC*    CEFTAZIDIME <=1 SENSITIVE Sensitive     CIPROFLOXACIN <=0.25 SENSITIVE Sensitive     GENTAMICIN 2 SENSITIVE Sensitive     IMIPENEM 1 SENSITIVE Sensitive     PIP/TAZO <=4 SENSITIVE Sensitive ug/mL    CEFEPIME  2 SENSITIVE Sensitive     * ABUNDANT PSEUDOMONAS AERUGINOSA   Serratia liquefaciens - MIC*    CEFEPIME  <=0.12 SENSITIVE Sensitive     CEFTAZIDIME <=1 SENSITIVE Sensitive     CEFTRIAXONE  <=0.25 SENSITIVE Sensitive     CIPROFLOXACIN <=0.25 SENSITIVE Sensitive     GENTAMICIN <=1 SENSITIVE Sensitive     IMIPENEM 1 SENSITIVE Sensitive     TRIMETH/SULFA <=20 SENSITIVE Sensitive     PIP/TAZO <=4 SENSITIVE Sensitive ug/mL    * ABUNDANT SERRATIA LIQUEFACIENS   Staphylococcus lugdunensis - MIC*    CIPROFLOXACIN <=0.5 SENSITIVE Sensitive     ERYTHROMYCIN <=0.25 SENSITIVE Sensitive     GENTAMICIN <=0.5 SENSITIVE Sensitive     OXACILLIN 2 SENSITIVE Sensitive     TETRACYCLINE <=1 SENSITIVE Sensitive     VANCOMYCIN <=0.5 SENSITIVE Sensitive     TRIMETH/SULFA <=10 SENSITIVE Sensitive     CLINDAMYCIN <=0.25 SENSITIVE Sensitive     RIFAMPIN <=0.5 SENSITIVE Sensitive     Inducible Clindamycin NEGATIVE Sensitive     * ABUNDANT STAPHYLOCOCCUS LUGDUNENSIS    Radiology Studies: No results found.  Scheduled Meds:  atorvastatin   10 mg Oral QHS   budesonide -glycopyrrolate -formoterol   2 puff Inhalation BID   cholecalciferol   5,000 Units Oral Daily   heparin   5,000 Units Subcutaneous Q8H   levothyroxine   100 mcg Oral Q0600   loratadine   10 mg Oral Daily   metoprolol  succinate  12.5 mg Oral Daily   metroNIDAZOLE  500 mg Oral Q12H   pantoprazole   40 mg Oral Daily   sodium bicarbonate   1,300 mg Oral BID   Continuous Infusions:  ceFEPime  (MAXIPIME ) IV 2 g (06/27/23 0835)     LOS: 5 days   35 minutes with more than 50% spent in reviewing records, counseling patient/family and  coordinating care.  Reyes VEAR Gaw, MD [facility] [email or web address] 06/27/2023, 2:53 PM

## 2023-06-27 NOTE — Progress Notes (Addendum)
 INFECTIOUS DISEASE PROGRESS NOTE Date of Admission:  06/21/2023     ID: Eileen Wright is a 83 y.o. female with  PNA, bacteremia Principal Problem:   Sepsis due to pneumonia [facility]) Active Problems:   Hyperkalemia   Hypothyroidism   COPD (chronic obstructive pulmonary disease) (HCC)   Hypertension   Acute renal failure superimposed on stage 3b chronic kidney disease (HCC)   HLD (hyperlipidemia)   Chronic diastolic CHF (congestive heart failure) (HCC)   Anemia in chronic kidney disease (CKD)   Overweight (BMI 25.0-29.9)   Acute pyelonephritis   Metabolic acidosis   Subjective: No fevers wbc 23 reports breathing better and coughin up more junk   ROS  Eleven systems are reviewed and negative except per hpi  Medications:  Antibiotics Given (last 72 hours)     Date/Time Action Medication Dose Rate   06/24/23 2206 New Bag/Given   doxycycline  (VIBRAMYCIN ) 100 mg in sodium chloride  0.9 % 250 mL IVPB 100 mg 125 mL/hr   06/25/23 0920 New Bag/Given   ceFEPIme  (MAXIPIME ) 2 g in sodium chloride  0.9 % 100 mL IVPB 2 g 200 mL/hr   06/25/23 1107 New Bag/Given   doxycycline  (VIBRAMYCIN ) 100 mg in sodium chloride  0.9 % 250 mL IVPB 100 mg 125 mL/hr   06/25/23 1328 New Bag/Given   doxycycline  (VIBRAMYCIN ) 100 mg in sodium chloride  0.9 % 250 mL IVPB 100 mg 125 mL/hr   06/25/23 2148 New Bag/Given   doxycycline  (VIBRAMYCIN ) 100 mg in sodium chloride  0.9 % 250 mL IVPB 100 mg 125 mL/hr   06/26/23 0908 New Bag/Given   ceFEPIme  (MAXIPIME ) 2 g in sodium chloride  0.9 % 100 mL IVPB 2 g 200 mL/hr   06/26/23 1026 New Bag/Given   doxycycline  (VIBRAMYCIN ) 100 mg in sodium chloride  0.9 % 250 mL IVPB 100 mg 125 mL/hr   06/26/23 1610 Given   metroNIDAZOLE (FLAGYL) tablet 500 mg 500 mg    06/26/23 2057 New Bag/Given   ceFEPIme  (MAXIPIME ) 2 g in sodium chloride  0.9 % 100 mL IVPB 2 g 200 mL/hr   06/26/23 2101 Given   metroNIDAZOLE (FLAGYL) tablet 500 mg 500 mg    06/26/23 2248 New Bag/Given   doxycycline   (VIBRAMYCIN ) 100 mg in sodium chloride  0.9 % 250 mL IVPB 100 mg 125 mL/hr   06/27/23 0829 Given   metroNIDAZOLE (FLAGYL) tablet 500 mg 500 mg    06/27/23 0835 New Bag/Given   ceFEPIme  (MAXIPIME ) 2 g in sodium chloride  0.9 % 100 mL IVPB 2 g 200 mL/hr   06/27/23 1004 New Bag/Given   doxycycline  (VIBRAMYCIN ) 100 mg in sodium chloride  0.9 % 250 mL IVPB 100 mg 125 mL/hr       atorvastatin   10 mg Oral QHS   budesonide -glycopyrrolate -formoterol   2 puff Inhalation BID   cholecalciferol   5,000 Units Oral Daily   heparin   5,000 Units Subcutaneous Q8H   levothyroxine   100 mcg Oral Q0600   loratadine   10 mg Oral Daily   metoprolol  succinate  12.5 mg Oral Daily   metroNIDAZOLE  500 mg Oral Q12H   pantoprazole   40 mg Oral Daily   sodium bicarbonate   1,300 mg Oral BID    Objective: Vital signs in last 24 hours: Temp:  [98.2 F (36.8 C)-98.6 F (37 C)] 98.2 F (36.8 C) (06/24 0424) Pulse Rate:  [91-92] 92 (06/24 0424) Resp:  [16-18] 18 (06/24 0424) BP: (121-131)/(59-68) 124/59 (06/24 0424) SpO2:  [97 %-100 %] 97 % (06/24 0424) Weight:  [68.9 kg] 68.9 kg (  06/24 0500) Frail, lying in bed NAD RRR Bil rhonchi ABd soft nt nd Ext no cce   Lab Results Recent Labs    06/26/23 0205 06/27/23 0517  WBC 21.6* 23.2*  HGB 8.5* 8.9*  HCT 26.9* 27.9*  NA 141 141  K 3.6 3.7  CL 116* 115*  CO2 22 22  BUN 39* 34*  CREATININE 1.31* 1.16*    Microbiology: Results for orders placed or performed during the hospital encounter of 06/21/23  Blood Culture (routine x 2)     Status: None   Collection Time: 06/21/23  7:55 PM   Specimen: BLOOD  Result Value Ref Range Status   Specimen Description BLOOD BLOOD LEFT ARM  Final   Special Requests   Final    BOTTLES DRAWN AEROBIC AND ANAEROBIC Blood Culture adequate volume   Culture   Final    NO GROWTH 5 DAYS Performed at [facility], [address] [place], [place] [address]    Report Status 06/26/2023 FINAL  Final  Blood Culture  (routine x 2)     Status: Abnormal   Collection Time: 06/21/23  7:55 PM   Specimen: BLOOD RIGHT ARM  Result Value Ref Range Status   Specimen Description   Final    BLOOD RIGHT ARM Performed at [facility], [address], [place], [place] [address]    Special Requests   Final    BOTTLES DRAWN AEROBIC AND ANAEROBIC Blood Culture adequate volume Performed at [facility], [address] [place], [place] [address]    Culture  Setup Time   Final    Organism ID to follow GRAM NEGATIVE RODS ANAEROBIC BOTTLE ONLY CRITICAL RESULT CALLED TO, READ BACK BY AND VERIFIED WITH: Raquel Rodriguez guzman @1816  on 06/22/23 skl Performed at [facility], [address] [place], [place] [address]    Culture PANTOEA AGGLOMERANS (A)  Final   Report Status 06/26/2023 FINAL  Final   Organism ID, Bacteria PANTOEA AGGLOMERANS  Final      Susceptibility   Pantoea agglomerans - MIC*    CEFEPIME  <=0.12 SENSITIVE Sensitive     CEFTAZIDIME <=1 SENSITIVE Sensitive     CEFTRIAXONE  <=0.25 SENSITIVE Sensitive     CIPROFLOXACIN <=0.25 SENSITIVE Sensitive     GENTAMICIN <=1 SENSITIVE Sensitive     IMIPENEM 0.5 SENSITIVE Sensitive     TRIMETH/SULFA <=20 SENSITIVE Sensitive     PIP/TAZO >=128 RESISTANT Resistant ug/mL    * PANTOEA AGGLOMERANS  Urine Culture (for pregnant, neutropenic or urologic patients or patients with an indwelling urinary catheter)     Status: Abnormal   Collection Time: 06/21/23  7:55 PM   Specimen: Urine, Clean Catch  Result Value Ref Range Status   Specimen Description   Final    URINE, CLEAN CATCH Performed at [facility], [address], [place], [place] [address]    Special Requests   Final    NONE Performed at [facility], [address] [place], [place] [address]    Culture MULTIPLE SPECIES PRESENT, SUGGEST RECOLLECTION (A)  Final   Report Status 06/23/2023 FINAL  Final  Blood Culture ID Panel (Reflexed)     Status: Abnormal    Collection Time: 06/21/23  7:55 PM  Result Value Ref Range Status   Enterococcus faecalis NOT DETECTED NOT DETECTED Final   Enterococcus Faecium NOT DETECTED NOT DETECTED Final   Listeria monocytogenes NOT DETECTED NOT DETECTED Final   Staphylococcus species NOT DETECTED NOT DETECTED Final  Staphylococcus aureus (BCID) NOT DETECTED NOT DETECTED Final   Staphylococcus epidermidis NOT DETECTED NOT DETECTED Final   Staphylococcus lugdunensis NOT DETECTED NOT DETECTED Final   Streptococcus species NOT DETECTED NOT DETECTED Final   Streptococcus agalactiae NOT DETECTED NOT DETECTED Final   Streptococcus pneumoniae NOT DETECTED NOT DETECTED Final   Streptococcus pyogenes NOT DETECTED NOT DETECTED Final   A.calcoaceticus-baumannii NOT DETECTED NOT DETECTED Final   Bacteroides fragilis NOT DETECTED NOT DETECTED Final   Enterobacterales DETECTED (A) NOT DETECTED Final    Comment: Enterobacterales represent a large order of gram negative bacteria, not a single organism. Refer to culture for further identification. CRITICAL RESULT CALLED TO, READ BACK BY AND VERIFIED WITH: Raquel Rodriguez guzman @1816  on 06/22/23 skl    Enterobacter cloacae complex NOT DETECTED NOT DETECTED Final   Escherichia coli NOT DETECTED NOT DETECTED Final   Klebsiella aerogenes NOT DETECTED NOT DETECTED Final   Klebsiella oxytoca NOT DETECTED NOT DETECTED Final   Klebsiella pneumoniae NOT DETECTED NOT DETECTED Final   Proteus species NOT DETECTED NOT DETECTED Final   Salmonella species NOT DETECTED NOT DETECTED Final   Serratia marcescens NOT DETECTED NOT DETECTED Final   Haemophilus influenzae NOT DETECTED NOT DETECTED Final   Neisseria meningitidis NOT DETECTED NOT DETECTED Final   Pseudomonas aeruginosa NOT DETECTED NOT DETECTED Final   Stenotrophomonas maltophilia NOT DETECTED NOT DETECTED Final   Candida albicans NOT DETECTED NOT DETECTED Final   Candida auris NOT DETECTED NOT DETECTED Final   Candida glabrata  NOT DETECTED NOT DETECTED Final   Candida krusei NOT DETECTED NOT DETECTED Final   Candida parapsilosis NOT DETECTED NOT DETECTED Final   Candida tropicalis NOT DETECTED NOT DETECTED Final   Cryptococcus neoformans/gattii NOT DETECTED NOT DETECTED Final   CTX-M ESBL NOT DETECTED NOT DETECTED Final   Carbapenem resistance IMP NOT DETECTED NOT DETECTED Final   Carbapenem resistance KPC NOT DETECTED NOT DETECTED Final   Carbapenem resistance NDM NOT DETECTED NOT DETECTED Final   Carbapenem resist OXA 48 LIKE NOT DETECTED NOT DETECTED Final   Carbapenem resistance VIM NOT DETECTED NOT DETECTED Final    Comment: Performed at [facility], [address] [place], [place] [address]  Expectorated Sputum Assessment w Gram Stain, Rflx to Resp Cult     Status: None   Collection Time: 06/22/23 12:20 AM   Specimen: Urine, Clean Catch; Sputum  Result Value Ref Range Status   Specimen Description SPU  Final   Special Requests NONE  Final   Sputum evaluation   Final    THIS SPECIMEN IS ACCEPTABLE FOR SPUTUM CULTURE Performed at [facility], [address] [place], [place] [address]    Report Status 06/22/2023 FINAL  Final  Resp panel by RT-PCR (RSV, Flu A&B, Covid) Urine, Clean Catch     Status: None   Collection Time: 06/22/23 12:20 AM   Specimen: Urine, Clean Catch; Nasal Swab  Result Value Ref Range Status   SARS Coronavirus 2 by RT PCR NEGATIVE NEGATIVE Final    Comment: (NOTE) SARS-CoV-2 target nucleic acids are NOT DETECTED.  The SARS-CoV-2 RNA is generally detectable in upper respiratory specimens during the acute phase of infection. The lowest concentration of SARS-CoV-2 viral copies this assay can detect is 138 copies/mL. A negative result does not preclude SARS-Cov-2 infection and should not be used as the sole basis for treatment or other patient management decisions. A negative result may occur with  improper specimen collection/handling, submission of specimen  other than  nasopharyngeal swab, presence of viral mutation(s) within the areas targeted by this assay, and inadequate number of viral copies(<138 copies/mL). A negative result must be combined with clinical observations, patient history, and epidemiological information. The expected result is Negative.  Fact Sheet for Patients:  BloggerCourse.com  Fact Sheet for Healthcare Providers:  SeriousBroker.it  This test is no t yet approved or cleared by the [place]  FDA and  has been authorized for detection and/or diagnosis of SARS-CoV-2 by FDA under an Emergency Use Authorization (EUA). This EUA will remain  in effect (meaning this test can be used) for the duration of the COVID-19 declaration under Section 564(b)(1) of the Act, 21 U.S.C.section 360bbb-3(b)(1), unless the authorization is terminated  or revoked sooner.       Influenza A by PCR NEGATIVE NEGATIVE Final   Influenza B by PCR NEGATIVE NEGATIVE Final    Comment: (NOTE) The Xpert Xpress SARS-CoV-2/FLU/RSV plus assay is intended as an aid in the diagnosis of influenza from Nasopharyngeal swab specimens and should not be used as a sole basis for treatment. Nasal washings and aspirates are unacceptable for Xpert Xpress SARS-CoV-2/FLU/RSV testing.  Fact Sheet for Patients: BloggerCourse.com  Fact Sheet for Healthcare Providers: SeriousBroker.it  This test is not yet approved or cleared by the [place]  FDA and has been authorized for detection and/or diagnosis of SARS-CoV-2 by FDA under an Emergency Use Authorization (EUA). This EUA will remain in effect (meaning this test can be used) for the duration of the COVID-19 declaration under Section 564(b)(1) of the Act, 21 U.S.C. section 360bbb-3(b)(1), unless the authorization is terminated or revoked.     Resp Syncytial Virus by PCR NEGATIVE NEGATIVE Final     Comment: (NOTE) Fact Sheet for Patients: BloggerCourse.com  Fact Sheet for Healthcare Providers: SeriousBroker.it  This test is not yet approved or cleared by the [place]  FDA and has been authorized for detection and/or diagnosis of SARS-CoV-2 by FDA under an Emergency Use Authorization (EUA). This EUA will remain in effect (meaning this test can be used) for the duration of the COVID-19 declaration under Section 564(b)(1) of the Act, 21 U.S.C. section 360bbb-3(b)(1), unless the authorization is terminated or revoked.  Performed at [facility], [address] [place], [place] [address]   Culture, Respiratory w Gram Stain     Status: None   Collection Time: 06/22/23 12:20 AM   Specimen: Sputum  Result Value Ref Range Status   Specimen Description   Final    SPU Performed at [facility], [address] [place], [place] [address]    Special Requests   Final    NONE Reflexed from [id number] Performed at [facility], [address] [place], [place] [address]    Gram Stain   Final    ABUNDANT WBC PRESENT, PREDOMINANTLY PMN ABUNDANT SQUAMOUS EPITHELIAL CELLS PRESENT ABUNDANT GRAM POSITIVE COCCI ABUNDANT GRAM POSITIVE RODS MODERATE GRAM NEGATIVE RODS Performed at [facility], [address] [place], [place] [address]    Culture   Final    ABUNDANT PSEUDOMONAS AERUGINOSA ABUNDANT SERRATIA LIQUEFACIENS Two isolates with different morphologies were identified as the same organism.The most resistant organism was reported. FOR PSEUDOMONAS AERUGINOSA ABUNDANT STAPHYLOCOCCUS LUGDUNENSIS    Report Status 06/26/2023 FINAL  Final   Organism ID, Bacteria PSEUDOMONAS AERUGINOSA  Final   Organism ID, Bacteria SERRATIA LIQUEFACIENS  Final   Organism ID, Bacteria STAPHYLOCOCCUS LUGDUNENSIS  Final      Susceptibility   Pseudomonas aeruginosa - MIC*  CEFTAZIDIME <=1 SENSITIVE Sensitive      CIPROFLOXACIN <=0.25 SENSITIVE Sensitive     GENTAMICIN 2 SENSITIVE Sensitive     IMIPENEM 1 SENSITIVE Sensitive     PIP/TAZO <=4 SENSITIVE Sensitive ug/mL    CEFEPIME  2 SENSITIVE Sensitive     * ABUNDANT PSEUDOMONAS AERUGINOSA   Serratia liquefaciens - MIC*    CEFEPIME  <=0.12 SENSITIVE Sensitive     CEFTAZIDIME <=1 SENSITIVE Sensitive     CEFTRIAXONE  <=0.25 SENSITIVE Sensitive     CIPROFLOXACIN <=0.25 SENSITIVE Sensitive     GENTAMICIN <=1 SENSITIVE Sensitive     IMIPENEM 1 SENSITIVE Sensitive     TRIMETH/SULFA <=20 SENSITIVE Sensitive     PIP/TAZO <=4 SENSITIVE Sensitive ug/mL    * ABUNDANT SERRATIA LIQUEFACIENS   Staphylococcus lugdunensis - MIC*    CIPROFLOXACIN <=0.5 SENSITIVE Sensitive     ERYTHROMYCIN <=0.25 SENSITIVE Sensitive     GENTAMICIN <=0.5 SENSITIVE Sensitive     OXACILLIN 2 SENSITIVE Sensitive     TETRACYCLINE <=1 SENSITIVE Sensitive     VANCOMYCIN <=0.5 SENSITIVE Sensitive     TRIMETH/SULFA <=10 SENSITIVE Sensitive     CLINDAMYCIN <=0.25 SENSITIVE Sensitive     RIFAMPIN <=0.5 SENSITIVE Sensitive     Inducible Clindamycin NEGATIVE Sensitive     * ABUNDANT STAPHYLOCOCCUS LUGDUNENSIS    Studies/Results: No results found.  Assessment/Plan: LOTUS SANTILLO is a 83 y.o. female with multiple med issues admitted with cough, dysuria, elevated lactate, elevated wbc. UA +, CT renal neg for pyelo but with RLL PNA. 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   Recommendations Pneumonia- extensive esp R sided based on CT abd. Reports thick purulent sputum which has persisted. I suspect aspiration based on mixed cultures and CT and clinical history. Sputum CX Serratia, Pseudomonas and staph lugdenesis. Persistently elevated wbc.  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 CT with no evidence pyelo. Should be adequately covered.  Thank you very much for the consult. Will follow with you.  Alm SHAUNNA Needle   06/27/2023, 1:37 PM

## 2023-06-27 NOTE — Progress Notes (Signed)
 Physical Therapy Treatment Patient Details Name: Eileen Wright MRN: [id number] DOB: Sep 13, 1940 Today's Date: 06/27/2023   History of Present Illness Pt is an 83 yo female that presented the ED for SOB, sputum.  workup for pneumonia and acute pyelonephritis, potential UTI. PMH of COPD, HTN, HLD, dCHF, hypothyroidism, CKD 3B, anemia, former smoker.    PT Comments  Patient is agreeable to PT session. She declined standing at this time but was agreeable to sit edge of bed. Education for techniques for increased independence with bed mobility in the home setting. Encouraged routine mobility with staff assistance to promote upright conditioning and readiness for home discharge. No dyspnea with exertion. PT will continue to follow.    If plan is discharge home, recommend the following: A little help with walking and/or transfers;A little help with bathing/dressing/bathroom;Assistance with cooking/housework;Assist for transportation;Help with stairs or ramp for entrance   Can travel by private vehicle        Equipment Recommendations  None recommended by PT    Recommendations for Other Services       Precautions / Restrictions Precautions Precautions: Fall Recall of Precautions/Restrictions: Intact Restrictions Weight Bearing Restrictions Per Provider Order: No     Mobility  Bed Mobility Overal bed mobility: Needs Assistance Bed Mobility: Supine to Sit     Supine to sit: Contact guard Sit to supine: Min assist   General bed mobility comments: assistance for BLE support. cues for techniques to facilitate independence in home setting    Transfers                   General transfer comment: patient declined    Ambulation/Gait                   Stairs             Wheelchair Mobility     Tilt Bed    Modified Rankin (Stroke Patients Only)       Balance Overall balance assessment: Needs assistance Sitting-balance support: Feet unsupported, Single  extremity supported Sitting balance-Leahy Scale: Fair                                      Hotel manager: Impaired  Cognition Arousal: Alert Behavior During Therapy: WFL for tasks assessed/performed   PT - Cognitive impairments: No apparent impairments                         Following commands: Intact      Cueing Cueing Techniques: Verbal cues  Exercises      General Comments General comments (skin integrity, edema, etc.): no dyspnea with exertion noted. encouraged routine mobility for upright conditioning and to maintain strength in preparation for eventual discharge home      Pertinent Vitals/Pain Pain Assessment Pain Assessment: No/denies pain    Home Living                          Prior Function            PT Goals (current goals can now be found in the care plan section) Acute Rehab PT Goals Patient Stated Goal: to feel better PT Goal Formulation: With patient Time For Goal Achievement: 07/07/23 Potential to Achieve Goals: Good Progress towards PT goals: Progressing toward goals    Frequency    Min 2X/week  PT Plan      Co-evaluation              AM-PAC PT 6 Clicks Mobility   Outcome Measure  Help needed turning from your back to your side while in a flat bed without using bedrails?: A Little Help needed moving from lying on your back to sitting on the side of a flat bed without using bedrails?: A Little Help needed moving to and from a bed to a chair (including a wheelchair)?: A Little Help needed standing up from a chair using your arms (e.g., wheelchair or bedside chair)?: A Little Help needed to walk in hospital room?: A Little Help needed climbing 3-5 steps with a railing? : A Lot 6 Click Score: 17    End of Session   Activity Tolerance: Patient limited by fatigue Patient left: in bed;with call bell/phone within reach;with bed alarm set Nurse Communication:  Mobility status PT Visit Diagnosis: Other abnormalities of gait and mobility (R26.89);Difficulty in walking, not elsewhere classified (R26.2);Muscle weakness (generalized) (M62.81)     Time: 8580-8566 PT Time Calculation (min) (ACUTE ONLY): 14 min  Charges:    $Therapeutic Activity: 8-22 mins PT General Charges $$ ACUTE PT VISIT: 1 Visit                    Randine Essex, PT, MPT    Randine LULLA Essex 06/27/2023, 2:50 PM

## 2023-06-27 NOTE — Progress Notes (Signed)
 Mobility Specialist - Progress Note    06/27/23 1135  Mobility  Activity Off unit    Pt being transported for CT scan upon arrival. Will attempt another date/time.    Lennette Seip Mobility Specialist 06/27/23, 11:36 AM

## 2023-06-28 DIAGNOSIS — I5032 Chronic diastolic (congestive) heart failure: Secondary | ICD-10-CM | POA: Diagnosis not present

## 2023-06-28 DIAGNOSIS — N179 Acute kidney failure, unspecified: Secondary | ICD-10-CM | POA: Diagnosis not present

## 2023-06-28 DIAGNOSIS — J189 Pneumonia, unspecified organism: Secondary | ICD-10-CM | POA: Diagnosis not present

## 2023-06-28 DIAGNOSIS — A419 Sepsis, unspecified organism: Secondary | ICD-10-CM | POA: Diagnosis not present

## 2023-06-28 DIAGNOSIS — D75838 Other thrombocytosis: Secondary | ICD-10-CM | POA: Insufficient documentation

## 2023-06-28 LAB — CBC
HCT: 25.1 % — ABNORMAL LOW (ref 36.0–46.0)
Hemoglobin: 8.1 g/dL — ABNORMAL LOW (ref 12.0–15.0)
MCH: 29.9 pg (ref 26.0–34.0)
MCHC: 32.3 g/dL (ref 30.0–36.0)
MCV: 92.6 fL (ref 80.0–100.0)
Platelets: 436 10*3/uL — ABNORMAL HIGH (ref 150–400)
RBC: 2.71 MIL/uL — ABNORMAL LOW (ref 3.87–5.11)
RDW: 15.3 % (ref 11.5–15.5)
WBC: 22 10*3/uL — ABNORMAL HIGH (ref 4.0–10.5)
nRBC: 0 % (ref 0.0–0.2)

## 2023-06-28 LAB — BASIC METABOLIC PANEL WITH GFR
Anion gap: 8 (ref 5–15)
BUN: 36 mg/dL — ABNORMAL HIGH (ref 8–23)
CO2: 19 mmol/L — ABNORMAL LOW (ref 22–32)
Calcium: 10 mg/dL (ref 8.9–10.3)
Chloride: 112 mmol/L — ABNORMAL HIGH (ref 98–111)
Creatinine, Ser: 1.23 mg/dL — ABNORMAL HIGH (ref 0.44–1.00)
GFR, Estimated: 44 mL/min — ABNORMAL LOW (ref >60)
Glucose, Bld: 79 mg/dL (ref 70–99)
Potassium: 3.9 mmol/L (ref 3.5–5.1)
Sodium: 139 mmol/L (ref 135–145)

## 2023-06-28 LAB — PROCALCITONIN: Procalcitonin: 0.3 ng/mL

## 2023-06-28 MED ORDER — IPRATROPIUM-ALBUTEROL 0.5-2.5 (3) MG/3ML IN SOLN
3.0000 mL | Freq: Three times a day (TID) | RESPIRATORY_TRACT | Status: DC
  Administered 2023-06-28 – 2023-06-30 (×5): 3 mL via RESPIRATORY_TRACT
  Filled 2023-06-28 (×5): qty 3

## 2023-06-28 MED ORDER — IPRATROPIUM-ALBUTEROL 0.5-2.5 (3) MG/3ML IN SOLN
3.0000 mL | Freq: Four times a day (QID) | RESPIRATORY_TRACT | Status: DC
  Administered 2023-06-28 (×2): 3 mL via RESPIRATORY_TRACT
  Filled 2023-06-28 (×2): qty 3

## 2023-06-28 MED ORDER — ACETYLCYSTEINE 20 % IN SOLN
3.0000 mL | Freq: Two times a day (BID) | RESPIRATORY_TRACT | Status: DC
  Administered 2023-06-28 – 2023-07-04 (×13): 3 mL via RESPIRATORY_TRACT
  Filled 2023-06-28 (×14): qty 4

## 2023-06-28 NOTE — Plan of Care (Signed)
   Problem: Education: Goal: Knowledge of General Education information will improve Description: Including pain rating scale, medication(s)/side effects and non-pharmacologic comfort measures Outcome: Progressing   Problem: Clinical Measurements: Goal: Will remain free from infection Outcome: Progressing

## 2023-06-28 NOTE — Evaluation (Addendum)
 Clinical/Bedside Swallow Evaluation Patient Details  Name: Eileen Wright MRN: [id number] Date of Birth: [birth year]  Today's Date: 06/28/2023 Time: SLP Start Time (ACUTE ONLY): 1255 SLP Stop Time (ACUTE ONLY): 1355 SLP Time Calculation (min) (ACUTE ONLY): 60 min  Past Medical History:  Past Medical History:  Diagnosis Date   Anemia    Arthritis    Cardiomyopathy (HCC)    COPD (chronic obstructive pulmonary disease) (HCC)    GERD (gastroesophageal reflux disease)    Heart murmur    Hyperlipemia    Hypertension    Hypothyroidism    Mitral valve insufficiency    Pneumonia    Past Surgical History:  Past Surgical History:  Procedure Laterality Date   APPENDECTOMY     BREAST BIOPSY     TONSILLECTOMY     adenoids   TOTAL HIP ARTHROPLASTY Left 12/03/2020   Procedure: TOTAL HIP ARTHROPLASTY ANTERIOR APPROACH;  Surgeon: Fidel Rogue, MD;  Location: [facility];  Service: Orthopedics;  Laterality: Left;   HPI:  Pt is an 83 yo female that presented the ED for SOB, sputum.  workup for pneumonia and acute pyelonephritis, potential UTI.  Pt is on Strict Fluid Restrictions in her diet Baseline and on Diuretics at home per chart. Followed by Nephrology.  PMH of Mitral Regurgitation, COPD, HTN, HLD, dCHF, hypothyroidism, CKD 3B, anemia, former smoker.  Chest CT this admit: Lungs/Pleura: Large area of consolidation involving the right lower  and right middle lobes as well as areas of consolidation at the left  lung base. There is occlusion of the right middle and right lower  lobe bronchi likely with mucous impaction or aspirated content. An  endobronchial lesion is not excluded. Several ground-glass nodules  in the left lower lobe as well as left upper lobe measure up to 9  mm.  Small Bilateral pleural effusions.  OF NOTE: Pt has no other chest imaging since 2006; no head imaging since 2006.    Assessment / Plan / Recommendation  Clinical Impression   Pt seen for BSE today. Pt awake, verbal and A/O  x3. She answered basic questions re: self and her medical history to the best of her knowledge. Noted the gravely vocal quality at baseline- pt endorsed this ~1+ yrs; tx sought. Pt denied any swallowing issues at home prior to admit; she denied any choking/coughing events w/ po's in recent 1 week. Pt eats a regular diet at home but does not eat much at a time per her report. Pt is using her incentive spirometer but eager to learn how to use the flutter valve(instruction given post NSG providing). On RA, afebrile. WBC 22.0. Infectious Disease following and consulting Pulmonary d/t mucous plugging.   Pt appears to present w/ functional oropharyngeal phase swallowing w/ No overt oropharyngeal phase dysphagia noted, No neuromuscular deficits noted observed. Pt consumed po trials w/ No immediate, overt clinical s/s of aspiration during po trials. Pt exhibited a congested cough when moving about in bed (as at baseline Prior To po's but not to the po trials swallowed.  Pt appears at reduced risk for aspiration following general aspiration precautions and taking rest breaks as needed for conservation of energy; also encouraged REFLUX precautions. However, pt does have challenging factors that could impact her oropharyngeal swallowing to include Pulmonary decline w/ congested cough, deconditioning/weakness, REFLUX on PPI, and advanced age. She is also on strict fluid restrictions which impacts her meal choices. Pt endorses less appetite/intake.   During po trials, pt consumed all consistencies w/ no  overt coughing, decline in vocal quality, or change in respiratory presentation during/post trials. O2 sats 98% when checked. Oral phase appeared [facility] w/ timely bolus management, mastication, and control of bolus propulsion for A-P transfer for swallowing. Oral clearing achieved w/ all trial consistencies -- moistened, soft foods given.  OM Exam appeared [facility] w/ no unilateral weakness noted. Speech Clear. Pt fed self w/  setup support.   Recommend continue a fairly Regular consistency diet w/ well-Cut meats, moistened foods; Thin liquids -- monitor straw use, and pt should Hold Cup when drinking. Recommend general aspiration precautions including less talking at meals w/ rest breaks as needed for conservation of energy. Choose foods easy to eat also. REFLUX precautions. Tray setup and sitting up support for meals. Pills 1-2 at a time (baseline) vs WHOLE in Puree for ease as desired.  Education given on Pills in Puree; food consistencies and easy to eat options; general aspiration and REFLUX precautions to pt. Practiced the flutter valve w/ pt-- good results!  ST services will monitor pt's status next 1-2 days for any further needs. At this time, no objective swallow assessment indicated as pt does not present w/ clinical need for such- but will continue to monitor. NSG updated. MD updated. Recommend Dietician f/u for support. Precautions posted in chart, room.  SLP Visit Diagnosis: Dysphagia, unspecified (R13.10) (REFLUX at baseline- on PPI; Fluid Restrictions in diet)    Aspiration Risk   (reduced from an oropharyngeal phase standpoint)    Diet Recommendation   Thin;Age appropriate regular (cut meats, foods) = a fairly Regular consistency diet w/ well-Cut meats, moistened foods; Thin liquids -- monitor straw use, and pt should Hold Cup when drinking. Recommend general aspiration precautions including less talking at meals w/ rest breaks as needed for conservation of energy. Choose foods easy to eat also. REFLUX precautions. Tray setup and sitting up support for meals.  Medication Administration: Whole meds with liquid (vs whole in puree if desired for ease)    Other  Recommendations Recommended Consults:  (Dietician; GI for REFLUX management as needed) Oral Care Recommendations: Oral care BID;Patient independent with oral care     Assistance Recommended at Discharge  intermittent  Functional Status Assessment  Patient has not had a recent decline in their functional status (per her report)  Frequency and Duration min 1 x/week  1 week       Prognosis Prognosis for improved oropharyngeal function: Good Barriers/Prognosis Comment: Reflux; reduced desire for po's; FLUID RESTRICTIONS IN DIET      Swallow Study   General Date of Onset: 06/21/23 HPI: Pt is an 83 yo female that presented the ED for SOB, sputum.  workup for pneumonia and acute pyelonephritis, potential UTI.  Pt is on Strict Fluid Restrictions in her diet Baseline and on Diuretics at home per chart. Followed by Nephrology.  PMH of Mitral Regurgitation, COPD, HTN, HLD, dCHF, hypothyroidism, CKD 3B, anemia, former smoker.  Chest CT this admit: Lungs/Pleura: Large area of consolidation involving the right lower  and right middle lobes as well as areas of consolidation at the left  lung base. There is occlusion of the right middle and right lower  lobe bronchi likely with mucous impaction or aspirated content. An  endobronchial lesion is not excluded. Several ground-glass nodules  in the left lower lobe as well as left upper lobe measure up to 9  mm.  Small Bilateral pleural effusions.  OF NOTE: Pt has no other chest imaging since 2006; no head imaging since 2006.  Type of Study: Bedside Swallow Evaluation Previous Swallow Assessment: none Diet Prior to this Study: Regular;Thin liquids (Level 0) Temperature Spikes Noted: No (wbc 22.0) Respiratory Status: Room air History of Recent Intubation: No Behavior/Cognition: Alert;Cooperative;Pleasant mood Oral Cavity Assessment: Within Functional Limits Oral Care Completed by SLP: Yes Oral Cavity - Dentition: Missing dentition (few) Vision: Functional for self-feeding Self-Feeding Abilities: Able to feed self;Needs set up Patient Positioning: Upright in bed (supported better w/ pillows - appeared min kyphotic) Baseline Vocal Quality:  (gravely- baseline) Volitional Cough: Strong;Congested Volitional  Swallow: Able to elicit    Oral/Motor/Sensory Function Overall Oral Motor/Sensory Function: Within functional limits   Ice Chips Ice chips: Within functional limits Presentation: Spoon (fed; 2 trials)   Thin Liquid Thin Liquid: Within functional limits Presentation: Cup;Self Fed;Straw (4 trials via cup; 5 trials via straw)    Nectar Thick Nectar Thick Liquid: Not tested   Honey Thick Honey Thick Liquid: Not tested   Puree Puree: Within functional limits Presentation: Self Fed;Spoon (10+ trials)   Solid     Solid: Within functional limits Presentation: Self Fed;Spoon (6 trials) Other Comments: moistened        Comer Portugal, MS, CCC-SLP Speech Language Pathologist Rehab Services; [facility] [phone] (ascom) Eileen Wright 06/28/2023,4:37 PM

## 2023-06-28 NOTE — Progress Notes (Signed)
 Progress Note   Patient: Eileen Wright FMW:[id number] DOB: 21-Jun-1940 DOA: 06/21/2023     6 DOS: the patient was seen and examined on 06/28/2023   Brief hospital course: Eileen Wright is a 83 y.o. female with medical history significant of COPD, HTN, HLD, dCHF, hypothyroidism, CKD 3B, anemia, former smoker, who presents with cough with greenish sputum production and SOB for 3 days. Patient also reports dysuria and burning micturition with left flank pain.  On presentation patient had mild tachycardia and tachypnea, labs with leukocytosis at 20.9, BNP 500, lactic acid 1.1, CO2 of 12, potassium 5.2, AKI with creatinine at 3.37, BUN 105, recent baseline creatinine of 2.23 on 05/24/2023. Chest x-ray with bilateral lower lobe opacity and small bilateral pleural effusions.  Patient also has a profound elevation of procalcitonin level of 53.47.  Patient is seen by ID, started on cefepime .  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  Sputum culture grow Pseudomonas, Serratia and Staphylococcus Lugdunensis.  Chest CT scan was performed on 6/27, showed a mucous plug in the right upper and middle lobe bronchi.  With consolidation. Patient was started on DuoNeb, Mucomyst.   Principal Problem:   Sepsis due to pneumonia [facility]) Active Problems:   COPD (chronic obstructive pulmonary disease) (HCC)   Acute pyelonephritis   Chronic diastolic CHF (congestive heart failure) (HCC)   Hypothyroidism   Hypertension   Metabolic acidosis   Acute renal failure superimposed on stage 3b chronic kidney disease (HCC)   HLD (hyperlipidemia)   Anemia in chronic kidney disease (CKD)   Hyperkalemia   Overweight (BMI 25.0-29.9)   Reactive thrombocytosis   Assessment and Plan: Sepsis due to pneumonia (HCC) Right sided aspiration pneumonia. Patient does not have hypoxemia.  She has a profound elevation of procalcitonin level, which has dropped down to 0.3 after antibiotic treatment.   Still has persistent leukocytosis. Based on CT scan, patient has significant mucous plug, patient also coughed up a large amount of mucus.  I started him DuoNeb and Mucomyst. Appreciate ID consult.   Acute pyelonephritis Patient has a positive urinalysis, typical symptoms for urinary infection, with left flank pain, indicating acute pyelonephritis.  CT renal stone study was without any acute abnormality. -Urine cultures with multiple species On cefepime .   Acute renal failure superimposed on stage 3b chronic kidney disease (HCC) Metabolic acidosis. Hypokalemia. Likely secondary to concern of sepsis and continuation of diuretics. Creatinine level had dropped on baseline.  Patient developed some metabolic acidosis, will continue sodium Bicarb.  Recheck BMP tomorrow   COPD (chronic obstructive pulmonary disease) (HCC) No exacerbation.   Chronic diastolic CHF (congestive heart failure) (HCC) 2D echo 02/01/2022 showed EF > 55% with grade 1 diastolic dysfunction, patient has mild fluid overload as discussed above. BNP elevated at 500, but her creatinine was 3.37 at that time.  Patient clinically does not have volume overload.  No shortness of breath or hypoxemia.  Will continue hold off diuretics for today.   Hypothyroidism - Continue home Synthroid    Hypertension Continue to monitor blood pressure.   HLD (hyperlipidemia) - Continue the home Lipitor   Anemia in chronic kidney disease (CKD) Reactive thrombocytosis. No active bleeding, hemoglobin 8.1, trending down.  Recheck CBC tomorrow.  Transfuse with hemoglobin less than 7   Overweight (BMI 25.0-29.9) Body weight 66.7 kg, BMI 26.90 - Encourage losing weight - Exercise healthy diet      Subjective:  Patient doing well today, denies any shortness of breath or cough.  Physical  Exam: Vitals:   06/28/23 0500 06/28/23 0814 06/28/23 1437 06/28/23 1441  BP:  (!) 107/48  (!) 119/54  Pulse:  85  95  Resp:  18    Temp:  98.1 F  (36.7 C)    TempSrc:      SpO2:  98% 97%   Weight: 65.3 kg     Height:       General exam: Appears calm and comfortable  Respiratory system: Clear to auscultation. Respiratory effort normal. Cardiovascular system: S1 & S2 heard, RRR. No JVD, murmurs, rubs, gallops or clicks. No pedal edema. Gastrointestinal system: Abdomen is nondistended, soft and nontender. No organomegaly or masses felt. Normal bowel sounds heard. Central nervous system: Alert and oriented. No focal neurological deficits. Extremities: Symmetric 5 x 5 power. Skin: No rashes, lesions or ulcers Psychiatry: Judgement and insight appear normal. Mood & affect appropriate.    Data Reviewed:  Reviewed CT scan results and lab results.  Family Communication: None  Disposition: Status is: Inpatient Remains inpatient appropriate because: Severity of disease, IV treatment.     Time spent: 35 minutes  Author: Murvin Mana, MD 06/28/2023 3:31 PM  For on call review [email or web address].

## 2023-06-28 NOTE — Progress Notes (Signed)
 Physical Therapy Treatment Patient Details Name: Eileen Wright MRN: [id number] DOB: 1940-08-23 Today's Date: 06/28/2023   History of Present Illness Pt is Eileen 83 yo female that presented the ED for SOB, sputum.  workup for pneumonia and acute pyelonephritis, potential UTI. PMH of COPD, HTN, HLD, dCHF, hypothyroidism, CKD 3B, anemia, former smoker.    PT Comments  Pt was long sitting in bed upon arrival. Just received breathing treatment and is agreeable to PT session. Pt was easily and safely able to exit bed, stand to RW, and tolerate ambulation ~ 30 ft. Distance severely limited by BLE knee pain in addition to SOB. Per both pt/pt's daughter, she would be SOB like that at home too. Pt remains unwilling to consider rehab at DC. Recommend HHPT to maximize independence and safety with all ADLs. Daughter lives with pt and is agreeable to this plan.    If plan is discharge home, recommend the following: A little help with walking and/or transfers;A little help with bathing/dressing/bathroom;Assistance with cooking/housework;Assist for transportation;Help with stairs or ramp for entrance     Equipment Recommendations  None recommended by PT       Precautions / Restrictions Precautions Precautions: Fall Recall of Precautions/Restrictions: Intact Restrictions Weight Bearing Restrictions Per Provider Order: No     Mobility  Bed Mobility Overal bed mobility: Needs Assistance Bed Mobility: Supine to Sit, Sit to Supine  Supine to sit: Supervision, HOB elevated, Used rails  General bed mobility comments: no physical assistance required to exit bed.    Transfers Overall transfer level: Needs assistance Equipment used: Rolling walker (2 wheels) Transfers: Sit to/from Stand Sit to Stand: Supervision  General transfer comment: Pt was able to stand from lowest bed height without physical assistance however did require Vcs for improved technique and fwd wt shift.     Ambulation/Gait Ambulation/Gait assistance: Supervision Gait Distance (Feet): 30 Feet Assistive device: Rolling walker (2 wheels) Gait Pattern/deviations: Step-through pattern, Antalgic Gait velocity: decreased     General Gait Details: Pt was able to tolerate ambulation ~ 30 ft total. distnace limited by BLE knee pain. I need to go get another injection. Pt is SOB with minimal activity however per pt and then later pt's daughter. She is SOB at baseline.      Balance Overall balance assessment: Needs assistance Sitting-balance support: Feet unsupported, Single extremity supported Sitting balance-Leahy Scale: Fair     Standing balance support: During functional activity, Single extremity supported Standing balance-Leahy Scale: Fair      Hotel manager: Impaired  Cognition Arousal: Alert Behavior During Therapy: WFL for tasks assessed/performed   PT - Cognitive impairments: No apparent impairments    PT - Cognition Comments: Pt is A and O x  4 Following commands: Intact      Cueing Cueing Techniques: Verbal cues         Pertinent Vitals/Pain Pain Assessment Pain Assessment: 0-10 Pain Score: 8  Pain Location: B knee pain in wt bearing Pain Descriptors / Indicators: Discomfort Pain Intervention(s): Limited activity within patient's tolerance, Monitored during session, Premedicated before session, Repositioned     PT Goals (current goals can now be found in the care plan section) Acute Rehab PT Goals Patient Stated Goal: go home when able Progress towards PT goals: Progressing toward goals    Frequency    Min 2X/week       AM-PAC PT 6 Clicks Mobility   Outcome Measure  Help needed turning from your back to your side while in a flat  bed without using bedrails?: A Little Help needed moving from lying on your back to sitting on the side of a flat bed without using bedrails?: A Little Help needed moving to and from a bed to  a chair (including a wheelchair)?: A Little Help needed standing up from a chair using your arms (e.g., wheelchair or bedside chair)?: A Little Help needed to walk in hospital room?: A Little Help needed climbing 3-5 steps with a railing? : A Lot 6 Click Score: 17    End of Session   Activity Tolerance: Patient limited by fatigue Patient left: in chair;with call bell/phone within reach;with chair alarm set Nurse Communication: Mobility status PT Visit Diagnosis: Other abnormalities of gait and mobility (R26.89);Difficulty in walking, not elsewhere classified (R26.2);Muscle weakness (generalized) (M62.81)     Time: 1444-1500 PT Time Calculation (min) (ACUTE ONLY): 16 min  Charges:    $Therapeutic Activity: 8-22 mins PT General Charges $$ ACUTE PT VISIT: 1 Visit                    Rankin Essex PTA 06/28/23, 3:37 PM

## 2023-06-28 NOTE — Progress Notes (Signed)
 INFECTIOUS DISEASE PROGRESS NOTE Date of Admission:  06/21/2023     ID: Eileen Wright is a 83 y.o. female with  PNA Principal Problem:   Sepsis due to pneumonia [facility]) Active Problems:   Hyperkalemia   Hypothyroidism   COPD (chronic obstructive pulmonary disease) (HCC)   Hypertension   Acute renal failure superimposed on stage 3b chronic kidney disease (HCC)   HLD (hyperlipidemia)   Chronic diastolic CHF (congestive heart failure) (HCC)   Anemia in chronic kidney disease (CKD)   Overweight (BMI 25.0-29.9)   Acute pyelonephritis   Metabolic acidosis   Reactive thrombocytosis   Subjective: No fevers, still thick sputum. CT done, seen by SS.   ROS  Eleven systems are reviewed and negative except per hpi  Medications:  Antibiotics Given (last 72 hours)     Date/Time Action Medication Dose Rate   06/25/23 2148 New Bag/Given   doxycycline  (VIBRAMYCIN ) 100 mg in sodium chloride  0.9 % 250 mL IVPB 100 mg 125 mL/hr   06/26/23 0908 New Bag/Given   ceFEPIme  (MAXIPIME ) 2 g in sodium chloride  0.9 % 100 mL IVPB 2 g 200 mL/hr   06/26/23 1026 New Bag/Given   doxycycline  (VIBRAMYCIN ) 100 mg in sodium chloride  0.9 % 250 mL IVPB 100 mg 125 mL/hr   06/26/23 1610 Given   metroNIDAZOLE (FLAGYL) tablet 500 mg 500 mg    06/26/23 2057 New Bag/Given   ceFEPIme  (MAXIPIME ) 2 g in sodium chloride  0.9 % 100 mL IVPB 2 g 200 mL/hr   06/26/23 2101 Given   metroNIDAZOLE (FLAGYL) tablet 500 mg 500 mg    06/26/23 2248 New Bag/Given   doxycycline  (VIBRAMYCIN ) 100 mg in sodium chloride  0.9 % 250 mL IVPB 100 mg 125 mL/hr   06/27/23 0829 Given   metroNIDAZOLE (FLAGYL) tablet 500 mg 500 mg    06/27/23 0835 New Bag/Given   ceFEPIme  (MAXIPIME ) 2 g in sodium chloride  0.9 % 100 mL IVPB 2 g 200 mL/hr   06/27/23 1004 New Bag/Given   doxycycline  (VIBRAMYCIN ) 100 mg in sodium chloride  0.9 % 250 mL IVPB 100 mg 125 mL/hr   06/27/23 2125 Given   metroNIDAZOLE (FLAGYL) tablet 500 mg 500 mg    06/27/23 2134 New  Bag/Given   ceFEPIme  (MAXIPIME ) 2 g in sodium chloride  0.9 % 100 mL IVPB 2 g 200 mL/hr   06/28/23 9094 Given   metroNIDAZOLE (FLAGYL) tablet 500 mg 500 mg    06/28/23 0911 New Bag/Given   ceFEPIme  (MAXIPIME ) 2 g in sodium chloride  0.9 % 100 mL IVPB 2 g 200 mL/hr       acetylcysteine  3 mL Nebulization BID   atorvastatin   10 mg Oral QHS   budesonide -glycopyrrolate -formoterol   2 puff Inhalation BID   cholecalciferol   5,000 Units Oral Daily   heparin   5,000 Units Subcutaneous Q8H   ipratropium-albuterol   3 mL Nebulization Q6H   levothyroxine   100 mcg Oral Q0600   loratadine   10 mg Oral Daily   metoprolol  succinate  12.5 mg Oral Daily   metroNIDAZOLE  500 mg Oral Q12H   pantoprazole   40 mg Oral Daily   sodium bicarbonate   1,300 mg Oral BID    Objective: Vital signs in last 24 hours: Temp:  [97.7 F (36.5 C)-98.2 F (36.8 C)] 98.1 F (36.7 C) (06/25 0814) Pulse Rate:  [82-88] 85 (06/25 0814) Resp:  [16-18] 18 (06/25 0814) BP: (107-119)/(48-56) 107/48 (06/25 0814) SpO2:  [96 %-99 %] 98 % (06/25 0814) Weight:  [65.3 kg] 65.3 kg (06/25  0500)  onstitutional:  oriented to person, place, and time. frail HENT: [place]/AT, PERRLA, no scleral icterus Mouth/Throat: Oropharynx is clear and moist. No oropharyngeal exudate.  Cardiovascular: Normal rate, regular rhythm and normal heart sounds.2/6 sm Pulmonary/Chest: decrease bs bil bases and rhonchi Neck = supple, no nuchal rigidity Abdominal: Soft. Bowel sounds are normal.  exhibits no distension. There is no tenderness.  Lymphadenopathy: no cervical adenopathy. No axillary adenopathy Neurological: alert and oriented to person, place, and time.  Skin: Skin is warm and dry. No rash noted. No erythema.  Psychiatric: a normal mood and affect.  behavior is normal.  Lab Results Recent Labs    06/27/23 0517 06/28/23 0509  WBC 23.2* 22.0*  HGB 8.9* 8.1*  HCT 27.9* 25.1*  NA 141 139  K 3.7 3.9  CL 115* 112*  CO2 22 19*  BUN 34* 36*   CREATININE 1.16* 1.23*    Microbiology: Results for orders placed or performed during the hospital encounter of 06/21/23  Blood Culture (routine x 2)     Status: None   Collection Time: 06/21/23  7:55 PM   Specimen: BLOOD  Result Value Ref Range Status   Specimen Description BLOOD BLOOD LEFT ARM  Final   Special Requests   Final    BOTTLES DRAWN AEROBIC AND ANAEROBIC Blood Culture adequate volume   Culture   Final    NO GROWTH 5 DAYS Performed at [facility], [address] [place], [place] [address]    Report Status 06/26/2023 FINAL  Final  Blood Culture (routine x 2)     Status: Abnormal   Collection Time: 06/21/23  7:55 PM   Specimen: BLOOD RIGHT ARM  Result Value Ref Range Status   Specimen Description   Final    BLOOD RIGHT ARM Performed at [facility], [address], [place], [place] [address]    Special Requests   Final    BOTTLES DRAWN AEROBIC AND ANAEROBIC Blood Culture adequate volume Performed at [facility], [address] [place], [place] [address]    Culture  Setup Time   Final    Organism ID to follow GRAM NEGATIVE RODS ANAEROBIC BOTTLE ONLY CRITICAL RESULT CALLED TO, READ BACK BY AND VERIFIED WITH: Raquel Rodriguez guzman @1816  on 06/22/23 skl Performed at [facility], [address] [place], [place] [address]    Culture PANTOEA AGGLOMERANS (A)  Final   Report Status 06/26/2023 FINAL  Final   Organism ID, Bacteria PANTOEA AGGLOMERANS  Final      Susceptibility   Pantoea agglomerans - MIC*    CEFEPIME  <=0.12 SENSITIVE Sensitive     CEFTAZIDIME <=1 SENSITIVE Sensitive     CEFTRIAXONE  <=0.25 SENSITIVE Sensitive     CIPROFLOXACIN <=0.25 SENSITIVE Sensitive     GENTAMICIN <=1 SENSITIVE Sensitive     IMIPENEM 0.5 SENSITIVE Sensitive     TRIMETH/SULFA <=20 SENSITIVE Sensitive     PIP/TAZO >=128 RESISTANT Resistant ug/mL    * PANTOEA AGGLOMERANS  Urine Culture (for pregnant, neutropenic or urologic patients or  patients with an indwelling urinary catheter)     Status: Abnormal   Collection Time: 06/21/23  7:55 PM   Specimen: Urine, Clean Catch  Result Value Ref Range Status   Specimen Description   Final    URINE, CLEAN CATCH Performed at [facility], [address] [place], [place] [address]    Special Requests   Final    NONE Performed at [facility], [address] [place], [place] [address]    Culture MULTIPLE SPECIES PRESENT, SUGGEST RECOLLECTION (A)  Final   Report Status 06/23/2023 FINAL  Final  Blood Culture ID Panel (Reflexed)     Status: Abnormal   Collection Time: 06/21/23  7:55 PM  Result Value Ref Range Status   Enterococcus faecalis NOT DETECTED NOT DETECTED Final   Enterococcus Faecium NOT DETECTED NOT DETECTED Final   Listeria monocytogenes NOT DETECTED NOT DETECTED Final   Staphylococcus species NOT DETECTED NOT DETECTED Final   Staphylococcus aureus (BCID) NOT DETECTED NOT DETECTED Final   Staphylococcus epidermidis NOT DETECTED NOT DETECTED Final   Staphylococcus lugdunensis NOT DETECTED NOT DETECTED Final   Streptococcus species NOT DETECTED NOT DETECTED Final   Streptococcus agalactiae NOT DETECTED NOT DETECTED Final   Streptococcus pneumoniae NOT DETECTED NOT DETECTED Final   Streptococcus pyogenes NOT DETECTED NOT DETECTED Final   A.calcoaceticus-baumannii NOT DETECTED NOT DETECTED Final   Bacteroides fragilis NOT DETECTED NOT DETECTED Final   Enterobacterales DETECTED (A) NOT DETECTED Final    Comment: Enterobacterales represent a large order of gram negative bacteria, not a single organism. Refer to culture for further identification. CRITICAL RESULT CALLED TO, READ BACK BY AND VERIFIED WITH: Raquel Evern guzman @1816  on 06/22/23 skl    Enterobacter cloacae complex NOT DETECTED NOT DETECTED Final   Escherichia coli NOT DETECTED NOT DETECTED Final   Klebsiella aerogenes NOT DETECTED NOT DETECTED Final   Klebsiella oxytoca NOT DETECTED  NOT DETECTED Final   Klebsiella pneumoniae NOT DETECTED NOT DETECTED Final   Proteus species NOT DETECTED NOT DETECTED Final   Salmonella species NOT DETECTED NOT DETECTED Final   Serratia marcescens NOT DETECTED NOT DETECTED Final   Haemophilus influenzae NOT DETECTED NOT DETECTED Final   Neisseria meningitidis NOT DETECTED NOT DETECTED Final   Pseudomonas aeruginosa NOT DETECTED NOT DETECTED Final   Stenotrophomonas maltophilia NOT DETECTED NOT DETECTED Final   Candida albicans NOT DETECTED NOT DETECTED Final   Candida auris NOT DETECTED NOT DETECTED Final   Candida glabrata NOT DETECTED NOT DETECTED Final   Candida krusei NOT DETECTED NOT DETECTED Final   Candida parapsilosis NOT DETECTED NOT DETECTED Final   Candida tropicalis NOT DETECTED NOT DETECTED Final   Cryptococcus neoformans/gattii NOT DETECTED NOT DETECTED Final   CTX-M ESBL NOT DETECTED NOT DETECTED Final   Carbapenem resistance IMP NOT DETECTED NOT DETECTED Final   Carbapenem resistance KPC NOT DETECTED NOT DETECTED Final   Carbapenem resistance NDM NOT DETECTED NOT DETECTED Final   Carbapenem resist OXA 48 LIKE NOT DETECTED NOT DETECTED Final   Carbapenem resistance VIM NOT DETECTED NOT DETECTED Final    Comment: Performed at [facility], [address] [place], [place] [address]  Expectorated Sputum Assessment w Gram Stain, Rflx to Resp Cult     Status: None   Collection Time: 06/22/23 12:20 AM   Specimen: Urine, Clean Catch; Sputum  Result Value Ref Range Status   Specimen Description SPU  Final   Special Requests NONE  Final   Sputum evaluation   Final    THIS SPECIMEN IS ACCEPTABLE FOR SPUTUM CULTURE Performed at [facility], [address] [place], [place] [address]    Report Status 06/22/2023 FINAL  Final  Resp panel by RT-PCR (RSV, Flu A&B, Covid) Urine, Clean Catch     Status: None   Collection Time: 06/22/23 12:20 AM   Specimen: Urine, Clean Catch; Nasal Swab  Result Value Ref  Range Status   SARS Coronavirus 2  by RT PCR NEGATIVE NEGATIVE Final    Comment: (NOTE) SARS-CoV-2 target nucleic acids are NOT DETECTED.  The SARS-CoV-2 RNA is generally detectable in upper respiratory specimens during the acute phase of infection. The lowest concentration of SARS-CoV-2 viral copies this assay can detect is 138 copies/mL. A negative result does not preclude SARS-Cov-2 infection and should not be used as the sole basis for treatment or other patient management decisions. A negative result may occur with  improper specimen collection/handling, submission of specimen other than nasopharyngeal swab, presence of viral mutation(s) within the areas targeted by this assay, and inadequate number of viral copies(<138 copies/mL). A negative result must be combined with clinical observations, patient history, and epidemiological information. The expected result is Negative.  Fact Sheet for Patients:  BloggerCourse.com  Fact Sheet for Healthcare Providers:  SeriousBroker.it  This test is no t yet approved or cleared by the [place]  FDA and  has been authorized for detection and/or diagnosis of SARS-CoV-2 by FDA under an Emergency Use Authorization (EUA). This EUA will remain  in effect (meaning this test can be used) for the duration of the COVID-19 declaration under Section 564(b)(1) of the Act, 21 U.S.C.section 360bbb-3(b)(1), unless the authorization is terminated  or revoked sooner.       Influenza A by PCR NEGATIVE NEGATIVE Final   Influenza B by PCR NEGATIVE NEGATIVE Final    Comment: (NOTE) The Xpert Xpress SARS-CoV-2/FLU/RSV plus assay is intended as an aid in the diagnosis of influenza from Nasopharyngeal swab specimens and should not be used as a sole basis for treatment. Nasal washings and aspirates are unacceptable for Xpert Xpress SARS-CoV-2/FLU/RSV testing.  Fact Sheet for  Patients: BloggerCourse.com  Fact Sheet for Healthcare Providers: SeriousBroker.it  This test is not yet approved or cleared by the [place]  FDA and has been authorized for detection and/or diagnosis of SARS-CoV-2 by FDA under an Emergency Use Authorization (EUA). This EUA will remain in effect (meaning this test can be used) for the duration of the COVID-19 declaration under Section 564(b)(1) of the Act, 21 U.S.C. section 360bbb-3(b)(1), unless the authorization is terminated or revoked.     Resp Syncytial Virus by PCR NEGATIVE NEGATIVE Final    Comment: (NOTE) Fact Sheet for Patients: BloggerCourse.com  Fact Sheet for Healthcare Providers: SeriousBroker.it  This test is not yet approved or cleared by the [place]  FDA and has been authorized for detection and/or diagnosis of SARS-CoV-2 by FDA under an Emergency Use Authorization (EUA). This EUA will remain in effect (meaning this test can be used) for the duration of the COVID-19 declaration under Section 564(b)(1) of the Act, 21 U.S.C. section 360bbb-3(b)(1), unless the authorization is terminated or revoked.  Performed at [facility], [address] [place], [place] [address]   Culture, Respiratory w Gram Stain     Status: None   Collection Time: 06/22/23 12:20 AM   Specimen: Sputum  Result Value Ref Range Status   Specimen Description   Final    SPU Performed at [facility], [address] [place], [place] [address]    Special Requests   Final    NONE Reflexed from [id number] Performed at [facility], [address] [place], [place] [address]    Gram Stain   Final    ABUNDANT WBC PRESENT, PREDOMINANTLY PMN ABUNDANT SQUAMOUS EPITHELIAL CELLS PRESENT ABUNDANT GRAM POSITIVE COCCI ABUNDANT GRAM POSITIVE RODS MODERATE GRAM NEGATIVE RODS Performed at [facility], [address] [place],  [place] [address]    Culture   Final    ABUNDANT PSEUDOMONAS AERUGINOSA ABUNDANT SERRATIA LIQUEFACIENS Two isolates with different morphologies were identified as the same organism.The most resistant organism was reported. FOR PSEUDOMONAS AERUGINOSA ABUNDANT STAPHYLOCOCCUS LUGDUNENSIS    Report Status 06/26/2023 FINAL  Final   Organism ID, Bacteria PSEUDOMONAS AERUGINOSA  Final   Organism ID, Bacteria SERRATIA LIQUEFACIENS  Final   Organism ID, Bacteria STAPHYLOCOCCUS LUGDUNENSIS  Final      Susceptibility   Pseudomonas aeruginosa - MIC*    CEFTAZIDIME <=1 SENSITIVE Sensitive     CIPROFLOXACIN <=0.25 SENSITIVE Sensitive     GENTAMICIN 2 SENSITIVE Sensitive     IMIPENEM 1 SENSITIVE Sensitive     PIP/TAZO <=4 SENSITIVE Sensitive ug/mL    CEFEPIME  2 SENSITIVE Sensitive     * ABUNDANT PSEUDOMONAS AERUGINOSA   Serratia liquefaciens - MIC*    CEFEPIME  <=0.12 SENSITIVE Sensitive     CEFTAZIDIME <=1 SENSITIVE Sensitive     CEFTRIAXONE  <=0.25 SENSITIVE Sensitive     CIPROFLOXACIN <=0.25 SENSITIVE Sensitive     GENTAMICIN <=1 SENSITIVE Sensitive     IMIPENEM 1 SENSITIVE Sensitive     TRIMETH/SULFA <=20 SENSITIVE Sensitive     PIP/TAZO <=4 SENSITIVE Sensitive ug/mL    * ABUNDANT SERRATIA LIQUEFACIENS   Staphylococcus lugdunensis - MIC*    CIPROFLOXACIN <=0.5 SENSITIVE Sensitive     ERYTHROMYCIN <=0.25 SENSITIVE Sensitive     GENTAMICIN <=0.5 SENSITIVE Sensitive     OXACILLIN 2 SENSITIVE Sensitive     TETRACYCLINE <=1 SENSITIVE Sensitive     VANCOMYCIN <=0.5 SENSITIVE Sensitive     TRIMETH/SULFA <=10 SENSITIVE Sensitive     CLINDAMYCIN <=0.25 SENSITIVE Sensitive     RIFAMPIN <=0.5 SENSITIVE Sensitive     Inducible Clindamycin NEGATIVE Sensitive     * ABUNDANT STAPHYLOCOCCUS LUGDUNENSIS     Studies/Results: CT CHEST WO CONTRAST Result Date: 06/27/2023 CLINICAL DATA:  Pneumonia. EXAM: CT CHEST WITHOUT CONTRAST TECHNIQUE: Multidetector CT imaging of the chest was  performed following the standard protocol without IV contrast. RADIATION DOSE REDUCTION: This exam was performed according to the departmental dose-optimization program which includes automated exposure control, adjustment of the mA and/or kV according to patient size and/or use of iterative reconstruction technique. COMPARISON:  Chest CT dated struck by chest radiograph dated 06/21/2023. FINDINGS: Evaluation of this exam is limited in the absence of intravenous contrast. Cardiovascular: Top-normal cardiac size. No pericardial effusion. Three-vessel coronary vascular calcification and calcification of the mitral annulus. Mild atherosclerotic calcification of the thoracic aorta. No aneurysmal dilatation. The central pulmonary arteries are grossly unremarkable. Mediastinum/Nodes: No obvious hilar adenopathy. Evaluation however is limited in the absence of intravenous contrast and due to consolidative changes of the lungs. The esophagus is grossly unremarkable. No mediastinal fluid collection. Lungs/Pleura: Large area of consolidation involving the right lower and right middle lobes as well as areas of consolidation at the left lung base. There is occlusion of the right middle and right lower lobe bronchi likely with mucous impaction or aspirated content. An endobronchial lesion is not excluded. Several ground-glass nodules in the left lower lobe as well as left upper lobe measure up to 9 mm, likely inflammatory to pressures in etiology. Small bilateral pleural effusions. No pneumothorax. Upper Abdomen: No acute abnormality. Musculoskeletal: Osteopenia with degenerative changes. No acute osseous pathology. IMPRESSION: 1. Occlusion of the right middle and right lower lobe bronchi secondary to mucous impaction or aspirated content with large area of postobstructive atelectasis or infiltrate. Follow-up to resolution recommended  to exclude underlying mass. 2. Left lung base density as well as scattered ground-glass nodules  in the left lung likely inflammatory/infectious in etiology. Follow-up to resolution after treatment recommended. 3. Small bilateral pleural effusions. 4.  Aortic Atherosclerosis (ICD10-I70.0). Electronically Signed   By: Vanetta Chou M.D.   On: 06/27/2023 15:53    Assessment/Plan: Eileen Wright is a 83 y.o. female with multiple med issues admitted with cough, dysuria, elevated lactate, elevated wbc. UA +, CT renal neg for pyelo but with RLL PNA. 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   6/25 CT with RML and RLL mucus impaction. WBC 22  Recommendations Pneumonia- extensive esp R sided based on CT abd. Reports thick purulent sputum which has persisted. Sputum CX Serratia, Pseudomonas and staph lugdenesis. Persistently elevated wbc.  She also reports hx of recurrent cough and sputum off and on so suspect possible underlying bronchiectasis. Has been seen by SS and no evidence aspiration Cont cefepime , stopped doxy since  Staph lugdenesis oxacillin S Cont  flagyl for anaerobes  Will need to work on mucus clearance- incentive spirometery, duoneb and mucomyst starting today  If improving can consider change to orals to complete course once ready for dc   Pantoea agglomerans bacteremia 1/2- clinically not septic and currently covered and would not need further work up as low likelihood to cause metastatic disease or endocarditis   UTI - cx mixed, UA +. CT with no evidence pyelo. Should be adequately covered.  Thank you very much for the consult. Will follow with you.   Thank you very much for the consult. Will follow with you.  Alm SHAUNNA Needle   06/28/2023, 2:01 PM

## 2023-06-28 NOTE — Plan of Care (Signed)
   Problem: Clinical Measurements: Goal: Will remain free from infection Outcome: Progressing Goal: Respiratory complications will improve Outcome: Progressing

## 2023-06-29 ENCOUNTER — Inpatient Hospital Stay

## 2023-06-29 DIAGNOSIS — A419 Sepsis, unspecified organism: Secondary | ICD-10-CM | POA: Diagnosis not present

## 2023-06-29 DIAGNOSIS — N179 Acute kidney failure, unspecified: Secondary | ICD-10-CM | POA: Diagnosis not present

## 2023-06-29 DIAGNOSIS — J449 Chronic obstructive pulmonary disease, unspecified: Secondary | ICD-10-CM | POA: Diagnosis not present

## 2023-06-29 DIAGNOSIS — J189 Pneumonia, unspecified organism: Secondary | ICD-10-CM | POA: Diagnosis not present

## 2023-06-29 LAB — CBC
HCT: 25.2 % — ABNORMAL LOW (ref 36.0–46.0)
Hemoglobin: 8 g/dL — ABNORMAL LOW (ref 12.0–15.0)
MCH: 29.3 pg (ref 26.0–34.0)
MCHC: 31.7 g/dL (ref 30.0–36.0)
MCV: 92.3 fL (ref 80.0–100.0)
Platelets: 445 10*3/uL — ABNORMAL HIGH (ref 150–400)
RBC: 2.73 MIL/uL — ABNORMAL LOW (ref 3.87–5.11)
RDW: 15.3 % (ref 11.5–15.5)
WBC: 21.7 10*3/uL — ABNORMAL HIGH (ref 4.0–10.5)
nRBC: 0 % (ref 0.0–0.2)

## 2023-06-29 LAB — BASIC METABOLIC PANEL WITH GFR
Anion gap: 8 (ref 5–15)
BUN: 39 mg/dL — ABNORMAL HIGH (ref 8–23)
CO2: 20 mmol/L — ABNORMAL LOW (ref 22–32)
Calcium: 10 mg/dL (ref 8.9–10.3)
Chloride: 110 mmol/L (ref 98–111)
Creatinine, Ser: 1.37 mg/dL — ABNORMAL HIGH (ref 0.44–1.00)
GFR, Estimated: 39 mL/min — ABNORMAL LOW (ref >60)
Glucose, Bld: 83 mg/dL (ref 70–99)
Potassium: 3.8 mmol/L (ref 3.5–5.1)
Sodium: 138 mmol/L (ref 135–145)

## 2023-06-29 LAB — IRON AND TIBC
Iron: 49 ug/dL (ref 28–170)
Saturation Ratios: 41 % — ABNORMAL HIGH (ref 10.4–31.8)
TIBC: 120 ug/dL — ABNORMAL LOW (ref 250–450)
UIBC: 71 ug/dL

## 2023-06-29 LAB — FERRITIN: Ferritin: 309 ng/mL — ABNORMAL HIGH (ref 11–307)

## 2023-06-29 LAB — MAGNESIUM: Magnesium: 1.4 mg/dL — ABNORMAL LOW (ref 1.7–2.4)

## 2023-06-29 LAB — VITAMIN B12: Vitamin B-12: 911 pg/mL (ref 180–914)

## 2023-06-29 MED ORDER — SENNOSIDES-DOCUSATE SODIUM 8.6-50 MG PO TABS
2.0000 | ORAL_TABLET | Freq: Two times a day (BID) | ORAL | Status: DC
  Administered 2023-06-29 – 2023-07-04 (×11): 2 via ORAL
  Filled 2023-06-29 (×12): qty 2

## 2023-06-29 MED ORDER — MAGNESIUM SULFATE 4 GM/100ML IV SOLN
4.0000 g | Freq: Once | INTRAVENOUS | Status: AC
  Administered 2023-06-29: 4 g via INTRAVENOUS
  Filled 2023-06-29: qty 100

## 2023-06-29 MED ORDER — MELATONIN 5 MG PO TABS
5.0000 mg | ORAL_TABLET | Freq: Every day | ORAL | Status: DC
  Administered 2023-06-29 – 2023-07-03 (×5): 5 mg via ORAL
  Filled 2023-06-29 (×5): qty 1

## 2023-06-29 MED ORDER — PREDNISONE 20 MG PO TABS
40.0000 mg | ORAL_TABLET | Freq: Every day | ORAL | Status: DC
  Administered 2023-06-29 – 2023-07-02 (×4): 40 mg via ORAL
  Filled 2023-06-29 (×4): qty 2

## 2023-06-29 MED ORDER — LACTULOSE 10 GM/15ML PO SOLN
20.0000 g | Freq: Once | ORAL | Status: AC
  Administered 2023-06-29: 20 g via ORAL
  Filled 2023-06-29: qty 30

## 2023-06-29 NOTE — Progress Notes (Signed)
 Progress Note   Patient: Eileen Wright FMW:[id number] DOB: [birth year] DOA: 06/21/2023     7 DOS: the patient was seen and examined on 06/29/2023   Brief hospital course: CARMELINE KOWAL is a 83 y.o. female with medical history significant of COPD, HTN, HLD, dCHF, hypothyroidism, CKD 3B, anemia, former smoker, who presents with cough with greenish sputum production and SOB for 3 days. Patient also reports dysuria and burning micturition with left flank pain.  On presentation patient had mild tachycardia and tachypnea, labs with leukocytosis at 20.9, BNP 500, lactic acid 1.1, CO2 of 12, potassium 5.2, AKI with creatinine at 3.37, BUN 105, recent baseline creatinine of 2.23 on 05/24/2023. Chest x-ray with bilateral lower lobe opacity and small bilateral pleural effusions.  Patient also has a profound elevation of procalcitonin level of 53.47.  Patient is seen by ID, started on cefepime .  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  Sputum culture grow Pseudomonas, Serratia and Staphylococcus Lugdunensis.  Chest CT scan was performed on 6/27, showed a mucous plug in the right upper and middle lobe bronchi.  With consolidation. Patient was started on DuoNeb, Mucomyst.   Principal Problem:   Sepsis due to pneumonia [facility]) Active Problems:   COPD (chronic obstructive pulmonary disease) (HCC)   Acute pyelonephritis   Chronic diastolic CHF (congestive heart failure) (HCC)   Hypothyroidism   Hypertension   Metabolic acidosis   Acute renal failure superimposed on stage 3b chronic kidney disease (HCC)   HLD (hyperlipidemia)   Anemia in chronic kidney disease (CKD)   Hyperkalemia   Overweight (BMI 25.0-29.9)   Reactive thrombocytosis   Assessment and Plan:  Sepsis due to pneumonia (HCC) Right sided aspiration pneumonia. Patient does not have hypoxemia.  She has a profound elevation of procalcitonin level, which has dropped down to 0.3 after antibiotic treatment.   Still has persistent leukocytosis. Based on CT scan, patient has significant mucous plug, patient also coughed up a large amount of mucus.  I started him DuoNeb and Mucomyst. Appreciate ID consult.  Treated with cefepime  and Flagyl. Patient continued to cough up significant amount of mucus, repeat chest x-ray today to make sure pneumonia is getting better. Patient also need to follow-up with pulmonology as outpatient to repeat a CT scan to make sure patient does not have airway obstruction from malignancy. Patient also has some wheezes today, started the prednisone.  Pantoea agglomerans bacteremia. Followed by ID, covered with cefepime .  No additional workup is recommended.   Acute pyelonephritis Patient has a positive urinalysis, typical symptoms for urinary infection, with left flank pain, indicating acute pyelonephritis.  CT renal stone study was without any acute abnormality. -Urine cultures with multiple species On cefepime .   Acute renal failure superimposed on stage 3b chronic kidney disease (HCC) Metabolic acidosis. Hypokalemia. Hypomagnesemia. Likely secondary to concern of sepsis and continuation of diuretics. Renal function has improved, continue sodium bicarb for mild metabolic acidosis.  Give 4 g of magnesium  sulfate for hypomagnesemia.   COPD (chronic obstructive pulmonary disease) (HCC) No exacerbation. Had some wheezes after nebulization, added prednisone.   Chronic diastolic CHF (congestive heart failure) (HCC) 2D echo 02/01/2022 showed EF > 55% with grade 1 diastolic dysfunction, patient has mild fluid overload as discussed above. BNP elevated at 500, but her creatinine was 3.37 at that time.  Patient clinically does not have volume overload.  No shortness of breath or hypoxemia.      Hypothyroidism - Continue home Synthroid    Hypertension Continue  to monitor blood pressure.   HLD (hyperlipidemia) - Continue the home Lipitor   Anemia in chronic kidney disease  (CKD) Reactive thrombocytosis. Hemoglobin 8.0 today, no iron deficiency, B12 pending.   Overweight (BMI 25.0-29.9) Body weight 66.7 kg, BMI 26.90 - Encourage losing weight - Exercise healthy diet         Subjective:  Patient still has significant short of breath with exertion, cough, still has significant amount of mucus production.  Physical Exam: Vitals:   06/29/23 0403 06/29/23 0500 06/29/23 0812 06/29/23 0832  BP: (!) 108/52   (!) 116/50  Pulse: 92   96  Resp: 20   16  Temp: 98.4 F (36.9 C)   98.3 F (36.8 C)  TempSrc: Oral   Oral  SpO2: 97%  99% 98%  Weight:  65.4 kg    Height:       General exam: Appears calm and comfortable  Respiratory system: Decreased breath sounds on right, a few wheezes. Respiratory effort normal. Cardiovascular system: S1 & S2 heard, RRR. No JVD, murmurs, rubs, gallops or clicks. No pedal edema. Gastrointestinal system: Abdomen is nondistended, soft and nontender. No organomegaly or masses felt. Normal bowel sounds heard. Central nervous system: Alert and oriented x3. No focal neurological deficits. Extremities: Symmetric 5 x 5 power. Skin: No rashes, lesions or ulcers Psychiatry: Mood & affect appropriate.     Data Reviewed:  Lab results reviewed.  Family Communication: daughter updated over the phone  Disposition: Status is: Inpatient Remains inpatient appropriate because: Severity of disease, IV treatment.     Time spent: 51 minutes  Author: Murvin Mana, MD 06/29/2023 11:12 AM  For on call review [email or web address].

## 2023-06-29 NOTE — Plan of Care (Signed)
°  Problem: Clinical Measurements: Goal: Will remain free from infection Outcome: Progressing   Problem: Activity: Goal: Risk for activity intolerance will decrease Outcome: Progressing   Problem: Nutrition: Goal: Adequate nutrition will be maintained Outcome: Progressing

## 2023-06-29 NOTE — Plan of Care (Signed)
  Problem: Clinical Measurements: Goal: Respiratory complications will improve Outcome: Progressing   Problem: Clinical Measurements: Goal: Will remain free from infection Outcome: Progressing   

## 2023-06-29 NOTE — Progress Notes (Signed)
 INFECTIOUS DISEASE PROGRESS NOTE Date of Admission:  06/21/2023     ID: Eileen Wright is a 83 y.o. female with  PNA Principal Problem:   Sepsis due to pneumonia [facility]) Active Problems:   Hyperkalemia   Hypothyroidism   COPD (chronic obstructive pulmonary disease) (HCC)   Hypertension   Acute renal failure superimposed on stage 3b chronic kidney disease (HCC)   HLD (hyperlipidemia)   Chronic diastolic CHF (congestive heart failure) (HCC)   Anemia in chronic kidney disease (CKD)   Overweight (BMI 25.0-29.9)   Acute pyelonephritis   Metabolic acidosis   Reactive thrombocytosis   Subjective: No fevers, using flutter valve and some increased  thick sputum.   ROS  Eleven systems are reviewed and negative except per hpi  Medications:  Antibiotics Given (last 72 hours)     Date/Time Action Medication Dose Rate   06/26/23 1610 Given   metroNIDAZOLE  (FLAGYL ) tablet 500 mg 500 mg    06/26/23 2057 New Bag/Given   ceFEPIme  (MAXIPIME ) 2 g in sodium chloride  0.9 % 100 mL IVPB 2 g 200 mL/hr   06/26/23 2101 Given   metroNIDAZOLE  (FLAGYL ) tablet 500 mg 500 mg    06/26/23 2248 New Bag/Given   doxycycline  (VIBRAMYCIN ) 100 mg in sodium chloride  0.9 % 250 mL IVPB 100 mg 125 mL/hr   06/27/23 0829 Given   metroNIDAZOLE  (FLAGYL ) tablet 500 mg 500 mg    06/27/23 0835 New Bag/Given   ceFEPIme  (MAXIPIME ) 2 g in sodium chloride  0.9 % 100 mL IVPB 2 g 200 mL/hr   06/27/23 1004 New Bag/Given   doxycycline  (VIBRAMYCIN ) 100 mg in sodium chloride  0.9 % 250 mL IVPB 100 mg 125 mL/hr   06/27/23 2125 Given   metroNIDAZOLE  (FLAGYL ) tablet 500 mg 500 mg    06/27/23 2134 New Bag/Given   ceFEPIme  (MAXIPIME ) 2 g in sodium chloride  0.9 % 100 mL IVPB 2 g 200 mL/hr   06/28/23 9094 Given   metroNIDAZOLE  (FLAGYL ) tablet 500 mg 500 mg    06/28/23 0911 New Bag/Given   ceFEPIme  (MAXIPIME ) 2 g in sodium chloride  0.9 % 100 mL IVPB 2 g 200 mL/hr   06/28/23 2110 Given   metroNIDAZOLE  (FLAGYL ) tablet 500 mg 500 mg     06/28/23 2113 New Bag/Given   ceFEPIme  (MAXIPIME ) 2 g in sodium chloride  0.9 % 100 mL IVPB 2 g 200 mL/hr   06/29/23 9090 Given   metroNIDAZOLE  (FLAGYL ) tablet 500 mg 500 mg    06/29/23 1233 New Bag/Given   ceFEPIme  (MAXIPIME ) 2 g in sodium chloride  0.9 % 100 mL IVPB 2 g 200 mL/hr       acetylcysteine   3 mL Nebulization BID   atorvastatin   10 mg Oral QHS   budesonide -glycopyrrolate -formoterol   2 puff Inhalation BID   cholecalciferol   5,000 Units Oral Daily   heparin   5,000 Units Subcutaneous Q8H   ipratropium-albuterol   3 mL Nebulization TID   levothyroxine   100 mcg Oral Q0600   loratadine   10 mg Oral Daily   metoprolol  succinate  12.5 mg Oral Daily   metroNIDAZOLE   500 mg Oral Q12H   pantoprazole   40 mg Oral Daily   predniSONE   40 mg Oral Q breakfast   sodium bicarbonate   1,300 mg Oral BID    Objective: Vital signs in last 24 hours: Temp:  [97.9 F (36.6 C)-98.4 F (36.9 C)] 98.3 F (36.8 C) (06/26 0832) Pulse Rate:  [92-102] 96 (06/26 0832) Resp:  [15-20] 16 (06/26 0832) BP: (108-119)/(50-56) 116/50 (06/26 9167)  SpO2:  [97 %-99 %] 98 % (06/26 0832) Weight:  [65.4 kg] 65.4 kg (06/26 0500)  onstitutional:  oriented to person, place, and time. frail HENT: [place]/AT, PERRLA, no scleral icterus Mouth/Throat: Oropharynx is clear and moist. No oropharyngeal exudate.  Cardiovascular: Normal rate, regular rhythm and normal heart sounds.2/6 sm Pulmonary/Chest: decrease bs bil bases and rhonchi Neck = supple, no nuchal rigidity Abdominal: Soft. Bowel sounds are normal.  exhibits no distension. There is no tenderness.  Lymphadenopathy: no cervical adenopathy. No axillary adenopathy Neurological: alert and oriented to person, place, and time.  Skin: Skin is warm and dry. No rash noted. No erythema.  Psychiatric: a normal mood and affect.  behavior is normal.  Lab Results Recent Labs    06/28/23 0509 06/29/23 0302  WBC 22.0* 21.7*  HGB 8.1* 8.0*  HCT 25.1* 25.2*  NA 139 138  K  3.9 3.8  CL 112* 110  CO2 19* 20*  BUN 36* 39*  CREATININE 1.23* 1.37*    Microbiology: Results for orders placed or performed during the hospital encounter of 06/21/23  Blood Culture (routine x 2)     Status: None   Collection Time: 06/21/23  7:55 PM   Specimen: BLOOD  Result Value Ref Range Status   Specimen Description BLOOD BLOOD LEFT ARM  Final   Special Requests   Final    BOTTLES DRAWN AEROBIC AND ANAEROBIC Blood Culture adequate volume   Culture   Final    NO GROWTH 5 DAYS Performed at [facility], [address] [place], [place] [address]    Report Status 06/26/2023 FINAL  Final  Blood Culture (routine x 2)     Status: Abnormal   Collection Time: 06/21/23  7:55 PM   Specimen: BLOOD RIGHT ARM  Result Value Ref Range Status   Specimen Description   Final    BLOOD RIGHT ARM Performed at [facility], [address], [place], [place] [address]    Special Requests   Final    BOTTLES DRAWN AEROBIC AND ANAEROBIC Blood Culture adequate volume Performed at [facility], [address] [place], [place] [address]    Culture  Setup Time   Final    Organism ID to follow GRAM NEGATIVE RODS ANAEROBIC BOTTLE ONLY CRITICAL RESULT CALLED TO, READ BACK BY AND VERIFIED WITH: Raquel Rodriguez guzman @1816  on 06/22/23 skl Performed at [facility], [address] [place], [place] [address]    Culture PANTOEA AGGLOMERANS (A)  Final   Report Status 06/26/2023 FINAL  Final   Organism ID, Bacteria PANTOEA AGGLOMERANS  Final      Susceptibility   Pantoea agglomerans - MIC*    CEFEPIME  <=0.12 SENSITIVE Sensitive     CEFTAZIDIME <=1 SENSITIVE Sensitive     CEFTRIAXONE  <=0.25 SENSITIVE Sensitive     CIPROFLOXACIN <=0.25 SENSITIVE Sensitive     GENTAMICIN <=1 SENSITIVE Sensitive     IMIPENEM 0.5 SENSITIVE Sensitive     TRIMETH/SULFA <=20 SENSITIVE Sensitive     PIP/TAZO >=128 RESISTANT Resistant ug/mL    * PANTOEA AGGLOMERANS  Urine Culture (for  pregnant, neutropenic or urologic patients or patients with an indwelling urinary catheter)     Status: Abnormal   Collection Time: 06/21/23  7:55 PM   Specimen: Urine, Clean Catch  Result Value Ref Range Status   Specimen Description   Final    URINE, CLEAN CATCH Performed at [facility], [address] [place], [place] [address]    Special  Requests   Final    NONE Performed at [facility], [address] [place], [place] [address]    Culture MULTIPLE SPECIES PRESENT, SUGGEST RECOLLECTION (A)  Final   Report Status 06/23/2023 FINAL  Final  Blood Culture ID Panel (Reflexed)     Status: Abnormal   Collection Time: 06/21/23  7:55 PM  Result Value Ref Range Status   Enterococcus faecalis NOT DETECTED NOT DETECTED Final   Enterococcus Faecium NOT DETECTED NOT DETECTED Final   Listeria monocytogenes NOT DETECTED NOT DETECTED Final   Staphylococcus species NOT DETECTED NOT DETECTED Final   Staphylococcus aureus (BCID) NOT DETECTED NOT DETECTED Final   Staphylococcus epidermidis NOT DETECTED NOT DETECTED Final   Staphylococcus lugdunensis NOT DETECTED NOT DETECTED Final   Streptococcus species NOT DETECTED NOT DETECTED Final   Streptococcus agalactiae NOT DETECTED NOT DETECTED Final   Streptococcus pneumoniae NOT DETECTED NOT DETECTED Final   Streptococcus pyogenes NOT DETECTED NOT DETECTED Final   A.calcoaceticus-baumannii NOT DETECTED NOT DETECTED Final   Bacteroides fragilis NOT DETECTED NOT DETECTED Final   Enterobacterales DETECTED (A) NOT DETECTED Final    Comment: Enterobacterales represent a large order of gram negative bacteria, not a single organism. Refer to culture for further identification. CRITICAL RESULT CALLED TO, READ BACK BY AND VERIFIED WITH: Raquel Evern guzman @1816  on 06/22/23 skl    Enterobacter cloacae complex NOT DETECTED NOT DETECTED Final   Escherichia coli NOT DETECTED NOT DETECTED Final   Klebsiella aerogenes NOT DETECTED NOT  DETECTED Final   Klebsiella oxytoca NOT DETECTED NOT DETECTED Final   Klebsiella pneumoniae NOT DETECTED NOT DETECTED Final   Proteus species NOT DETECTED NOT DETECTED Final   Salmonella species NOT DETECTED NOT DETECTED Final   Serratia marcescens NOT DETECTED NOT DETECTED Final   Haemophilus influenzae NOT DETECTED NOT DETECTED Final   Neisseria meningitidis NOT DETECTED NOT DETECTED Final   Pseudomonas aeruginosa NOT DETECTED NOT DETECTED Final   Stenotrophomonas maltophilia NOT DETECTED NOT DETECTED Final   Candida albicans NOT DETECTED NOT DETECTED Final   Candida auris NOT DETECTED NOT DETECTED Final   Candida glabrata NOT DETECTED NOT DETECTED Final   Candida krusei NOT DETECTED NOT DETECTED Final   Candida parapsilosis NOT DETECTED NOT DETECTED Final   Candida tropicalis NOT DETECTED NOT DETECTED Final   Cryptococcus neoformans/gattii NOT DETECTED NOT DETECTED Final   CTX-M ESBL NOT DETECTED NOT DETECTED Final   Carbapenem resistance IMP NOT DETECTED NOT DETECTED Final   Carbapenem resistance KPC NOT DETECTED NOT DETECTED Final   Carbapenem resistance NDM NOT DETECTED NOT DETECTED Final   Carbapenem resist OXA 48 LIKE NOT DETECTED NOT DETECTED Final   Carbapenem resistance VIM NOT DETECTED NOT DETECTED Final    Comment: Performed at [facility], [address] [place], [place] [address]  Expectorated Sputum Assessment w Gram Stain, Rflx to Resp Cult     Status: None   Collection Time: 06/22/23 12:20 AM   Specimen: Urine, Clean Catch; Sputum  Result Value Ref Range Status   Specimen Description SPU  Final   Special Requests NONE  Final   Sputum evaluation   Final    THIS SPECIMEN IS ACCEPTABLE FOR SPUTUM CULTURE Performed at [facility], [address] [place], [place] [address]    Report Status 06/22/2023 FINAL  Final  Resp panel by RT-PCR (RSV, Flu A&B, Covid) Urine, Clean Catch     Status: None   Collection Time: 06/22/23 12:20 AM   Specimen:  Urine, Clean Catch; Nasal Swab  Result Value Ref Range Status   SARS Coronavirus 2 by RT PCR NEGATIVE NEGATIVE Final    Comment: (NOTE) SARS-CoV-2 target nucleic acids are NOT DETECTED.  The SARS-CoV-2 RNA is generally detectable in upper respiratory specimens during the acute phase of infection. The lowest concentration of SARS-CoV-2 viral copies this assay can detect is 138 copies/mL. A negative result does not preclude SARS-Cov-2 infection and should not be used as the sole basis for treatment or other patient management decisions. A negative result may occur with  improper specimen collection/handling, submission of specimen other than nasopharyngeal swab, presence of viral mutation(s) within the areas targeted by this assay, and inadequate number of viral copies(<138 copies/mL). A negative result must be combined with clinical observations, patient history, and epidemiological information. The expected result is Negative.  Fact Sheet for Patients:  BloggerCourse.com  Fact Sheet for Healthcare Providers:  SeriousBroker.it  This test is no t yet approved or cleared by the [place]  FDA and  has been authorized for detection and/or diagnosis of SARS-CoV-2 by FDA under an Emergency Use Authorization (EUA). This EUA will remain  in effect (meaning this test can be used) for the duration of the COVID-19 declaration under Section 564(b)(1) of the Act, 21 U.S.C.section 360bbb-3(b)(1), unless the authorization is terminated  or revoked sooner.       Influenza A by PCR NEGATIVE NEGATIVE Final   Influenza B by PCR NEGATIVE NEGATIVE Final    Comment: (NOTE) The Xpert Xpress SARS-CoV-2/FLU/RSV plus assay is intended as an aid in the diagnosis of influenza from Nasopharyngeal swab specimens and should not be used as a sole basis for treatment. Nasal washings and aspirates are unacceptable for Xpert Xpress  SARS-CoV-2/FLU/RSV testing.  Fact Sheet for Patients: BloggerCourse.com  Fact Sheet for Healthcare Providers: SeriousBroker.it  This test is not yet approved or cleared by the [place]  FDA and has been authorized for detection and/or diagnosis of SARS-CoV-2 by FDA under an Emergency Use Authorization (EUA). This EUA will remain in effect (meaning this test can be used) for the duration of the COVID-19 declaration under Section 564(b)(1) of the Act, 21 U.S.C. section 360bbb-3(b)(1), unless the authorization is terminated or revoked.     Resp Syncytial Virus by PCR NEGATIVE NEGATIVE Final    Comment: (NOTE) Fact Sheet for Patients: BloggerCourse.com  Fact Sheet for Healthcare Providers: SeriousBroker.it  This test is not yet approved or cleared by the [place]  FDA and has been authorized for detection and/or diagnosis of SARS-CoV-2 by FDA under an Emergency Use Authorization (EUA). This EUA will remain in effect (meaning this test can be used) for the duration of the COVID-19 declaration under Section 564(b)(1) of the Act, 21 U.S.C. section 360bbb-3(b)(1), unless the authorization is terminated or revoked.  Performed at [facility], [address] [place], [place] [address]   Culture, Respiratory w Gram Stain     Status: None   Collection Time: 06/22/23 12:20 AM   Specimen: Sputum  Result Value Ref Range Status   Specimen Description   Final    SPU Performed at [facility], [address] [place], [place] [address]    Special Requests   Final    NONE Reflexed from [id number] Performed at [facility], [address] [place], [place] [address]    Gram Stain   Final    ABUNDANT WBC PRESENT, PREDOMINANTLY PMN ABUNDANT SQUAMOUS EPITHELIAL CELLS PRESENT ABUNDANT GRAM POSITIVE COCCI ABUNDANT GRAM POSITIVE RODS  MODERATE GRAM NEGATIVE  RODS Performed at [facility], [address], [place], [place] [address]    Culture   Final    ABUNDANT PSEUDOMONAS AERUGINOSA ABUNDANT SERRATIA LIQUEFACIENS Two isolates with different morphologies were identified as the same organism.The most resistant organism was reported. FOR PSEUDOMONAS AERUGINOSA ABUNDANT STAPHYLOCOCCUS LUGDUNENSIS    Report Status 06/26/2023 FINAL  Final   Organism ID, Bacteria PSEUDOMONAS AERUGINOSA  Final   Organism ID, Bacteria SERRATIA LIQUEFACIENS  Final   Organism ID, Bacteria STAPHYLOCOCCUS LUGDUNENSIS  Final      Susceptibility   Pseudomonas aeruginosa - MIC*    CEFTAZIDIME <=1 SENSITIVE Sensitive     CIPROFLOXACIN <=0.25 SENSITIVE Sensitive     GENTAMICIN 2 SENSITIVE Sensitive     IMIPENEM 1 SENSITIVE Sensitive     PIP/TAZO <=4 SENSITIVE Sensitive ug/mL    CEFEPIME  2 SENSITIVE Sensitive     * ABUNDANT PSEUDOMONAS AERUGINOSA   Serratia liquefaciens - MIC*    CEFEPIME  <=0.12 SENSITIVE Sensitive     CEFTAZIDIME <=1 SENSITIVE Sensitive     CEFTRIAXONE  <=0.25 SENSITIVE Sensitive     CIPROFLOXACIN <=0.25 SENSITIVE Sensitive     GENTAMICIN <=1 SENSITIVE Sensitive     IMIPENEM 1 SENSITIVE Sensitive     TRIMETH/SULFA <=20 SENSITIVE Sensitive     PIP/TAZO <=4 SENSITIVE Sensitive ug/mL    * ABUNDANT SERRATIA LIQUEFACIENS   Staphylococcus lugdunensis - MIC*    CIPROFLOXACIN <=0.5 SENSITIVE Sensitive     ERYTHROMYCIN <=0.25 SENSITIVE Sensitive     GENTAMICIN <=0.5 SENSITIVE Sensitive     OXACILLIN 2 SENSITIVE Sensitive     TETRACYCLINE <=1 SENSITIVE Sensitive     VANCOMYCIN <=0.5 SENSITIVE Sensitive     TRIMETH/SULFA <=10 SENSITIVE Sensitive     CLINDAMYCIN <=0.25 SENSITIVE Sensitive     RIFAMPIN <=0.5 SENSITIVE Sensitive     Inducible Clindamycin NEGATIVE Sensitive     * ABUNDANT STAPHYLOCOCCUS LUGDUNENSIS     Studies/Results: No results found.   Assessment/Plan: HILDUR BAYER is a 84 y.o. female with multiple med issues admitted with  cough, dysuria, elevated lactate, elevated wbc. UA +, CT renal neg for pyelo but with RLL PNA. 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   6/25 CT with RML and RLL mucus impaction. WBC 22  Recommendations Pneumonia- extensive esp R sided based on CT abd. Reports thick purulent sputum which has persisted. Sputum CX Serratia, Pseudomonas and staph lugdenesis. Persistently elevated wbc.  She also reports hx of recurrent cough and sputum off and on so suspect possible underlying bronchiectasis. 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 CT with no evidence pyelo. Should be adequately covered.  Thank you very much for the consult. Will follow with you.   Thank you very much for the consult. Will follow with you.  Alm SHAUNNA Needle   06/29/2023, 2:10 PM

## 2023-06-29 NOTE — Progress Notes (Signed)
 Occupational Therapy Treatment Patient Details Name: Eileen Wright MRN: [id number] DOB: 1940-04-11 Today's Date: 06/29/2023   History of present illness Pt is an 83 yo female that presented the ED for SOB, sputum.  workup for pneumonia and acute pyelonephritis, potential UTI. PMH of COPD, HTN, HLD, dCHF, hypothyroidism, CKD 3B, anemia, former smoker.   OT comments  Pt seen for OT treatment on this date. Upon arrival to room pt semi supine in bed with seated EOB, agreeable to tx. Pt performed bed mobility with supervision, extra time to complete without physical assistance. Pt disoriented to month and year which lead to the surfacing of overwhelming feelings about her current situation. Pt tolerated sitting on the EOB for ~76mins during session elaborating with author on feelings of worry and sadness regarding prolong hospital stay, the want to return home, foggy thinking, and having difficulty with word finding. Pt able to be consoled and returned to bed with all needs in reach, LPN in room aware of pt's feelings. Pt making progress toward goals, will continue to follow POC. Discharge recommendation remains appropriate.        If plan is discharge home, recommend the following:  A little help with walking and/or transfers;A little help with bathing/dressing/bathroom;Help with stairs or ramp for entrance   Equipment Recommendations  BSC/3in1    Recommendations for Other Services      Precautions / Restrictions Precautions Precautions: Fall Recall of Precautions/Restrictions: Intact Restrictions Weight Bearing Restrictions Per Provider Order: No       Mobility Bed Mobility Overal bed mobility: Needs Assistance Bed Mobility: Supine to Sit, Sit to Supine     Supine to sit: Supervision Sit to supine: Supervision   General bed mobility comments: No physical assistance required to come to EOB or return to supine.    Transfers                   General transfer comment:  Pt declined OOB mobility on this date.     Balance Overall balance assessment: Needs assistance Sitting-balance support: Feet unsupported, Single extremity supported Sitting balance-Leahy Scale: Good Sitting balance - Comments: Pt tolerated sitting on the EOB for ~20 minutes with feet supported while talking to author about feelings.                                   ADL either performed or assessed with clinical judgement   ADL Overall ADL's : Needs assistance/impaired     Grooming: Wash/dry face;Sitting;Set up                                 General ADL Comments: Pt declined further ADL participation due to feelings of worry and sadness about her current situation.    Communication Communication Communication: Impaired Factors Affecting Communication: Hearing impaired   Cognition Arousal: Alert Behavior During Therapy: Anxious, Lability Cognition: Cognition impaired   Orientation impairments: Situation, Time   Memory impairment (select all impairments): Declarative long-term memory   Executive functioning impairment (select all impairments): Reasoning OT - Cognition Comments: A/Ox2, disoriented to date and time                 Following commands: Intact        Cueing   Cueing Techniques: Verbal cues  Exercises Exercises: Other exercises Other Exercises Other Exercises: Edu: Benefits of OOB mobility to  aid in breathing/lung expansion, incentive spirometer reps throughout the day.    Shoulder Instructions       General Comments Brief phone call from daughter during session, pt didn't disclose feelings of worry/sadness to daughter, would like her not to know.    Pertinent Vitals/ Pain       Pain Assessment Pain Assessment: Faces Faces Pain Scale: Hurts little more Pain Location: R knee Pain Descriptors / Indicators: Discomfort, Constant Pain Intervention(s): Limited activity within patient's tolerance, Monitored during  session  Home Living                                          Prior Functioning/Environment              Frequency  Min 3X/week        Progress Toward Goals  OT Goals(current goals can now be found in the care plan section)  Progress towards OT goals: Progressing toward goals  Acute Rehab OT Goals OT Goal Formulation: With patient Time For Goal Achievement: 07/07/23 Potential to Achieve Goals: Good ADL Goals Pt Will Perform Grooming: with modified independence;standing Pt Will Perform Lower Body Dressing: with modified independence;sit to/from stand Pt Will Transfer to Toilet: with modified independence;ambulating;regular height toilet  Plan      Co-evaluation                 AM-PAC OT 6 Clicks Daily Activity     Outcome Measure   Help from another person eating meals?: None Help from another person taking care of personal grooming?: A Little Help from another person toileting, which includes using toliet, bedpan, or urinal?: A Little Help from another person bathing (including washing, rinsing, drying)?: A Little Help from another person to put on and taking off regular upper body clothing?: None Help from another person to put on and taking off regular lower body clothing?: A Lot 6 Click Score: 19    End of Session Equipment Utilized During Treatment: Gait belt;Rolling walker (2 wheels)  OT Visit Diagnosis: Other abnormalities of gait and mobility (R26.89);Muscle weakness (generalized) (M62.81)   Activity Tolerance Patient tolerated treatment well   Patient Left in bed;with call bell/phone within reach;with bed alarm set;with nursing/sitter in room   Nurse Communication Other (comment) (Liabile emotions)        Time: 8554-8487 OT Time Calculation (min): 27 min  Charges: OT General Charges $OT Visit: 1 Visit OT Treatments $Self Care/Home Management : 23-37 mins  Larraine Colas M.S. OTR/L  06/29/23, 3:46 PM

## 2023-06-29 NOTE — Progress Notes (Addendum)
 Speech Language Pathology Treatment: Dysphagia  Patient Details Name: Eileen Wright MRN: [id number] DOB: 1940/06/13 Today's Date: 06/29/2023 Time: 1210-1240 SLP Time Calculation (min) (ACUTE ONLY): 30 min  Assessment / Plan / Recommendation Clinical Impression  Pt seen for ongoing assessment of swallowing today. Pt awake, sitting EOB, verbal and A/O x3. She answered basic questions re: self and how she felt today. No change in her vocal quality- Baseline change per [facility] ENT note in 01/2022(see note).  Pt denied any swallowing issues at her meals; NSG denied any swallowing issues w/ Pills/meals. Pt is using the flutter valve and feels it helps. On RA, afebrile. WBC 21.7. Infectious Disease following and consulting Pulmonary d/t mucous plugging.    Pt continues to present w/ functional oropharyngeal phase swallowing w/ No overt oropharyngeal phase dysphagia noted, No neuromuscular deficits noted observed. Pt consumed po trials sitting EOB w/ No overt clinical s/s of aspiration during po trials.  Pt appears at reduced risk for aspiration following general aspiration precautions and taking rest breaks as needed for conservation of energy; also encouraged REFLUX precautions. However, pt does have challenging factors that could impact her oropharyngeal swallowing to include Pulmonary-Cardiac issues/decline w/ congested cough, deconditioning/weakness, REFLUX on PPI, and advanced age. She is also on strict fluid restrictions which impacts her meal choices. Pt endorses less appetite/intake.    During po trials, pt exhibited no overt coughing nor throat clearing; no wet vocal quality and no change in respiratory presentation during/post trials. Oral phase appeared [facility] w/ timely bolus management and  oral clearing. Encouraged pt to take rest breaks to avoid any WOB/SOB w/ the exertion of tasks; less talking during po intake.   Recommend continue a fairly Regular consistency diet w/ well-Cut meats, moistened  foods; Thin liquids -- monitor straw use, and pt should Hold Cup when drinking. Recommend general aspiration precautions including less talking at meals w/ rest breaks as needed for conservation of energy. Choose foods easy to eat also. REFLUX precautions. Tray setup and sitting up support for meals. Pills 1-2 at a time (baseline) vs WHOLE in Puree for ease as desired.  Reviewed Education given on Pills in Puree; food consistencies and easy to eat options; general aspiration and REFLUX precautions to pt. Practiced the flutter valve w/ pt-- good results!  ST services will monitor pt's status next 1-2 days for any further needs. At this time, no objective swallow assessment indicated as pt does not present w/ clinical need for such- but will continue to monitor. NSG updated. MD updated. Recommend Dietician f/u for support. Precautions posted in chart, room.      HPI HPI: Pt is an 83 yo female that presented the ED for SOB, sputum.  workup for pneumonia and acute pyelonephritis, potential UTI.  Pt is on Strict Fluid Restrictions in her diet Baseline and on Diuretics at home per chart. Followed by Nephrology.  PMH of Mitral Regurgitation, COPD, HTN, HLD, dCHF, hypothyroidism, CKD 3B, anemia, former smoker.  Chest CT this admit: Lungs/Pleura: Large area of consolidation involving the right lower  and right middle lobes as well as areas of consolidation at the left  lung base. There is occlusion of the right middle and right lower  lobe bronchi likely with mucous impaction or aspirated content. An  endobronchial lesion is not excluded. Several ground-glass nodules  in the left lower lobe as well as left upper lobe measure up to 9  mm.  Small Bilateral pleural effusions.  OF NOTE: Pt has no other chest  imaging since 2006; no head imaging since 2006.  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 Pt was followed for behavioral voice tx at [facility] briefly. Change of her voice did not fully resolve per pt.      SLP Plan  Continue with current plan of care          Recommendations  Diet recommendations: Regular;Thin liquid (cut meats, moistened foods) Liquids provided via: Cup Medication Administration: Whole meds with liquid (vs whole in puree if desired for ease) Supervision: Patient able to self feed (setup) Compensations: Minimize environmental distractions;Slow rate;Small sips/bites;Follow solids with liquid Postural Changes and/or Swallow Maneuvers: Out of bed for meals;Seated upright 90 degrees;Upright 30-60 min after meal (reflux precs.)                 (Dietician; Palliative Care f/u for support/GOC) Oral care BID;Patient independent with oral care   Set up Supervision/Assistance Dysphagia, unspecified (R13.10) (REFLUX at baseline- on PPI; Fluid Restrictions in diet d/t Cardiac)     Continue with current plan of care     Comer Portugal, MS, CCC-SLP Speech Language Pathologist Rehab Services; [facility] [phone] (ascom) Jearl Soto  06/29/2023, 3:05 PM

## 2023-06-30 ENCOUNTER — Inpatient Hospital Stay (HOSPITAL_COMMUNITY)
Admit: 2023-06-30 | Discharge: 2023-06-30 | Disposition: A | Discharge status: Home / Self Care | Attending: Internal Medicine | Admitting: Internal Medicine

## 2023-06-30 DIAGNOSIS — J189 Pneumonia, unspecified organism: Secondary | ICD-10-CM | POA: Diagnosis not present

## 2023-06-30 DIAGNOSIS — I5031 Acute diastolic (congestive) heart failure: Secondary | ICD-10-CM

## 2023-06-30 DIAGNOSIS — N179 Acute kidney failure, unspecified: Secondary | ICD-10-CM | POA: Diagnosis not present

## 2023-06-30 DIAGNOSIS — A419 Sepsis, unspecified organism: Secondary | ICD-10-CM | POA: Diagnosis not present

## 2023-06-30 DIAGNOSIS — I5032 Chronic diastolic (congestive) heart failure: Secondary | ICD-10-CM | POA: Diagnosis not present

## 2023-06-30 LAB — ECHOCARDIOGRAM COMPLETE
AR max vel: 2.18 cm2
AV Area VTI: 2.28 cm2
AV Area mean vel: 2.24 cm2
AV Mean grad: 9 mmHg
AV Peak grad: 16.9 mmHg
Ao pk vel: 2.06 m/s
Area-P 1/2: 3.19 cm2
Calc EF: 68.8 %
Height: 62 in
MV M vel: 5.34 m/s
MV Peak grad: 114.1 mmHg
MV VTI: 2.41 cm2
S' Lateral: 3.2 cm
Single Plane A2C EF: 63.5 %
Single Plane A4C EF: 73.2 %
Weight: 2292.78 [oz_av]

## 2023-06-30 MED ORDER — CIPROFLOXACIN HCL 500 MG PO TABS
750.0000 mg | ORAL_TABLET | Freq: Every day | ORAL | Status: DC
  Administered 2023-07-01 – 2023-07-04 (×4): 750 mg via ORAL
  Filled 2023-06-30 (×4): qty 1.5

## 2023-06-30 MED ORDER — IPRATROPIUM-ALBUTEROL 0.5-2.5 (3) MG/3ML IN SOLN
3.0000 mL | Freq: Two times a day (BID) | RESPIRATORY_TRACT | Status: DC
  Administered 2023-06-30 – 2023-07-04 (×8): 3 mL via RESPIRATORY_TRACT
  Filled 2023-06-30 (×8): qty 3

## 2023-06-30 MED ORDER — LACTULOSE 10 GM/15ML PO SOLN
20.0000 g | Freq: Once | ORAL | Status: AC
  Administered 2023-06-30: 20 g via ORAL
  Filled 2023-06-30: qty 30

## 2023-06-30 NOTE — Plan of Care (Signed)
   Problem: Education: Goal: Knowledge of General Education information will improve Description: Including pain rating scale, medication(s)/side effects and non-pharmacologic comfort measures Outcome: Progressing   Problem: Activity: Goal: Risk for activity intolerance will decrease Outcome: Progressing   Problem: Nutrition: Goal: Adequate nutrition will be maintained Outcome: Progressing

## 2023-06-30 NOTE — Progress Notes (Signed)
 Progress Note   Patient: Eileen Wright FMW:[id number] DOB: 1940/08/30 DOA: 06/21/2023     8 DOS: the patient was seen and examined on 06/30/2023   Brief hospital course: Eileen Wright is a 83 y.o. female with medical history significant of COPD, HTN, HLD, dCHF, hypothyroidism, CKD 3B, anemia, former smoker, who presents with cough with greenish sputum production and SOB for 3 days. Patient also reports dysuria and burning micturition with left flank pain.  On presentation patient had mild tachycardia and tachypnea, labs with leukocytosis at 20.9, BNP 500, lactic acid 1.1, CO2 of 12, potassium 5.2, AKI with creatinine at 3.37, BUN 105, recent baseline creatinine of 2.23 on 05/24/2023. Chest x-ray with bilateral lower lobe opacity and small bilateral pleural effusions.  Patient also has a profound elevation of procalcitonin level of 53.47.  Patient is seen by ID, started on cefepime .  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  Sputum culture grow Pseudomonas, Serratia and Staphylococcus Lugdunensis.  Chest CT scan was performed on 6/27, showed a mucous plug in the right upper and middle lobe bronchi.  With consolidation. Patient was started on DuoNeb, Mucomyst .   Principal Problem:   Sepsis due to pneumonia [facility]) Active Problems:   COPD (chronic obstructive pulmonary disease) (HCC)   Acute pyelonephritis   Chronic diastolic CHF (congestive heart failure) (HCC)   Hypothyroidism   Hypertension   Metabolic acidosis   Acute renal failure superimposed on stage 3b chronic kidney disease (HCC)   HLD (hyperlipidemia)   Anemia in chronic kidney disease (CKD)   Hyperkalemia   Overweight (BMI 25.0-29.9)   Reactive thrombocytosis   Assessment and Plan: Sepsis due to pneumonia (HCC) Right sided aspiration pneumonia. Patient does not have hypoxemia.  She has a profound elevation of procalcitonin level, which has dropped down to 0.3 after antibiotic treatment.   Still has persistent leukocytosis. Based on CT scan, patient has significant mucous plug, patient also coughed up a large amount of mucus.  I started him DuoNeb and Mucomyst . Appreciate ID consult.  Treated with cefepime  and Flagyl . Patient continued to cough up significant amount of mucus, repeat chest x-ray essentially has no change. Patient was also placed on oral steroids, which has not made her some agitated.  But short of breath much improved today. Per recommendation from ID, patient can be treated with Cipro for 21 days and Flagyl  for 10 days at discharge.  Patient also need to follow-up with pulmonology as outpatient to repeat a CT scan to make sure patient does not have airway obstruction from malignancy.    Pantoea agglomerans bacteremia. Followed by ID, covered with cefepime .  No additional workup is recommended.   Acute pyelonephritis Patient has a positive urinalysis, typical symptoms for urinary infection, with left flank pain, indicating acute pyelonephritis.  CT renal stone study was without any acute abnormality. -Urine cultures with multiple species On cefepime .   Acute renal failure superimposed on stage 3b chronic kidney disease (HCC) Metabolic acidosis. Hypokalemia. Hypomagnesemia. Likely secondary to concern of sepsis and continuation of diuretics. Renal function has improved, continue sodium bicarb for mild metabolic acidosis.  Received sodium sulfate.  Repeat BMP and magnesium  tomorrow.   COPD (chronic obstructive pulmonary disease) (HCC) No exacerbation. Had some wheezes after nebulization, added prednisone .   Chronic diastolic CHF (congestive heart failure) (HCC) 2D echo 02/01/2022 showed EF > 55% with grade 1 diastolic dysfunction, patient has mild fluid overload as discussed above. BNP elevated at 500, but her creatinine was 3.37  at that time.  Patient clinically does not have volume overload.  No shortness of breath or hypoxemia.       Hypothyroidism -  Continue home Synthroid    Hypertension Continue to monitor blood pressure.   HLD (hyperlipidemia) - Continue the home Lipitor   Anemia in chronic kidney disease (CKD) Reactive thrombocytosis. Hemoglobin 8.0 today, no iron deficiency, B12 level normal   Overweight (BMI 25.0-29.9) Body weight 66.7 kg, BMI 26.90 - Encourage losing weight - Exercise healthy diet      Subjective:  Patient feels better today, less shortness of breath.  Was agitated yesterday, better today.  Physical Exam: Vitals:   06/30/23 0413 06/30/23 0500 06/30/23 0724 06/30/23 0755  BP: 110/62   (!) 116/44  Pulse: 82   90  Resp: 20   17  Temp: 98.3 F (36.8 C)   98.1 F (36.7 C)  TempSrc:      SpO2: 96%  96% 95%  Weight:  65 kg    Height:       General exam: Appears calm and comfortable  Respiratory system: Decreased breath sounds. Respiratory effort normal. Cardiovascular system: S1 & S2 heard, RRR. No JVD, murmurs, rubs, gallops or clicks. No pedal edema. Gastrointestinal system: Abdomen is nondistended, soft and nontender. No organomegaly or masses felt. Normal bowel sounds heard. Central nervous system: Alert and oriented. No focal neurological deficits. Extremities: Symmetric 5 x 5 power. Skin: No rashes, lesions or ulcers Psychiatry: Judgement and insight appear normal. Mood & affect appropriate.    Data Reviewed:  No new labs today.  Reviewed chest x-rays.  Family Communication: Daughter updated over the phone  Disposition: Status is: Inpatient Remains inpatient appropriate because: Severity of disease Anticipating discharge to home with home health tomorrow.     Time spent: 35 minutes  Author: Murvin Mana, MD 06/30/2023 12:40 PM  For on call review [email or web address].

## 2023-06-30 NOTE — TOC Progression Note (Signed)
 Transition of Care [facility]) - Progression Note    Patient Details  Name: Eileen Wright MRN: [id number] Date of Birth: [birth year]  Transition of [facility]) CM/SW Contact  Quintella Suzen Jansky, RN Phone Number: 06/30/2023, 3:50 PM  Clinical Narrative:     Attempted to contact daughter, Emmie, to discuss home health services. No answer, left message.   Expected Discharge Plan: Home w Home Health Services Barriers to Discharge: Continued Medical Work up  Expected Discharge Plan and Services     Post Acute Care Choice: Home Health Living arrangements for the past 2 months: Single Family Home                   DME Agency: NA                   Social Determinants of Health (SDOH) Interventions SDOH Screenings   Food Insecurity: No Food Insecurity (06/22/2023)  Housing: Low Risk  (06/22/2023)  Transportation Needs: No Transportation Needs (06/22/2023)  Utilities: Not At Risk (06/22/2023)  Social Connections: Patient Declined (06/22/2023)  Tobacco Use: Medium Risk (06/21/2023)    Readmission Risk Interventions     No data to display

## 2023-06-30 NOTE — Progress Notes (Signed)
 Occupational Therapy Treatment Patient Details Name: Eileen Wright MRN: [id number] DOB: [birth year] Today's Date: 06/30/2023   History of present illness Pt is an 83 yo female that presented the ED for SOB, sputum.  workup for pneumonia and acute pyelonephritis, potential UTI. PMH of COPD, HTN, HLD, dCHF, hypothyroidism, CKD 3B, anemia, former smoker.   OT comments  Eileen Wright was seen for OT treatment on this date. Upon arrival to room pt in bed, agreeable to tx. Pt requires SBA + RW for toilet t/f, 1 minor posterior LOB, corrected with use of rail. MIN A don underwear, reports baseline. Pt making good progress toward goals, will continue to follow POC. Discharge recommendation remains appropriate.       If plan is discharge home, recommend the following:  A little help with walking and/or transfers;A little help with bathing/dressing/bathroom;Help with stairs or ramp for entrance   Equipment Recommendations  BSC/3in1    Recommendations for Other Services      Precautions / Restrictions Precautions Precautions: Fall Recall of Precautions/Restrictions: Intact Restrictions Weight Bearing Restrictions Per Provider Order: No       Mobility Bed Mobility Overal bed mobility: Needs Assistance Bed Mobility: Supine to Sit, Sit to Supine     Supine to sit: Supervision Sit to supine: Supervision        Transfers Overall transfer level: Needs assistance Equipment used: Rolling walker (2 wheels) Transfers: Sit to/from Stand Sit to Stand: Supervision                 Balance Overall balance assessment: Needs assistance Sitting-balance support: Feet supported Sitting balance-Leahy Scale: Good     Standing balance support: During functional activity, Single extremity supported, Reliant on assistive device for balance Standing balance-Leahy Scale: Good                             ADL either performed or assessed with clinical judgement   ADL Overall ADL's :  Needs assistance/impaired                                       General ADL Comments: SBA + RW for toilet t/f, 1 minor posterior LOB, corrected with use of rail     Communication Communication Communication: No apparent difficulties   Cognition Arousal: Alert Behavior During Therapy: WFL for tasks assessed/performed Cognition: No apparent impairments                               Following commands: Intact        Cueing      Exercises      Shoulder Instructions       General Comments      Pertinent Vitals/ Pain       Pain Assessment Pain Assessment: No/denies pain  Home Living                                          Prior Functioning/Environment              Frequency  Min 3X/week        Progress Toward Goals  OT Goals(current goals can now be found in the care plan section)  Progress towards OT  goals: Progressing toward goals  Acute Rehab OT Goals OT Goal Formulation: With patient Time For Goal Achievement: 07/07/23 Potential to Achieve Goals: Good ADL Goals Pt Will Perform Grooming: with modified independence;standing Pt Will Perform Lower Body Dressing: with modified independence;sit to/from stand Pt Will Transfer to Toilet: with modified independence;ambulating;regular height toilet  Plan      Co-evaluation                 AM-PAC OT 6 Clicks Daily Activity     Outcome Measure   Help from another person eating meals?: None Help from another person taking care of personal grooming?: A Little Help from another person toileting, which includes using toliet, bedpan, or urinal?: A Little Help from another person bathing (including washing, rinsing, drying)?: A Little Help from another person to put on and taking off regular upper body clothing?: None Help from another person to put on and taking off regular lower body clothing?: A Lot 6 Click Score: 19    End of Session Equipment  Utilized During Treatment: Rolling walker (2 wheels)  OT Visit Diagnosis: Other abnormalities of gait and mobility (R26.89);Muscle weakness (generalized) (M62.81)   Activity Tolerance Patient tolerated treatment well   Patient Left in bed;with call bell/phone within reach;with bed alarm set   Nurse Communication          Time: 8453-8397 OT Time Calculation (min): 16 min  Charges: OT General Charges $OT Visit: 1 Visit OT Treatments $Self Care/Home Management : 8-22 mins  Elston Slot, M.S. OTR/L  06/30/23, 4:21 PM  ascom [phone]

## 2023-06-30 NOTE — Progress Notes (Addendum)
 INFECTIOUS DISEASE PROGRESS NOTE Date of Admission:  06/21/2023     ID: Eileen Wright is a 83 y.o. female with  PNA Principal Problem:   Sepsis due to pneumonia [name]) Active Problems:   Hyperkalemia   Hypothyroidism   COPD (chronic obstructive pulmonary disease) (HCC)   Hypertension   Acute renal failure superimposed on stage 3b chronic kidney disease (HCC)   HLD (hyperlipidemia)   Chronic diastolic CHF (congestive heart failure) (HCC)   Anemia in chronic kidney disease (CKD)   Overweight (BMI 25.0-29.9)   Acute pyelonephritis   Metabolic acidosis   Reactive thrombocytosis   Subjective: No fevers, using flutter valve and some increased  thick sputum.   ROS  Eleven systems are reviewed and negative except per hpi  Medications:  Antibiotics Given (last 72 hours)     Date/Time Action Medication Dose Rate   06/27/23 1004 New Bag/Given   doxycycline  (VIBRAMYCIN ) 100 mg in sodium chloride  0.9 % 250 mL IVPB 100 mg 125 mL/hr   06/27/23 2125 Given   metroNIDAZOLE  (FLAGYL ) tablet 500 mg 500 mg    06/27/23 2134 New Bag/Given   ceFEPIme  (MAXIPIME ) 2 g in sodium chloride  0.9 % 100 mL IVPB 2 g 200 mL/hr   06/28/23 0905 Given   metroNIDAZOLE  (FLAGYL ) tablet 500 mg 500 mg    06/28/23 0911 New Bag/Given   ceFEPIme  (MAXIPIME ) 2 g in sodium chloride  0.9 % 100 mL IVPB 2 g 200 mL/hr   06/28/23 2110 Given   metroNIDAZOLE  (FLAGYL ) tablet 500 mg 500 mg    06/28/23 2113 New Bag/Given   ceFEPIme  (MAXIPIME ) 2 g in sodium chloride  0.9 % 100 mL IVPB 2 g 200 mL/hr   06/29/23 9090 Given   metroNIDAZOLE  (FLAGYL ) tablet 500 mg 500 mg    06/29/23 1233 New Bag/Given   ceFEPIme  (MAXIPIME ) 2 g in sodium chloride  0.9 % 100 mL IVPB 2 g 200 mL/hr   06/29/23 2153 Given   metroNIDAZOLE  (FLAGYL ) tablet 500 mg 500 mg    06/29/23 2200 New Bag/Given   ceFEPIme  (MAXIPIME ) 2 g in sodium chloride  0.9 % 100 mL IVPB 2 g 200 mL/hr       acetylcysteine   3 mL Nebulization BID   atorvastatin   10 mg Oral QHS    budesonide -glycopyrrolate -formoterol   2 puff Inhalation BID   cholecalciferol   5,000 Units Oral Daily   heparin   5,000 Units Subcutaneous Q8H   ipratropium-albuterol   3 mL Nebulization BID   lactulose   20 g Oral Once   levothyroxine   100 mcg Oral Q0600   loratadine   10 mg Oral Daily   melatonin  5 mg Oral QHS   metoprolol  succinate  12.5 mg Oral Daily   metroNIDAZOLE   500 mg Oral Q12H   pantoprazole   40 mg Oral Daily   predniSONE   40 mg Oral Q breakfast   senna-docusate  2 tablet Oral BID   sodium bicarbonate   1,300 mg Oral BID    Objective: Vital signs in last 24 hours: Temp:  [98.1 F (36.7 C)-98.4 F (36.9 C)] 98.1 F (36.7 C) (06/27 0755) Pulse Rate:  [82-100] 90 (06/27 0755) Resp:  [14-20] 17 (06/27 0755) BP: (110-126)/(44-62) 116/44 (06/27 0755) SpO2:  [95 %-96 %] 95 % (06/27 0755) Weight:  [65 kg] 65 kg (06/27 0500)  onstitutional:  oriented to person, place, and time. frail HENT: [place]/AT, PERRLA, no scleral icterus Mouth/Throat: Oropharynx is clear and moist. No oropharyngeal exudate.  Cardiovascular: Normal rate, regular rhythm and normal heart sounds.2/6 sm Pulmonary/Chest:  decrease bs bil bases and rhonchi Neck = supple, no nuchal rigidity Abdominal: Soft. Bowel sounds are normal.  exhibits no distension. There is no tenderness.  Lymphadenopathy: no cervical adenopathy. No axillary adenopathy Neurological: alert and oriented to person, place, and time.  Skin: Skin is warm and dry. No rash noted. No erythema.  Psychiatric: a normal mood and affect.  behavior is normal.  Lab Results Recent Labs    06/28/23 0509 06/29/23 0302  WBC 22.0* 21.7*  HGB 8.1* 8.0*  HCT 25.1* 25.2*  NA 139 138  K 3.9 3.8  CL 112* 110  CO2 19* 20*  BUN 36* 39*  CREATININE 1.23* 1.37*    Microbiology: Results for orders placed or performed during the hospital encounter of 06/21/23  Blood Culture (routine x 2)     Status: None   Collection Time: 06/21/23  7:55 PM   Specimen:  BLOOD  Result Value Ref Range Status   Specimen Description BLOOD BLOOD LEFT ARM  Final   Special Requests   Final    BOTTLES DRAWN AEROBIC AND ANAEROBIC Blood Culture adequate volume   Culture   Final    NO GROWTH 5 DAYS Performed at [facility], [address] [place], [place] [address]    Report Status 06/26/2023 FINAL  Final  Blood Culture (routine x 2)     Status: Abnormal   Collection Time: 06/21/23  7:55 PM   Specimen: BLOOD RIGHT ARM  Result Value Ref Range Status   Specimen Description   Final    BLOOD RIGHT ARM Performed at [facility], [address] [place], [place] [address]    Special Requests   Final    BOTTLES DRAWN AEROBIC AND ANAEROBIC Blood Culture adequate volume Performed at [facility], [address] [place], [place] [address]    Culture  Setup Time   Final    Organism ID to follow GRAM NEGATIVE RODS ANAEROBIC BOTTLE ONLY CRITICAL RESULT CALLED TO, READ BACK BY AND VERIFIED WITH: Raquel Rodriguez guzman @1816  on 06/22/23 skl Performed at [facility], [address] [place], [place] [address]    Culture PANTOEA AGGLOMERANS (A)  Final   Report Status 06/26/2023 FINAL  Final   Organism ID, Bacteria PANTOEA AGGLOMERANS  Final      Susceptibility   Pantoea agglomerans - MIC*    CEFEPIME  <=0.12 SENSITIVE Sensitive     CEFTAZIDIME <=1 SENSITIVE Sensitive     CEFTRIAXONE  <=0.25 SENSITIVE Sensitive     CIPROFLOXACIN <=0.25 SENSITIVE Sensitive     GENTAMICIN <=1 SENSITIVE Sensitive     IMIPENEM 0.5 SENSITIVE Sensitive     TRIMETH/SULFA <=20 SENSITIVE Sensitive     PIP/TAZO >=128 RESISTANT Resistant ug/mL    * PANTOEA AGGLOMERANS  Urine Culture (for pregnant, neutropenic or urologic patients or patients with an indwelling urinary catheter)     Status: Abnormal   Collection Time: 06/21/23  7:55 PM   Specimen: Urine, Clean Catch  Result Value Ref Range Status   Specimen Description   Final    URINE, CLEAN  CATCH Performed at [facility], [address], [place], [place] [address]    Special Requests   Final    NONE Performed at [facility], [address] [place], [place] [address]    Culture MULTIPLE SPECIES PRESENT, SUGGEST RECOLLECTION (A)  Final   Report Status 06/23/2023 FINAL  Final  Blood Culture ID Panel (Reflexed)     Status: Abnormal  Collection Time: 06/21/23  7:55 PM  Result Value Ref Range Status   Enterococcus faecalis NOT DETECTED NOT DETECTED Final   Enterococcus Faecium NOT DETECTED NOT DETECTED Final   Listeria monocytogenes NOT DETECTED NOT DETECTED Final   Staphylococcus species NOT DETECTED NOT DETECTED Final   Staphylococcus aureus (BCID) NOT DETECTED NOT DETECTED Final   Staphylococcus epidermidis NOT DETECTED NOT DETECTED Final   Staphylococcus lugdunensis NOT DETECTED NOT DETECTED Final   Streptococcus species NOT DETECTED NOT DETECTED Final   Streptococcus agalactiae NOT DETECTED NOT DETECTED Final   Streptococcus pneumoniae NOT DETECTED NOT DETECTED Final   Streptococcus pyogenes NOT DETECTED NOT DETECTED Final   A.calcoaceticus-baumannii NOT DETECTED NOT DETECTED Final   Bacteroides fragilis NOT DETECTED NOT DETECTED Final   Enterobacterales DETECTED (A) NOT DETECTED Final    Comment: Enterobacterales represent a large order of gram negative bacteria, not a single organism. Refer to culture for further identification. CRITICAL RESULT CALLED TO, READ BACK BY AND VERIFIED WITH: Raquel Evern guzman @1816  on 06/22/23 skl    Enterobacter cloacae complex NOT DETECTED NOT DETECTED Final   Escherichia coli NOT DETECTED NOT DETECTED Final   Klebsiella aerogenes NOT DETECTED NOT DETECTED Final   Klebsiella oxytoca NOT DETECTED NOT DETECTED Final   Klebsiella pneumoniae NOT DETECTED NOT DETECTED Final   Proteus species NOT DETECTED NOT DETECTED Final   Salmonella species NOT DETECTED NOT DETECTED Final   Serratia marcescens NOT DETECTED  NOT DETECTED Final   Haemophilus influenzae NOT DETECTED NOT DETECTED Final   Neisseria meningitidis NOT DETECTED NOT DETECTED Final   Pseudomonas aeruginosa NOT DETECTED NOT DETECTED Final   Stenotrophomonas maltophilia NOT DETECTED NOT DETECTED Final   Candida albicans NOT DETECTED NOT DETECTED Final   Candida auris NOT DETECTED NOT DETECTED Final   Candida glabrata NOT DETECTED NOT DETECTED Final   Candida krusei NOT DETECTED NOT DETECTED Final   Candida parapsilosis NOT DETECTED NOT DETECTED Final   Candida tropicalis NOT DETECTED NOT DETECTED Final   Cryptococcus neoformans/gattii NOT DETECTED NOT DETECTED Final   CTX-M ESBL NOT DETECTED NOT DETECTED Final   Carbapenem resistance IMP NOT DETECTED NOT DETECTED Final   Carbapenem resistance KPC NOT DETECTED NOT DETECTED Final   Carbapenem resistance NDM NOT DETECTED NOT DETECTED Final   Carbapenem resist OXA 48 LIKE NOT DETECTED NOT DETECTED Final   Carbapenem resistance VIM NOT DETECTED NOT DETECTED Final    Comment: Performed at [facility], [address] [place], [place] [address]  Expectorated Sputum Assessment w Gram Stain, Rflx to Resp Cult     Status: None   Collection Time: 06/22/23 12:20 AM   Specimen: Urine, Clean Catch; Sputum  Result Value Ref Range Status   Specimen Description SPU  Final   Special Requests NONE  Final   Sputum evaluation   Final    THIS SPECIMEN IS ACCEPTABLE FOR SPUTUM CULTURE Performed at [facility], [address] [place], [place] [address]    Report Status 06/22/2023 FINAL  Final  Resp panel by RT-PCR (RSV, Flu A&B, Covid) Urine, Clean Catch     Status: None   Collection Time: 06/22/23 12:20 AM   Specimen: Urine, Clean Catch; Nasal Swab  Result Value Ref Range Status   SARS Coronavirus 2 by RT PCR NEGATIVE NEGATIVE Final    Comment: (NOTE) SARS-CoV-2 target nucleic acids are NOT DETECTED.  The SARS-CoV-2 RNA is generally detectable in upper respiratory specimens  during the acute phase of infection. The lowest concentration  of SARS-CoV-2 viral copies this assay can detect is 138 copies/mL. A negative result does not preclude SARS-Cov-2 infection and should not be used as the sole basis for treatment or other patient management decisions. A negative result may occur with  improper specimen collection/handling, submission of specimen other than nasopharyngeal swab, presence of viral mutation(s) within the areas targeted by this assay, and inadequate number of viral copies(<138 copies/mL). A negative result must be combined with clinical observations, patient history, and epidemiological information. The expected result is Negative.  Fact Sheet for Patients:  BloggerCourse.com  Fact Sheet for Healthcare Providers:  SeriousBroker.it  This test is no t yet approved or cleared by the [place]  FDA and  has been authorized for detection and/or diagnosis of SARS-CoV-2 by FDA under an Emergency Use Authorization (EUA). This EUA will remain  in effect (meaning this test can be used) for the duration of the COVID-19 declaration under Section 564(b)(1) of the Act, 21 U.S.C.section 360bbb-3(b)(1), unless the authorization is terminated  or revoked sooner.       Influenza A by PCR NEGATIVE NEGATIVE Final   Influenza B by PCR NEGATIVE NEGATIVE Final    Comment: (NOTE) The Xpert Xpress SARS-CoV-2/FLU/RSV plus assay is intended as an aid in the diagnosis of influenza from Nasopharyngeal swab specimens and should not be used as a sole basis for treatment. Nasal washings and aspirates are unacceptable for Xpert Xpress SARS-CoV-2/FLU/RSV testing.  Fact Sheet for Patients: BloggerCourse.com  Fact Sheet for Healthcare Providers: SeriousBroker.it  This test is not yet approved or cleared by the [place]  FDA and has been authorized for detection  and/or diagnosis of SARS-CoV-2 by FDA under an Emergency Use Authorization (EUA). This EUA will remain in effect (meaning this test can be used) for the duration of the COVID-19 declaration under Section 564(b)(1) of the Act, 21 U.S.C. section 360bbb-3(b)(1), unless the authorization is terminated or revoked.     Resp Syncytial Virus by PCR NEGATIVE NEGATIVE Final    Comment: (NOTE) Fact Sheet for Patients: BloggerCourse.com  Fact Sheet for Healthcare Providers: SeriousBroker.it  This test is not yet approved or cleared by the [place]  FDA and has been authorized for detection and/or diagnosis of SARS-CoV-2 by FDA under an Emergency Use Authorization (EUA). This EUA will remain in effect (meaning this test can be used) for the duration of the COVID-19 declaration under Section 564(b)(1) of the Act, 21 U.S.C. section 360bbb-3(b)(1), unless the authorization is terminated or revoked.  Performed at [facility], [address] [place], [place] [address]   Culture, Respiratory w Gram Stain     Status: None   Collection Time: 06/22/23 12:20 AM   Specimen: Sputum  Result Value Ref Range Status   Specimen Description   Final    SPU Performed at [facility], [address] [place], [place] [address]    Special Requests   Final    NONE Reflexed from [phone] Performed at [facility], [address] [place], [place] [address]    Gram Stain   Final    ABUNDANT WBC PRESENT, PREDOMINANTLY PMN ABUNDANT SQUAMOUS EPITHELIAL CELLS PRESENT ABUNDANT GRAM POSITIVE COCCI ABUNDANT GRAM POSITIVE RODS MODERATE GRAM NEGATIVE RODS Performed at [facility], [address] [place], [place] [address]    Culture   Final    ABUNDANT PSEUDOMONAS AERUGINOSA ABUNDANT SERRATIA LIQUEFACIENS Two isolates with different morphologies were identified as the same organism.The most resistant organism was reported.  FOR PSEUDOMONAS AERUGINOSA ABUNDANT  STAPHYLOCOCCUS LUGDUNENSIS    Report Status 06/26/2023 FINAL  Final   Organism ID, Bacteria PSEUDOMONAS AERUGINOSA  Final   Organism ID, Bacteria SERRATIA LIQUEFACIENS  Final   Organism ID, Bacteria STAPHYLOCOCCUS LUGDUNENSIS  Final      Susceptibility   Pseudomonas aeruginosa - MIC*    CEFTAZIDIME <=1 SENSITIVE Sensitive     CIPROFLOXACIN <=0.25 SENSITIVE Sensitive     GENTAMICIN 2 SENSITIVE Sensitive     IMIPENEM 1 SENSITIVE Sensitive     PIP/TAZO <=4 SENSITIVE Sensitive ug/mL    CEFEPIME  2 SENSITIVE Sensitive     * ABUNDANT PSEUDOMONAS AERUGINOSA   Serratia liquefaciens - MIC*    CEFEPIME  <=0.12 SENSITIVE Sensitive     CEFTAZIDIME <=1 SENSITIVE Sensitive     CEFTRIAXONE  <=0.25 SENSITIVE Sensitive     CIPROFLOXACIN <=0.25 SENSITIVE Sensitive     GENTAMICIN <=1 SENSITIVE Sensitive     IMIPENEM 1 SENSITIVE Sensitive     TRIMETH/SULFA <=20 SENSITIVE Sensitive     PIP/TAZO <=4 SENSITIVE Sensitive ug/mL    * ABUNDANT SERRATIA LIQUEFACIENS   Staphylococcus lugdunensis - MIC*    CIPROFLOXACIN <=0.5 SENSITIVE Sensitive     ERYTHROMYCIN <=0.25 SENSITIVE Sensitive     GENTAMICIN <=0.5 SENSITIVE Sensitive     OXACILLIN 2 SENSITIVE Sensitive     TETRACYCLINE <=1 SENSITIVE Sensitive     VANCOMYCIN <=0.5 SENSITIVE Sensitive     TRIMETH/SULFA <=10 SENSITIVE Sensitive     CLINDAMYCIN <=0.25 SENSITIVE Sensitive     RIFAMPIN <=0.5 SENSITIVE Sensitive     Inducible Clindamycin NEGATIVE Sensitive     * ABUNDANT STAPHYLOCOCCUS LUGDUNENSIS     Studies/Results: DG Chest 2 View Result Date: 06/29/2023 CLINICAL DATA:  Aspiration pneumonia EXAM: CHEST - 2 VIEW COMPARISON:  Chest x-ray 06/21/2023.  CT scan chest 06/27/2023 FINDINGS: Frontal view is rotated to the right. Hyperinflation. Persistent small pleural effusions and lung base opacities. No pneumothorax or edema. Only slight prominence of central vasculature. Borderline size heart. Calcified aorta. Diffuse  degenerative changes along the spine. Osteopenia. IMPRESSION: No significant interval change when adjusted for technique. Electronically Signed   By: Ranell Bring M.D.   On: 06/29/2023 16:22     Assessment/Plan: RYLYN ZAWISTOWSKI is a 83 y.o. female with multiple med issues admitted with cough, dysuria, elevated lactate, elevated wbc. UA +, CT renal neg for pyelo but with RLL PNA. 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   6/25 CT with RML and RLL mucus impaction. WBC 22  Recommendations Pneumonia- extensive esp R sided based on CT abd. Reports thick purulent sputum which has persisted. Sputum CX Serratia, Pseudomonas and staph lugdenesis. Persistently elevated wbc.  She also reports hx of recurrent cough and sputum off and on so suspect possible underlying bronchiectasis. 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 CT with no  evidence pyelo. Should be adequately covered.  Thank you very much for the consult. Will sign off now but please call with questions   Alm SHAUNNA Needle   06/30/2023, 9:37 AM

## 2023-06-30 NOTE — Progress Notes (Signed)
 Physical Therapy Treatment Patient Details Name: Eileen Wright MRN: [id number] DOB: 1940-12-18 Today's Date: 06/30/2023   History of Present Illness Pt is an 83 yo female that presented the ED for SOB, sputum.  workup for pneumonia and acute pyelonephritis, potential UTI. PMH of COPD, HTN, HLD, dCHF, hypothyroidism, CKD 3B, anemia, former smoker.    PT Comments  Pt was long sitting in bed upon arrival.  She received breathing treatment prior to session however remains SOB with minimal activity. She does continue to easily and safely demonstrate abilities to exit bed, stand to RW, and ambulate to doorway of room and back. Distance limited by SOB/fatigue/ and breakfast tray arriving. Pt does not feel ready to DC home but does still endorse not wanting rehab. Did confirm with family/daughter two days prior that she will have some assistance at DC. Acute PT will continue to follow and progress per current POC.    If plan is discharge home, recommend the following: A little help with walking and/or transfers;A little help with bathing/dressing/bathroom;Assistance with cooking/housework;Assist for transportation;Help with stairs or ramp for entrance     Equipment Recommendations  None recommended by PT       Precautions / Restrictions Precautions Precautions: Fall Recall of Precautions/Restrictions: Intact Restrictions Weight Bearing Restrictions Per Provider Order: No     Mobility  Bed Mobility Overal bed mobility: Needs Assistance Bed Mobility: Supine to Sit, Sit to Supine  Supine to sit: Supervision Sit to supine: Supervision   Transfers Overall transfer level: Needs assistance Equipment used: Rolling walker (2 wheels) Transfers: Sit to/from Stand Sit to Stand: Supervision  General transfer comment: no physical assistance to stand from EOB    Ambulation/Gait Ambulation/Gait assistance: Supervision Gait Distance (Feet): 30 Feet Assistive device: Rolling walker (2 wheels) Gait  Pattern/deviations: Step-through pattern Gait velocity: decreased  General Gait Details: pt easily able to ambulate to doorway of room and back to EOB. SOB noted but pt sao2 remains >90%    Balance Overall balance assessment: Needs assistance Sitting-balance support: Feet supported Sitting balance-Leahy Scale: Good     Standing balance support: During functional activity, Single extremity supported, Reliant on assistive device for balance Standing balance-Leahy Scale: Good Standing balance comment: reliant on RW but is at baseline     Communication Communication Communication: No apparent difficulties  Cognition Arousal: Alert Behavior During Therapy: WFL for tasks assessed/performed   PT - Cognitive impairments: No apparent impairments    PT - Cognition Comments: Pt is A ad O x 4. Does endorse fatigue and difficulty catching her breath after limted activity Following commands: Intact      Cueing Cueing Techniques: Verbal cues         Pertinent Vitals/Pain Pain Assessment Pain Assessment: No/denies pain Pain Score: 0-No pain Pain Descriptors / Indicators: Discomfort, Constant Pain Intervention(s): Limited activity within patient's tolerance, Monitored during session, Premedicated before session, Repositioned     PT Goals (current goals can now be found in the care plan section) Acute Rehab PT Goals Patient Stated Goal: go home when able and breathing better Progress towards PT goals: Progressing toward goals    Frequency    Min 2X/week       AM-PAC PT 6 Clicks Mobility   Outcome Measure  Help needed turning from your back to your side while in a flat bed without using bedrails?: A Little Help needed moving from lying on your back to sitting on the side of a flat bed without using bedrails?: A Little Help needed  moving to and from a bed to a chair (including a wheelchair)?: A Little Help needed standing up from a chair using your arms (e.g., wheelchair or  bedside chair)?: A Little Help needed to walk in hospital room?: A Little Help needed climbing 3-5 steps with a railing? : A Little 6 Click Score: 18    End of Session   Activity Tolerance: Patient tolerated treatment well;Patient limited by fatigue;Other (comment) (limited by breakfast tray arriving) Patient left: Other (comment) (pt was seated EOB eating breakfast at conclusion of session. Bed alarm set and call bell in reach) Nurse Communication: Mobility status PT Visit Diagnosis: Other abnormalities of gait and mobility (R26.89);Difficulty in walking, not elsewhere classified (R26.2);Muscle weakness (generalized) (M62.81)     Time: 0810-0822 PT Time Calculation (min) (ACUTE ONLY): 12 min  Charges:    $Therapeutic Activity: 8-22 mins PT General Charges $$ ACUTE PT VISIT: 1 Visit                     Rankin Essex PTA 06/30/23, 8:34 AM

## 2023-06-30 NOTE — Plan of Care (Signed)

## 2023-06-30 NOTE — Progress Notes (Signed)
 Speech Language Pathology Treatment: Dysphagia  Patient Details Name: Eileen Wright MRN: [id number] DOB: Sep 16, 1940 Today's Date: 06/30/2023 Time: 8554-8479 SLP Time Calculation (min) (ACUTE ONLY): 35 min  Assessment / Plan / Recommendation Clinical Impression  Eileen Wright seen for ongoing assessment of swallowing today. Eileen Wright awake, sitting EOB, verbal and A/O. She answered general questions re: self and how she felt today. She endorsed overt s/s of REFLUX activity(phlegm expectoration; globus)- MD made aware. No change in her vocal quality- Baseline change per [facility] ENT note in 01/2022(see note).  Eileen Wright denied any swallowing issues at her meals; NSG denied any swallowing issues w/ Pills/meals. Eileen Wright is using the flutter valve and incentive spirometer intermittently. On RA, afebrile. Infectious Disease following and consulting Pulmonary d/t mucous plugging.    Eileen Wright continues to present w/ functional oropharyngeal phase swallowing w/ No overt oropharyngeal phase dysphagia noted, No neuromuscular deficits noted observed. Eileen Wright consumed po trials sitting EOB w/ No overt clinical s/s of aspiration during po trials.  Eileen Wright appears at reduced risk for aspiration following general aspiration precautions and taking rest breaks as needed for conservation of energy; also encouraged REFLUX precautions. However, Eileen Wright does have challenging factors that could impact her oropharyngeal swallowing to include Pulmonary-Cardiac issues/decline w/ congested cough, deconditioning/weakness, REFLUX on PPI, and advanced age. She is also on strict fluid restrictions which impacts her meal choices. Eileen Wright endorses less appetite/intake.    During po trials, Eileen Wright exhibited no overt coughing nor throat clearing; no wet vocal quality and no change in respiratory presentation during/post trials. Oral phase appeared [facility] w/ timely bolus management and  oral clearing. Encouraged Eileen Wright to take rest breaks to avoid any WOB/SOB w/ the exertion of tasks and to allow for  Esophageal clearing; less globus sensation risk.  Much discussion and Handouts given on REFLUX activity, its exacerbation from inc'd Stress ongoing, and its impact on oral intake in general; risk for REFLUX aspiration of material during Retrograde flow. Eileen Wright is on a PPI 1x daily; this was d/w MD to consider BID.    Recommend continue a fairly Regular consistency diet w/ well-Cut meats, moistened foods; Thin liquids -- monitor straw use, and Eileen Wright to Hold Cup when drinking. Recommend general aspiration precautions including less talking at meals w/ rest breaks as needed for conservation of energy. Choose foods easy to eat also. REFLUX precautions. Tray setup and sitting up support for meals. Pills 1-2 at a time (baseline) vs WHOLE in Puree for ease as desired.  Reviewed Education given on Pills in Puree; food consistencies and easy to eat options; general aspiration and REFLUX precautions to Eileen Wright. Handouts. Practiced the flutter valve w/ Eileen Wright-- good results!  ST services will sign off at this time as Eileen Wright appears to present w/ functional oropharyngeal swallowing; she followed education and general precautions as given. NSG updated. MD updated.  Recommend Dietician and GI f/u for support. Precautions posted in chart, room.        HPI HPI: Eileen Wright is an 83 yo female that presented the ED for SOB, sputum.  workup for pneumonia and acute pyelonephritis, potential UTI.  Eileen Wright is on Strict Fluid Restrictions in her diet Baseline and on Diuretics at home per chart. Followed by Nephrology.  PMH of Mitral Regurgitation, COPD, HTN, HLD, dCHF, hypothyroidism, CKD 3B, anemia, former smoker.  Chest CT this admit: Lungs/Pleura: Large area of consolidation involving the right lower  and right middle lobes as well as areas of consolidation at the left  lung base. There is occlusion of the  right middle and right lower  lobe bronchi likely with mucous impaction or aspirated content. An  endobronchial lesion is not excluded. Several  ground-glass nodules  in the left lower lobe as well as left upper lobe measure up to 9  mm.  Small Bilateral pleural effusions.  OF NOTE: Eileen Wright has no other chest imaging since 2006; no head imaging since 2006.  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 Eileen Wright was followed for behavioral voice tx at [facility] briefly. Change of her voice did not fully resolve per Eileen Wright.      SLP Plan  All goals met          Recommendations  Diet recommendations: Regular;Thin liquid (cut/chopped foods for ease) Liquids provided via: Cup (less straw) Medication Administration: Whole meds with liquid ((water ) vs whole in puree if needed) Supervision: Patient able to self feed (setup) Compensations: Minimize environmental distractions;Slow rate;Small sips/bites;Follow solids with liquid Postural Changes and/or Swallow Maneuvers: Out of bed for meals;Seated upright 90 degrees;Upright 30-60 min after meal (REFLUX precs.)                 (Dietician f/u; GI f/u if needed) Oral care BID;Patient independent with oral care   Set up Supervision/Assistance Dysphagia, unspecified (R13.10) (REFLUX at baseline- on PPI; Fluid Restrictions in diet d/t Cardiac issues)     All goals met       Comer Portugal, MS, CCC-SLP Speech Language Pathologist Rehab Services; [facility] [phone] (ascom) Bethany Hirt  06/30/2023, 4:42 PM

## 2023-07-01 DIAGNOSIS — I5032 Chronic diastolic (congestive) heart failure: Secondary | ICD-10-CM | POA: Diagnosis not present

## 2023-07-01 DIAGNOSIS — J189 Pneumonia, unspecified organism: Secondary | ICD-10-CM | POA: Diagnosis not present

## 2023-07-01 DIAGNOSIS — J449 Chronic obstructive pulmonary disease, unspecified: Secondary | ICD-10-CM | POA: Diagnosis not present

## 2023-07-01 DIAGNOSIS — A419 Sepsis, unspecified organism: Secondary | ICD-10-CM | POA: Diagnosis not present

## 2023-07-01 LAB — CBC
HCT: 26.1 % — ABNORMAL LOW (ref 36.0–46.0)
Hemoglobin: 8.4 g/dL — ABNORMAL LOW (ref 12.0–15.0)
MCH: 30.1 pg (ref 26.0–34.0)
MCHC: 32.2 g/dL (ref 30.0–36.0)
MCV: 93.5 fL (ref 80.0–100.0)
Platelets: 423 10*3/uL — ABNORMAL HIGH (ref 150–400)
RBC: 2.79 MIL/uL — ABNORMAL LOW (ref 3.87–5.11)
RDW: 15.4 % (ref 11.5–15.5)
WBC: 20.8 10*3/uL — ABNORMAL HIGH (ref 4.0–10.5)
nRBC: 0 % (ref 0.0–0.2)

## 2023-07-01 LAB — BASIC METABOLIC PANEL WITH GFR
Anion gap: 8 (ref 5–15)
BUN: 40 mg/dL — ABNORMAL HIGH (ref 8–23)
CO2: 19 mmol/L — ABNORMAL LOW (ref 22–32)
Calcium: 10.4 mg/dL — ABNORMAL HIGH (ref 8.9–10.3)
Chloride: 115 mmol/L — ABNORMAL HIGH (ref 98–111)
Creatinine, Ser: 1.48 mg/dL — ABNORMAL HIGH (ref 0.44–1.00)
GFR, Estimated: 35 mL/min — ABNORMAL LOW (ref >60)
Glucose, Bld: 80 mg/dL (ref 70–99)
Potassium: 3.5 mmol/L (ref 3.5–5.1)
Sodium: 142 mmol/L (ref 135–145)

## 2023-07-01 LAB — MAGNESIUM: Magnesium: 2.4 mg/dL (ref 1.7–2.4)

## 2023-07-01 LAB — PHOSPHORUS: Phosphorus: 3 mg/dL (ref 2.5–4.6)

## 2023-07-01 MED ORDER — POTASSIUM CHLORIDE 2 MEQ/ML IV SOLN
INTRAVENOUS | Status: AC
  Filled 2023-07-01: qty 1000

## 2023-07-01 MED ORDER — POTASSIUM CHLORIDE CRYS ER 20 MEQ PO TBCR: 40.0000 meq | EXTENDED_RELEASE_TABLET | Freq: Once | ORAL | Status: DC

## 2023-07-01 MED ORDER — KCL-LACTATED RINGERS 20 MEQ/L IV SOLN: INTRAVENOUS | Status: DC

## 2023-07-01 NOTE — TOC Progression Note (Signed)
 Transition of Care [facility]) - Progression Note    Patient Details  Name: Eileen Wright MRN: [id number] Date of Birth: June 21, 1940  Transition [facility] CM/SW Contact  Marinda Cooks, RN Phone Number: 07/01/2023, 5:05 PM  Clinical Narrative:    This CM spoke with pt's daughter Emmie @[phone] and confirmed [facility] agency [place] as 1st choice. This CM spoke with liaison [facility] and secured SOC within 24-28 hrs of pt's dc. TOC will cont to follow dc planning / care coordination and update as applicable.      Expected Discharge Plan: Home w Home Health Services Barriers to Discharge: Continued Medical Work up  Expected Discharge Plan and Services     Post Acute Care Choice: Home Health Living arrangements for the past 2 months: Single Family Home                   DME Agency: NA                   Social Determinants of Health (SDOH) Interventions SDOH Screenings   Food Insecurity: No Food Insecurity (06/22/2023)  Housing: Low Risk  (06/22/2023)  Transportation Needs: No Transportation Needs (06/22/2023)  Utilities: Not At Risk (06/22/2023)  Social Connections: Patient Declined (06/22/2023)  Tobacco Use: Medium Risk (06/21/2023)    Readmission Risk Interventions     No data to display

## 2023-07-01 NOTE — Plan of Care (Signed)

## 2023-07-01 NOTE — Progress Notes (Signed)
 Physical Therapy Treatment Patient Details Name: Eileen Wright MRN: [id number] DOB: 30-Jun-1940 Today's Date: 07/01/2023   History of Present Illness Pt is an 83 yo female that presented the ED for SOB, sputum.  workup for pneumonia and acute pyelonephritis, potential UTI. PMH of COPD, HTN, HLD, dCHF, hypothyroidism, CKD 3B, anemia, former smoker.    PT Comments  Planned session with pt + with family present. Supportive daughter/son/and daughter in-law present. Pt remains A and O x 4. Agreeable and cooperative. On rm air throughout session with sao2 >91 % however pt continues to have severe SOB with minimal activity. Pt does not required physical assistance to exit bed, stand, or to ambulate short distances. Discussed at length DC recommendation. Both pt and family feel pt can safely manage at home at DC in current state.  Discussed use of rollator versus RW, need to continue to perform short frequent ambulation, also need for increased food intake, and importance of closely monitoring sao2. Pt and family state understanding. She will continue to benefit form skilled PT at DC to maximize independence and safety with all ADLs.    If plan is discharge home, recommend the following: A little help with walking and/or transfers;A little help with bathing/dressing/bathroom;Assistance with cooking/housework;Assist for transportation;Help with stairs or ramp for entrance     Equipment Recommendations  None recommended by PT       Precautions / Restrictions Precautions Precautions: Fall Recall of Precautions/Restrictions: Intact Restrictions Weight Bearing Restrictions Per Provider Order: No     Mobility  Bed Mobility Overal bed mobility: Modified Independent Bed Mobility: Supine to Sit  Supine to sit: Supervision, HOB elevated, Used rails  General bed mobility comments: no physical assistance required to exit bed    Transfers Overall transfer level: Needs assistance Equipment used: Rolling  walker (2 wheels) Transfers: Sit to/from Stand Sit to Stand: Supervision  General transfer comment: Pt stood from EOB surface 2 x , BSC over toilet 1 x, and then 1 x from standard chair in room.    Ambulation/Gait Ambulation/Gait assistance: Contact guard assist, Supervision Gait Distance (Feet): 40 Feet Assistive device: Rolling walker (2 wheels) Gait Pattern/deviations: Step-through pattern Gait velocity: decreased  General Gait Details: Pt was able to ambulate 4 short gait distances. SOB noted with very minimal activity. sao2 > 92% on rm air. Pt ambulated to BR then to chair in room prior to ambulation to recliner. 40 ft, 23ft,12ft,2 ft   Balance Overall balance assessment: Needs assistance Sitting-balance support: Feet supported Sitting balance-Leahy Scale: Good     Standing balance support: During functional activity, Reliant on assistive device for balance, Bilateral upper extremity supported Standing balance-Leahy Scale: Good       Communication Communication Communication: No apparent difficulties  Cognition Arousal: Alert Behavior During Therapy: WFL for tasks assessed/performed   PT - Cognitive impairments: No apparent impairments      PT - Cognition Comments: Pt is A ad O x 4. Does endorse fatigue and difficulty catching her breath after limited activity Following commands: Intact      Cueing Cueing Techniques: Verbal cues, Tactile cues     General Comments General comments (skin integrity, edema, etc.): most of session spent discussing home versus STR with pt/pt's daughter/son/and daughter in law. Pt is severely deconditioned but strength remains good. She has available help at DC. DC recs remain appropriate. pt/family educated on importance of intake, frequenct short rioutine walks, recommended possible rollator use for having seat close. encouraged close monitoing of sao2 at DC  as well.      Pertinent Vitals/Pain Pain Assessment Pain Assessment: No/denies  pain     PT Goals (current goals can now be found in the care plan section) Acute Rehab PT Goals Patient Stated Goal: go home when able and breathing better Progress towards PT goals: Progressing toward goals    Frequency    Min 2X/week       AM-PAC PT 6 Clicks Mobility   Outcome Measure  Help needed turning from your back to your side while in a flat bed without using bedrails?: A Little Help needed moving from lying on your back to sitting on the side of a flat bed without using bedrails?: A Little Help needed moving to and from a bed to a chair (including a wheelchair)?: A Little Help needed standing up from a chair using your arms (e.g., wheelchair or bedside chair)?: A Little Help needed to walk in hospital room?: A Little Help needed climbing 3-5 steps with a railing? : A Little 6 Click Score: 18    End of Session   Activity Tolerance: Patient tolerated treatment well;Patient limited by fatigue;Other (comment) (limited by SOB) Patient left: in chair;with call bell/phone within reach;with chair alarm set;with family/visitor present;with nursing/sitter in room Nurse Communication: Mobility status PT Visit Diagnosis: Other abnormalities of gait and mobility (R26.89);Difficulty in walking, not elsewhere classified (R26.2);Muscle weakness (generalized) (M62.81)     Time: 8696-8642 PT Time Calculation (min) (ACUTE ONLY): 54 min  Charges:    $Gait Training: 23-37 mins $Therapeutic Activity: 23-37 mins PT General Charges $$ ACUTE PT VISIT: 1 Visit                     Rankin Essex PTA 07/01/23, 2:32 PM

## 2023-07-01 NOTE — Progress Notes (Signed)
 Progress Note   Patient: Eileen Wright FMW:[id number] DOB: 12/08/1940 DOA: 06/21/2023     9 DOS: the patient was seen and examined on 07/01/2023   Brief hospital course: Eileen Wright is a 83 y.o. female with medical history significant of COPD, HTN, HLD, dCHF, hypothyroidism, CKD 3B, anemia, former smoker, who presents with cough with greenish sputum production and SOB for 3 days. Patient also reports dysuria and burning micturition with left flank pain.  On presentation patient had mild tachycardia and tachypnea, labs with leukocytosis at 20.9, BNP 500, lactic acid 1.1, CO2 of 12, potassium 5.2, AKI with creatinine at 3.37, BUN 105, recent baseline creatinine of 2.23 on 05/24/2023. Chest x-ray with bilateral lower lobe opacity and small bilateral pleural effusions.  Patient also has a profound elevation of procalcitonin level of 53.47.  Patient is seen by ID, started on cefepime .  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  Sputum culture grow Pseudomonas, Serratia and Staphylococcus Lugdunensis.  Chest CT scan was performed on 6/27, showed a mucous plug in the right upper and middle lobe bronchi.  With consolidation. Patient was started on DuoNeb, Mucomyst .   Principal Problem:   Sepsis due to pneumonia [facility]) Active Problems:   COPD (chronic obstructive pulmonary disease) (HCC)   Acute pyelonephritis   Chronic diastolic CHF (congestive heart failure) (HCC)   Hypothyroidism   Hypertension   Metabolic acidosis   Acute renal failure superimposed on stage 3b chronic kidney disease (HCC)   HLD (hyperlipidemia)   Anemia in chronic kidney disease (CKD)   Hyperkalemia   Overweight (BMI 25.0-29.9)   Reactive thrombocytosis   Assessment and Plan: Sepsis due to pneumonia (HCC) Right sided aspiration pneumonia. Patient does not have hypoxemia.  She has a profound elevation of procalcitonin level, which has dropped down to 0.3 after antibiotic treatment.   Still has persistent leukocytosis. Based on CT scan, patient has significant mucous plug, patient also coughed up a large amount of mucus.  I started him DuoNeb and Mucomyst . Appreciate ID consult.  Treated with cefepime  and Flagyl . Per recommendation from ID, patient can be treated with Cipro for 21 days and Flagyl  for 10 days at discharge. Patient is also seen by pulmonology, will consider bronchoscopy if condition does not improve. Patient also has dysphagia, has seen by speech therapy, did not adjust her diet.  But her condition is more consistent with aspiration pneumonia. Condition appears to be improving today, we will continue bronchodilator, steroids.   Pantoea agglomerans bacteremia. Followed by ID, covered with cefepime .  No additional workup is recommended.   Acute pyelonephritis Patient has a positive urinalysis, typical symptoms for urinary infection, with left flank pain, indicating acute pyelonephritis.  CT renal stone study was without any acute abnormality. -Urine cultures with multiple species On cefepime .   Acute renal failure superimposed on stage 3b chronic kidney disease (HCC) Metabolic acidosis. Hypokalemia. Hypomagnesemia. Renal function initially improved, patient has poor p.o. intake, slowly worsening renal function.  Will give a liter of fluids today.   COPD (chronic obstructive pulmonary disease) (HCC) No exacerbation. Had some wheezes after nebulization, added prednisone .   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 Repeated echocardiogram showed moderate to severe mitral valve stenosis, ejection fraction 60 to 65% with grade 1 diastolic dysfunction. Patient still has no evidence of congestive heart failure exacerbation.     Hypothyroidism - Continue home Synthroid    Hypertension Continue to monitor blood pressure.   HLD (  hyperlipidemia) - Continue the home Lipitor   Anemia in chronic kidney  disease (CKD) Reactive thrombocytosis. no iron deficiency, B12 level normal.  Continue to monitor, no need for blood transfusion at this time.  Hemoglobin stable.   Overweight (BMI 25.0-29.9) Body weight 66.7 kg, BMI 26.90 - Encourage losing weight - Exercise healthy diet            Subjective:  Patient still has a cough, less productive.  Short of breath improving.  Physical Exam: Vitals:   07/01/23 0410 07/01/23 0500 07/01/23 0702 07/01/23 0737  BP: (!) 112/59   (!) 119/55  Pulse: 87   94  Resp: 18   18  Temp: 98.6 F (37 C)   98 F (36.7 C)  TempSrc:      SpO2: 99%  94% 97%  Weight:  69.3 kg    Height:       General exam: Appears calm and comfortable  Respiratory system: Decreased breath sounds. Respiratory effort normal. Cardiovascular system: S1 & S2 heard, RRR. No JVD, murmurs, rubs, gallops or clicks. No pedal edema. Gastrointestinal system: Abdomen is nondistended, soft and nontender. No organomegaly or masses felt. Normal bowel sounds heard. Central nervous system: Alert and oriented. No focal neurological deficits. Extremities: Symmetric 5 x 5 power. Skin: No rashes, lesions or ulcers Psychiatry: Judgement and insight appear normal. Mood & affect appropriate.    Data Reviewed:  Lab results reviewed.  Family Communication: None  Disposition: Status is: Inpatient Remains inpatient appropriate because: Severity of disease, IV treatment     Time spent: 35 minutes  Author: Murvin Mana, MD 07/01/2023 1:02 PM  For on call review [email or web address].

## 2023-07-01 NOTE — Plan of Care (Signed)

## 2023-07-01 NOTE — Consult Note (Signed)
 PULMONOLOGY         Date: 07/01/2023,   MRN# [id number] Eileen Wright 06/23/1940     AdmissionWeight: 66.7 kg                 CurrentWeight: 69.3 kg  Referring provider: Dr Laurita   CHIEF COMPLAINT:   Mucus plugging of tracheobronchial tree and productive cough   HISTORY OF PRESENT ILLNESS   ***   PAST MEDICAL HISTORY   Past Medical History:  Diagnosis Date   Anemia    Arthritis    Cardiomyopathy (HCC)    COPD (chronic obstructive pulmonary disease) (HCC)    GERD (gastroesophageal reflux disease)    Heart murmur    Hyperlipemia    Hypertension    Hypothyroidism    Mitral valve insufficiency    Pneumonia      SURGICAL HISTORY   Past Surgical History:  Procedure Laterality Date   APPENDECTOMY     BREAST BIOPSY     TONSILLECTOMY     adenoids   TOTAL HIP ARTHROPLASTY Left 12/03/2020   Procedure: TOTAL HIP ARTHROPLASTY ANTERIOR APPROACH;  Surgeon: Fidel Rogue, MD;  Location: [facility];  Service: Orthopedics;  Laterality: Left;     FAMILY HISTORY   Family History  Problem Relation Age of Onset   Heart failure Mother    Throat cancer Father    Bladder Cancer Sister      SOCIAL HISTORY   Social History   Tobacco Use   Smoking status: Former    Types: Cigarettes   Smokeless tobacco: Never  Vaping Use   Vaping status: Never Used  Substance Use Topics   Alcohol  use: Never   Drug use: Never     MEDICATIONS    Home Medication:    Current Medication:  Current Facility-Administered Medications:    acetaminophen  (TYLENOL ) tablet 650 mg, 650 mg, Oral, Q6H PRN, Niu, Xilin, MD   acetylcysteine  (MUCOMYST ) 20 % nebulizer / oral solution 3 mL, 3 mL, Nebulization, BID, Zhang, Dekui, MD, 3 mL at 07/01/23 0701   albuterol  (PROVENTIL ) (2.5 MG/3ML) 0.083% nebulizer solution 2.5 mg, 2.5 mg, Inhalation, Q4H PRN, Niu, Xilin, MD   atorvastatin  (LIPITOR) tablet 10 mg, 10 mg, Oral, QHS, Niu, Xilin, MD, 10 mg at 06/30/23 2108    budesonide -glycopyrrolate -formoterol  (BREZTRI ) 160-9-4.8 MCG/ACT inhaler 2 puff, 2 puff, Inhalation, BID, Niu, Xilin, MD, 2 puff at 07/01/23 0811   cholecalciferol  (VITAMIN D3) 25 MCG (1000 UNIT) tablet 5,000 Units, 5,000 Units, Oral, Daily, Niu, Xilin, MD, 5,000 Units at 07/01/23 0849   ciprofloxacin (CIPRO) tablet 750 mg, 750 mg, Oral, Q breakfast, Epifanio Alm SQUIBB, MD, 750 mg at 07/01/23 0850   dextromethorphan-guaiFENesin  (MUCINEX  DM) 30-600 MG per 12 hr tablet 1 tablet, 1 tablet, Oral, BID PRN, Niu, Xilin, MD   heparin  injection 5,000 Units, 5,000 Units, Subcutaneous, Q8H, Niu, Xilin, MD, 5,000 Units at 07/01/23 9371   hydrALAZINE  (APRESOLINE ) injection 5 mg, 5 mg, Intravenous, Q2H PRN, Niu, Xilin, MD   ipratropium-albuterol  (DUONEB) 0.5-2.5 (3) MG/3ML nebulizer solution 3 mL, 3 mL, Nebulization, BID, Zhang, Dekui, MD, 3 mL at 07/01/23 0701   lactated ringers  1,000 mL with potassium chloride 20 mEq infusion, , Intravenous, Continuous, Laurita Pillion, MD, Last Rate: 75 mL/hr at 07/01/23 0848, New Bag at 07/01/23 0848   levothyroxine  (SYNTHROID ) tablet 100 mcg, 100 mcg, Oral, Q0600, Niu, Xilin, MD, 100 mcg at 07/01/23 9371   loratadine  (CLARITIN ) tablet 10 mg, 10 mg, Oral, Daily, Niu, Xilin, MD, 10  mg at 07/01/23 0849   melatonin tablet 5 mg, 5 mg, Oral, QHS, Zhang, Dekui, MD, 5 mg at 06/30/23 2107   metoprolol  succinate (TOPROL -XL) 24 hr tablet 12.5 mg, 12.5 mg, Oral, Daily, Elpidio Reyes DEL, MD, 12.5 mg at 07/01/23 0849   metroNIDAZOLE  (FLAGYL ) tablet 500 mg, 500 mg, Oral, Q12H, Epifanio Alm SQUIBB, MD, 500 mg at 07/01/23 9150   ondansetron  (ZOFRAN ) injection 4 mg, 4 mg, Intravenous, Q8H PRN, Niu, Xilin, MD   pantoprazole  (PROTONIX ) EC tablet 40 mg, 40 mg, Oral, Daily, Niu, Xilin, MD, 40 mg at 07/01/23 9150   predniSONE  (DELTASONE ) tablet 40 mg, 40 mg, Oral, Q breakfast, Zhang, Dekui, MD, 40 mg at 07/01/23 9150   senna-docusate (Senokot-S) tablet 2 tablet, 2 tablet, Oral, BID, Zhang, Dekui,  MD, 2 tablet at 07/01/23 9150   sodium bicarbonate  tablet 1,300 mg, 1,300 mg, Oral, BID, Amin, Sumayya, MD, 1,300 mg at 07/01/23 0850    ALLERGIES   Epinephrine , Vancomycin, and Penicillins     REVIEW OF SYSTEMS    Review of Systems:  Gen:  Denies  fever, sweats, chills weigh loss  HEENT: Denies blurred vision, double vision, ear pain, eye pain, hearing loss, nose bleeds, sore throat Cardiac:  No dizziness, chest pain or heaviness, chest tightness,edema Resp:   reports dyspnea chronically  Gi: Denies swallowing difficulty, stomach pain, nausea or vomiting, diarrhea, constipation, bowel incontinence Gu:  Denies bladder incontinence, burning urine Ext:   Denies Joint pain, stiffness or swelling Skin: Denies  skin rash, easy bruising or bleeding or hives Endoc:  Denies polyuria, polydipsia , polyphagia or weight change Psych:   Denies depression, insomnia or hallucinations   Other:  All other systems negative   VS: BP (!) 119/55   Pulse 94   Temp 98 F (36.7 C)   Resp 18   Ht 5' 2 (1.575 m)   Wt 69.3 kg   SpO2 97%   BMI 27.94 kg/m      PHYSICAL EXAM    GENERAL:NAD, no fevers, chills, no weakness no fatigue HEAD: Normocephalic, atraumatic.  EYES: Pupils equal, round, reactive to light. Extraocular muscles intact. No scleral icterus.  MOUTH: Moist mucosal membrane. Dentition intact. No abscess noted.  EAR, NOSE, THROAT: Clear without exudates. No external lesions.  NECK: Supple. No thyromegaly. No nodules. No JVD.  PULMONARY: decreased breath sounds with mild rhonchi worse at bases bilaterally.  CARDIOVASCULAR: S1 and S2. Regular rate and rhythm. No murmurs, rubs, or gallops. No edema. Pedal pulses 2+ bilaterally.  GASTROINTESTINAL: Soft, nontender, nondistended. No masses. Positive bowel sounds. No hepatosplenomegaly.  MUSCULOSKELETAL: No swelling, clubbing, or edema. Range of motion full in all extremities.  NEUROLOGIC: Cranial nerves II through XII are intact.  No gross focal neurological deficits. Sensation intact. Reflexes intact.  SKIN: No ulceration, lesions, rashes, or cyanosis. Skin warm and dry. Turgor intact.  PSYCHIATRIC: Mood, affect within normal limits. The patient is awake, alert and oriented x 3. Insight, judgment intact.       IMAGING    Narrative & Impression  CLINICAL DATA:  Pneumonia.   EXAM: CT CHEST WITHOUT CONTRAST   TECHNIQUE: Multidetector CT imaging of the chest was performed following the standard protocol without IV contrast.   RADIATION DOSE REDUCTION: This exam was performed according to the departmental dose-optimization program which includes automated exposure control, adjustment of the mA and/or kV according to patient size and/or use of iterative reconstruction technique.   COMPARISON:  Chest CT dated struck by chest radiograph dated 06/21/2023.  FINDINGS: Evaluation of this exam is limited in the absence of intravenous contrast.   Cardiovascular: Top-normal cardiac size. No pericardial effusion. Three-vessel coronary vascular calcification and calcification of the mitral annulus. Mild atherosclerotic calcification of the thoracic aorta. No aneurysmal dilatation. The central pulmonary arteries are grossly unremarkable.   Mediastinum/Nodes: No obvious hilar adenopathy. Evaluation however is limited in the absence of intravenous contrast and due to consolidative changes of the lungs. The esophagus is grossly unremarkable. No mediastinal fluid collection.   Lungs/Pleura: Large area of consolidation involving the right lower and right middle lobes as well as areas of consolidation at the left lung base. There is occlusion of the right middle and right lower lobe bronchi likely with mucous impaction or aspirated content. An endobronchial lesion is not excluded. Several ground-glass nodules in the left lower lobe as well as left upper lobe measure up to 9 mm, likely inflammatory to pressures in  etiology. Small bilateral pleural effusions. No pneumothorax.   Upper Abdomen: No acute abnormality.   Musculoskeletal: Osteopenia with degenerative changes. No acute osseous pathology.   IMPRESSION: 1. Occlusion of the right middle and right lower lobe bronchi secondary to mucous impaction or aspirated content with large area of postobstructive atelectasis or infiltrate. Follow-up to resolution recommended to exclude underlying mass. 2. Left lung base density as well as scattered ground-glass nodules in the left lung likely inflammatory/infectious in etiology. Follow-up to resolution after treatment recommended. 3. Small bilateral pleural effusions. 4.  Aortic Atherosclerosis (ICD10-I70.0).     Electronically Signed   By: Vanetta Chou M.D.   On: 06/27/2023 15:53   ASSESSMENT/PLAN   ***            Thank you for allowing me to participate in the care of this patient.   Patient/Family are satisfied with care plan and all questions have been answered.    Provider disclosure: Patient with at least one acute or chronic illness or injury that poses a threat to life or bodily function and is being managed actively during this encounter.  All of the below services have been performed independently by signing provider:  review of prior documentation from internal and or external health records.  Review of previous and current lab results.  Interview and comprehensive assessment during patient visit today. Review of current and previous chest radiographs/CT scans. Discussion of management and test interpretation with health care team and patient/family.   This document was prepared using Dragon voice recognition software and may include unintentional dictation errors.     Rayann Jolley, M.D.  Division of Pulmonary & Critical Care Medicine

## 2023-07-02 ENCOUNTER — Inpatient Hospital Stay

## 2023-07-02 DIAGNOSIS — A419 Sepsis, unspecified organism: Secondary | ICD-10-CM | POA: Diagnosis not present

## 2023-07-02 DIAGNOSIS — N10 Acute tubulo-interstitial nephritis: Secondary | ICD-10-CM | POA: Diagnosis not present

## 2023-07-02 DIAGNOSIS — J449 Chronic obstructive pulmonary disease, unspecified: Secondary | ICD-10-CM | POA: Diagnosis not present

## 2023-07-02 DIAGNOSIS — J189 Pneumonia, unspecified organism: Secondary | ICD-10-CM | POA: Diagnosis not present

## 2023-07-02 LAB — BASIC METABOLIC PANEL WITH GFR
Anion gap: 7 (ref 5–15)
BUN: 44 mg/dL — ABNORMAL HIGH (ref 8–23)
CO2: 20 mmol/L — ABNORMAL LOW (ref 22–32)
Calcium: 10.7 mg/dL — ABNORMAL HIGH (ref 8.9–10.3)
Chloride: 112 mmol/L — ABNORMAL HIGH (ref 98–111)
Creatinine, Ser: 1.67 mg/dL — ABNORMAL HIGH (ref 0.44–1.00)
GFR, Estimated: 30 mL/min — ABNORMAL LOW (ref >60)
Glucose, Bld: 107 mg/dL — ABNORMAL HIGH (ref 70–99)
Potassium: 3.8 mmol/L (ref 3.5–5.1)
Sodium: 139 mmol/L (ref 135–145)

## 2023-07-02 LAB — MAGNESIUM: Magnesium: 2.2 mg/dL (ref 1.7–2.4)

## 2023-07-02 MED ORDER — PANTOPRAZOLE SODIUM 40 MG PO TBEC
40.0000 mg | DELAYED_RELEASE_TABLET | Freq: Two times a day (BID) | ORAL | Status: DC
  Administered 2023-07-02: 40 mg via ORAL
  Filled 2023-07-02: qty 1

## 2023-07-02 NOTE — Plan of Care (Signed)

## 2023-07-02 NOTE — Progress Notes (Signed)
 Progress Note   Patient: Eileen Wright FMW:[id number] DOB: 1940-03-17 DOA: 06/21/2023     10 DOS: the patient was seen and examined on 07/02/2023   Brief hospital course: Eileen Wright is a 83 y.o. female with medical history significant of COPD, HTN, HLD, dCHF, hypothyroidism, CKD 3B, anemia, former smoker, who presents with cough with greenish sputum production and SOB for 3 days. Patient also reports dysuria and burning micturition with left flank pain.  On presentation patient had mild tachycardia and tachypnea, labs with leukocytosis at 20.9, BNP 500, lactic acid 1.1, CO2 of 12, potassium 5.2, AKI with creatinine at 3.37, BUN 105, recent baseline creatinine of 2.23 on 05/24/2023. Chest x-ray with bilateral lower lobe opacity and small bilateral pleural effusions.  Patient also has a profound elevation of procalcitonin level of 53.47.  Patient is seen by ID, started on cefepime .  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  Sputum culture grow Pseudomonas, Serratia and Staphylococcus Lugdunensis.  Chest CT scan was performed on 6/27, showed a mucous plug in the right upper and middle lobe bronchi.  With consolidation. Patient was started on DuoNeb, Mucomyst .   Principal Problem:   Sepsis due to pneumonia [facility]) Active Problems:   COPD (chronic obstructive pulmonary disease) (HCC)   Acute pyelonephritis   Chronic diastolic CHF (congestive heart failure) (HCC)   Hypothyroidism   Hypertension   Metabolic acidosis   Acute renal failure superimposed on stage 3b chronic kidney disease (HCC)   HLD (hyperlipidemia)   Anemia in chronic kidney disease (CKD)   Hyperkalemia   Overweight (BMI 25.0-29.9)   Reactive thrombocytosis   Assessment and Plan: Sepsis due to pneumonia (HCC) Right sided aspiration pneumonia. Patient does not have hypoxemia.  She has a profound elevation of procalcitonin level, which has dropped down to 0.3 after antibiotic treatment.   Still has persistent leukocytosis. Based on CT scan, patient has significant mucous plug, patient also coughed up a large amount of mucus.  I started him DuoNeb and Mucomyst . Appreciate ID consult.  Treated with cefepime  and Flagyl . Per recommendation from ID, patient can be treated with Cipro for 21 days and Flagyl  for 10 days. Patient is also seen by pulmonology, will consider bronchoscopy if condition does not improve. Patient also has dysphagia, has seen by speech therapy, did not adjust her diet.  But her condition is more consistent with aspiration pneumonia. Patient gradually improving, less shortness of breath.  May be able to discharge tomorrow.    Pantoea agglomerans bacteremia. Antibiotic changed to oral Cipro and Flagyl .   Acute pyelonephritis Patient has a positive urinalysis, typical symptoms for urinary infection, with left flank pain, indicating acute pyelonephritis.  CT renal stone study was without any acute abnormality. -Urine cultures with multiple species Treatment should be completed for pyelonephritis    Acute renal failure superimposed on stage 3b chronic kidney disease (HCC) Metabolic acidosis. Hypokalemia. Hypomagnesemia. Renal function slightly worsened today.  She received IV fluids yesterday.   COPD (chronic obstructive pulmonary disease) (HCC) No exacerbation. Had some wheezes after nebulization, added prednisone .   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 Repeated echocardiogram showed moderate to severe mitral valve stenosis, ejection fraction 60 to 65% with grade 1 diastolic dysfunction. Patient still has no evidence of congestive heart failure exacerbation. Patient has left arm edema, performed ultrasound rule out DVT     Hypothyroidism - Continue home Synthroid    Hypertension Continue to monitor blood pressure.  HLD (hyperlipidemia) - Continue the home Lipitor   Anemia in chronic  kidney disease (CKD) Reactive thrombocytosis. no iron deficiency, B12 level normal.  Continue to monitor, no need for blood transfusion at this time.  Hemoglobin stable.   Overweight (BMI 25.0-29.9) Body weight 66.7 kg, BMI 26.90 - Encourage losing weight - Exercise healthy diet        Subjective:  Patient feels better today, less shortness of breath.  Still has large amount of mucus production  Physical Exam: Vitals:   07/02/23 0355 07/02/23 0700 07/02/23 0725 07/02/23 0817  BP: (!) 120/50   (!) 117/46  Pulse: 95   96  Resp: 18   16  Temp: 98.4 F (36.9 C)   97.9 F (36.6 C)  TempSrc:    Oral  SpO2: 96%  96% 97%  Weight:  68.1 kg    Height:       General exam: Appears calm and comfortable  Respiratory system: Clear to auscultation. Respiratory effort normal. Cardiovascular system: S1 & S2 heard, RRR. No JVD, murmurs, rubs, gallops or clicks. No pedal edema. Gastrointestinal system: Abdomen is nondistended, soft and nontender. No organomegaly or masses felt. Normal bowel sounds heard. Central nervous system: Alert and oriented. No focal neurological deficits. Extremities: Symmetric 5 x 5 power. Skin: No rashes, lesions or ulcers Psychiatry: Judgement and insight appear normal. Mood & affect appropriate.    Data Reviewed:  Lab results reviewed.  Family Communication: Daughter updated over the phone.  Disposition: Status is: Inpatient Remains inpatient appropriate because: Severity of disease.     Time spent: 35 minutes  Author: Murvin Mana, MD 07/02/2023 12:08 PM  For on call review [email or web address].

## 2023-07-02 NOTE — TOC Progression Note (Signed)
 Transition of Care [facility]) - Progression Note    Patient Details  Name: Eileen Wright MRN: [id number] Date of Birth: [birth year]  [facility] CM/SW Contact  [name], LCSW Phone Number: 07/02/2023, 1:05 PM  Clinical Narrative:  Patient stated she already has a shower seat and BSC at home.   Expected Discharge Plan: Home w Home Health Services Barriers to Discharge: Continued Medical Work up  Expected Discharge Plan and Services     Post Acute Care Choice: Home Health Living arrangements for the past 2 months: Single Family Home                   DME Agency: NA                   Social Determinants of Health (SDOH) Interventions SDOH Screenings   Food Insecurity: No Food Insecurity (06/22/2023)  Housing: Low Risk  (06/22/2023)  Transportation Needs: No Transportation Needs (06/22/2023)  Utilities: Not At Risk (06/22/2023)  Social Connections: Patient Declined (06/22/2023)  Tobacco Use: Medium Risk (06/21/2023)    Readmission Risk Interventions     No data to display

## 2023-07-02 NOTE — Progress Notes (Signed)
 Physical Therapy Treatment Patient Details Name: Eileen Wright MRN: [id number] DOB: Jun 20, 1940 Today's Date: 07/02/2023   History of Present Illness Pt is an 83 yo female that presented the ED for SOB, sputum.  workup for pneumonia and acute pyelonephritis, potential UTI. PMH of COPD, HTN, HLD, dCHF, hypothyroidism, CKD 3B, anemia, former smoker.    PT Comments  OOB and completes two gait trials to/from doorway with RW and cga x 1.  Remains in chair after session.    If plan is discharge home, recommend the following: A little help with walking and/or transfers;A little help with bathing/dressing/bathroom;Assistance with cooking/housework;Assist for transportation;Help with stairs or ramp for entrance   Can travel by private vehicle        Equipment Recommendations  None recommended by PT    Recommendations for Other Services       Precautions / Restrictions Precautions Precautions: Fall Recall of Precautions/Restrictions: Intact Restrictions Weight Bearing Restrictions Per Provider Order: No     Mobility  Bed Mobility Overal bed mobility: Modified Independent               Patient Response: Cooperative  Transfers Overall transfer level: Needs assistance Equipment used: Rolling walker (2 wheels) Transfers: Sit to/from Stand Sit to Stand: Supervision                Ambulation/Gait Ambulation/Gait assistance: Contact guard assist, Supervision Gait Distance (Feet): 40 Feet Assistive device: Rolling walker (2 wheels) Gait Pattern/deviations: Step-through pattern Gait velocity: decreased     General Gait Details: 40' x 2 with RW and supervision   Stairs             Wheelchair Mobility     Tilt Bed Tilt Bed Patient Response: Cooperative  Modified Rankin (Stroke Patients Only)       Balance Overall balance assessment: Needs assistance Sitting-balance support: Feet supported Sitting balance-Leahy Scale: Good     Standing balance  support: During functional activity, Reliant on assistive device for balance, Bilateral upper extremity supported Standing balance-Leahy Scale: Good Standing balance comment: reliant on RW but is at baseline                            Communication Communication Communication: No apparent difficulties  Cognition Arousal: Alert Behavior During Therapy: WFL for tasks assessed/performed   PT - Cognitive impairments: No apparent impairments                         Following commands: Intact      Cueing Cueing Techniques: Verbal cues, Tactile cues  Exercises      General Comments        Pertinent Vitals/Pain Pain Assessment Pain Assessment: No/denies pain    Home Living                          Prior Function            PT Goals (current goals can now be found in the care plan section) Progress towards PT goals: Progressing toward goals    Frequency    Min 2X/week      PT Plan      Co-evaluation              AM-PAC PT 6 Clicks Mobility   Outcome Measure  Help needed turning from your back to your side while in a flat bed without using  bedrails?: None Help needed moving from lying on your back to sitting on the side of a flat bed without using bedrails?: None Help needed moving to and from a bed to a chair (including a wheelchair)?: A Little Help needed standing up from a chair using your arms (e.g., wheelchair or bedside chair)?: A Little Help needed to walk in hospital room?: A Little Help needed climbing 3-5 steps with a railing? : A Little 6 Click Score: 20    End of Session   Activity Tolerance: Patient tolerated treatment well;Patient limited by fatigue;Other (comment) (limited by SOB) Patient left: in chair;with call bell/phone within reach;with chair alarm set Nurse Communication: Mobility status PT Visit Diagnosis: Other abnormalities of gait and mobility (R26.89);Difficulty in walking, not elsewhere  classified (R26.2);Muscle weakness (generalized) (M62.81)     Time: 1140-1156 PT Time Calculation (min) (ACUTE ONLY): 16 min  Charges:    $Gait Training: 8-22 mins PT General Charges $$ ACUTE PT VISIT: 1 Visit                    Lauraine Gills, PTA 07/02/23, 12:48 PM

## 2023-07-02 NOTE — Progress Notes (Signed)
 PULMONOLOGY         Date: 07/02/2023,   MRN# [id number] Eileen Wright [birth year]     AdmissionWeight: 66.7 kg                 CurrentWeight: 68.1 kg  Referring provider: Dr Laurita   CHIEF COMPLAINT:   Mucus plugging of tracheobronchial tree and productive cough   HISTORY OF PRESENT ILLNESS    This is a pleasant 83 year old female with a history of chronic anemia, osteoarthritis, cardia myopathy, GERD, dyslipidemia, essential hypertension, hypothyroidism, mitral valve prolapse, recurrent pneumonia with a history of COPD as well as diastolic CHF and CKD 3B who came in with complaints of worsening cough and shortness of breath complaining of expectoration of dark thickened inspissated phlegm which is discolored and heavy with a greenish hue. She also reports dysuria and burning upon urination with left flank pain for 1 week prior to admission.  She was admitted to the emergency department for possible UTI with notable abnormal labs including leukocytosis and urinalysis showing many bacteria with leukocyte esterase positive consistent with urinary tract infection.  She also had worsening renal impairment on admission with acute on chronic renal insufficiency as well as abnormal chest x-ray with bilateral lung opacities worse at the right lower lobe.  CT chest was performed with finding of a consolidated infiltrate and debris endobronchially with severe mucous plugging.  PCCM consultation was placed for additional evaluation management.   PAST MEDICAL HISTORY   Past Medical History:  Diagnosis Date   Anemia    Arthritis    Cardiomyopathy (HCC)    COPD (chronic obstructive pulmonary disease) (HCC)    GERD (gastroesophageal reflux disease)    Heart murmur    Hyperlipemia    Hypertension    Hypothyroidism    Mitral valve insufficiency    Pneumonia      SURGICAL HISTORY   Past Surgical History:  Procedure Laterality Date   APPENDECTOMY     BREAST BIOPSY      TONSILLECTOMY     adenoids   TOTAL HIP ARTHROPLASTY Left 12/03/2020   Procedure: TOTAL HIP ARTHROPLASTY ANTERIOR APPROACH;  Surgeon: Fidel Rogue, MD;  Location: [facility];  Service: Orthopedics;  Laterality: Left;     FAMILY HISTORY   Family History  Problem Relation Age of Onset   Heart failure Mother    Throat cancer Father    Bladder Cancer Sister      SOCIAL HISTORY   Social History   Tobacco Use   Smoking status: Former    Types: Cigarettes   Smokeless tobacco: Never  Vaping Use   Vaping status: Never Used  Substance Use Topics   Alcohol  use: Never   Drug use: Never     MEDICATIONS    Home Medication:    Current Medication:  Current Facility-Administered Medications:    acetaminophen  (TYLENOL ) tablet 650 mg, 650 mg, Oral, Q6H PRN, Niu, Xilin, MD   acetylcysteine  (MUCOMYST ) 20 % nebulizer / oral solution 3 mL, 3 mL, Nebulization, BID, Zhang, Dekui, MD, 3 mL at 07/02/23 0723   albuterol  (PROVENTIL ) (2.5 MG/3ML) 0.083% nebulizer solution 2.5 mg, 2.5 mg, Inhalation, Q4H PRN, Niu, Xilin, MD   atorvastatin  (LIPITOR) tablet 10 mg, 10 mg, Oral, QHS, Niu, Xilin, MD, 10 mg at 07/01/23 2140   budesonide -glycopyrrolate -formoterol  (BREZTRI ) 160-9-4.8 MCG/ACT inhaler 2 puff, 2 puff, Inhalation, BID, Niu, Xilin, MD, 2 puff at 07/02/23 0938   cholecalciferol  (VITAMIN D3) 25 MCG (1000 UNIT) tablet 5,000 Units,  5,000 Units, Oral, Daily, Niu, Xilin, MD, 5,000 Units at 07/02/23 9061   ciprofloxacin (CIPRO) tablet 750 mg, 750 mg, Oral, Q breakfast, Epifanio Alm SQUIBB, MD, 750 mg at 07/02/23 9060   dextromethorphan-guaiFENesin  (MUCINEX  DM) 30-600 MG per 12 hr tablet 1 tablet, 1 tablet, Oral, BID PRN, Niu, Xilin, MD   heparin  injection 5,000 Units, 5,000 Units, Subcutaneous, Q8H, Niu, Xilin, MD, 5,000 Units at 07/02/23 9342   hydrALAZINE  (APRESOLINE ) injection 5 mg, 5 mg, Intravenous, Q2H PRN, Niu, Xilin, MD   ipratropium-albuterol  (DUONEB) 0.5-2.5 (3) MG/3ML nebulizer solution 3  mL, 3 mL, Nebulization, BID, Zhang, Dekui, MD, 3 mL at 07/02/23 9277   levothyroxine  (SYNTHROID ) tablet 100 mcg, 100 mcg, Oral, Q0600, Niu, Xilin, MD, 100 mcg at 07/02/23 9342   loratadine  (CLARITIN ) tablet 10 mg, 10 mg, Oral, Daily, Niu, Xilin, MD, 10 mg at 07/02/23 9061   melatonin tablet 5 mg, 5 mg, Oral, QHS, Zhang, Dekui, MD, 5 mg at 07/01/23 2140   metoprolol  succinate (TOPROL -XL) 24 hr tablet 12.5 mg, 12.5 mg, Oral, Daily, Elpidio Reyes DEL, MD, 12.5 mg at 07/02/23 9061   metroNIDAZOLE  (FLAGYL ) tablet 500 mg, 500 mg, Oral, Q12H, Epifanio Alm SQUIBB, MD, 500 mg at 07/02/23 9061   ondansetron  (ZOFRAN ) injection 4 mg, 4 mg, Intravenous, Q8H PRN, Niu, Xilin, MD   pantoprazole  (PROTONIX ) EC tablet 40 mg, 40 mg, Oral, Daily, Niu, Xilin, MD, 40 mg at 07/02/23 9061   predniSONE  (DELTASONE ) tablet 40 mg, 40 mg, Oral, Q breakfast, Zhang, Dekui, MD, 40 mg at 07/02/23 9061   senna-docusate (Senokot-S) tablet 2 tablet, 2 tablet, Oral, BID, Laurita Pillion, MD, 2 tablet at 07/02/23 9061   sodium bicarbonate  tablet 1,300 mg, 1,300 mg, Oral, BID, Amin, Sumayya, MD, 1,300 mg at 07/02/23 9062    ALLERGIES   Epinephrine , Vancomycin, and Penicillins     REVIEW OF SYSTEMS    Review of Systems:  Gen:  Denies  fever, sweats, chills weigh loss  HEENT: Denies blurred vision, double vision, ear pain, eye pain, hearing loss, nose bleeds, sore throat Cardiac:  No dizziness, chest pain or heaviness, chest tightness,edema Resp:   reports dyspnea chronically  Gi: Denies swallowing difficulty, stomach pain, nausea or vomiting, diarrhea, constipation, bowel incontinence Gu:  Denies bladder incontinence, burning urine Ext:   Denies Joint pain, stiffness or swelling Skin: Denies  skin rash, easy bruising or bleeding or hives Endoc:  Denies polyuria, polydipsia , polyphagia or weight change Psych:   Denies depression, insomnia or hallucinations   Other:  All other systems negative   VS: BP (!) 117/46 (BP  Location: Left Arm)   Pulse 96   Temp 97.9 F (36.6 C) (Oral)   Resp 16   Ht 5' 2 (1.575 m)   Wt 68.1 kg   SpO2 97%   BMI 27.46 kg/m      PHYSICAL EXAM    GENERAL:NAD, no fevers, chills, no weakness no fatigue HEAD: Normocephalic, atraumatic.  EYES: Pupils equal, round, reactive to light. Extraocular muscles intact. No scleral icterus.  MOUTH: Moist mucosal membrane. Dentition intact. No abscess noted.  EAR, NOSE, THROAT: Clear without exudates. No external lesions.  NECK: Supple. No thyromegaly. No nodules. No JVD.  PULMONARY: decreased breath sounds with mild rhonchi worse at bases bilaterally.  CARDIOVASCULAR: S1 and S2. Regular rate and rhythm. No murmurs, rubs, or gallops. No edema. Pedal pulses 2+ bilaterally.  GASTROINTESTINAL: Soft, nontender, nondistended. No masses. Positive bowel sounds. No hepatosplenomegaly.  MUSCULOSKELETAL: No swelling, clubbing, or edema. Range  of motion full in all extremities.  NEUROLOGIC: Cranial nerves II through XII are intact. No gross focal neurological deficits. Sensation intact. Reflexes intact.  SKIN: No ulceration, lesions, rashes, or cyanosis. Skin warm and dry. Turgor intact.  PSYCHIATRIC: Mood, affect within normal limits. The patient is awake, alert and oriented x 3. Insight, judgment intact.       IMAGING    Narrative & Impression  CLINICAL DATA:  Pneumonia.   EXAM: CT CHEST WITHOUT CONTRAST   TECHNIQUE: Multidetector CT imaging of the chest was performed following the standard protocol without IV contrast.   RADIATION DOSE REDUCTION: This exam was performed according to the departmental dose-optimization program which includes automated exposure control, adjustment of the mA and/or kV according to patient size and/or use of iterative reconstruction technique.   COMPARISON:  Chest CT dated struck by chest radiograph dated 06/21/2023.   FINDINGS: Evaluation of this exam is limited in the absence of  intravenous contrast.   Cardiovascular: Top-normal cardiac size. No pericardial effusion. Three-vessel coronary vascular calcification and calcification of the mitral annulus. Mild atherosclerotic calcification of the thoracic aorta. No aneurysmal dilatation. The central pulmonary arteries are grossly unremarkable.   Mediastinum/Nodes: No obvious hilar adenopathy. Evaluation however is limited in the absence of intravenous contrast and due to consolidative changes of the lungs. The esophagus is grossly unremarkable. No mediastinal fluid collection.   Lungs/Pleura: Large area of consolidation involving the right lower and right middle lobes as well as areas of consolidation at the left lung base. There is occlusion of the right middle and right lower lobe bronchi likely with mucous impaction or aspirated content. An endobronchial lesion is not excluded. Several ground-glass nodules in the left lower lobe as well as left upper lobe measure up to 9 mm, likely inflammatory to pressures in etiology. Small bilateral pleural effusions. No pneumothorax.   Upper Abdomen: No acute abnormality.   Musculoskeletal: Osteopenia with degenerative changes. No acute osseous pathology.   IMPRESSION: 1. Occlusion of the right middle and right lower lobe bronchi secondary to mucous impaction or aspirated content with large area of postobstructive atelectasis or infiltrate. Follow-up to resolution recommended to exclude underlying mass. 2. Left lung base density as well as scattered ground-glass nodules in the left lung likely inflammatory/infectious in etiology. Follow-up to resolution after treatment recommended. 3. Small bilateral pleural effusions. 4.  Aortic Atherosclerosis (ICD10-I70.0).     Electronically Signed   By: Vanetta Chou M.D.   On: 06/27/2023 15:53   ASSESSMENT/PLAN    Acute exacerbation of COPD    - currently on Breztri  BID as well as albuterol      - Prednisone  40mg   PO daily     - Duoneb q6h    - Mucinex      - Flagyl  500 q12h and Cipro 750 daily     -please encourage incentive spirometry   Right lower lobe mucus plugging      - Patient using mucomyst  currently doing well     - may need to re-image and decide regarding bronchoscopy     - Pseudomonas resp cultures +   Acute on chronic renal insuficiency     Stage 3B renal failure    UTI SEPSIS - PRESENT ON ADMISSION          Thank you for allowing me to participate in the care of this patient.   Patient/Family are satisfied with care plan and all questions have been answered.    Provider disclosure: Patient with at least one  acute or chronic illness or injury that poses a threat to life or bodily function and is being managed actively during this encounter.  All of the below services have been performed independently by signing provider:  review of prior documentation from internal and or external health records.  Review of previous and current lab results.  Interview and comprehensive assessment during patient visit today. Review of current and previous chest radiographs/CT scans. Discussion of management and test interpretation with health care team and patient/family.   This document was prepared using Dragon voice recognition software and may include unintentional dictation errors.     Bekah Igoe, M.D.  Division of Pulmonary & Critical Care Medicine

## 2023-07-03 ENCOUNTER — Inpatient Hospital Stay

## 2023-07-03 LAB — BASIC METABOLIC PANEL WITH GFR
Anion gap: 8 (ref 5–15)
BUN: 40 mg/dL — ABNORMAL HIGH (ref 8–23)
CO2: 20 mmol/L — ABNORMAL LOW (ref 22–32)
Calcium: 10.1 mg/dL (ref 8.9–10.3)
Chloride: 108 mmol/L (ref 98–111)
Creatinine, Ser: 1.7 mg/dL — ABNORMAL HIGH (ref 0.44–1.00)
GFR, Estimated: 30 mL/min — ABNORMAL LOW (ref >60)
Glucose, Bld: 71 mg/dL (ref 70–99)
Potassium: 3.7 mmol/L (ref 3.5–5.1)
Sodium: 136 mmol/L (ref 135–145)

## 2023-07-03 MED ORDER — POLYETHYLENE GLYCOL 3350 17 G PO PACK
17.0000 g | PACK | Freq: Every day | ORAL | Status: DC
  Administered 2023-07-03: 17 g via ORAL
  Filled 2023-07-03 (×2): qty 1

## 2023-07-03 MED ORDER — PREDNISONE 20 MG PO TABS
30.0000 mg | ORAL_TABLET | Freq: Every day | ORAL | Status: DC
  Administered 2023-07-03 – 2023-07-04 (×2): 30 mg via ORAL
  Filled 2023-07-03 (×2): qty 1

## 2023-07-03 NOTE — Plan of Care (Signed)

## 2023-07-03 NOTE — TOC Progression Note (Signed)
 Transition of Care [facility]) - Progression Note    Patient Details  Name: Eileen Wright MRN: [id number] Date of Birth: 1940/07/26  Transition of Care [facility]) CM/SW Contact  Quintella Suzen Jansky, RN Phone Number: 07/03/2023, 1:14 PM  Clinical Narrative:     Monitoring kidney function, being treated for pyelonephritis.   Expected Discharge Plan: Home w Home Health Services Barriers to Discharge: Continued Medical Work up  Expected Discharge Plan and Services     Post Acute Care Choice: Home Health Living arrangements for the past 2 months: Single Family Home                   DME Agency: NA                   Social Determinants of Health (SDOH) Interventions SDOH Screenings   Food Insecurity: No Food Insecurity (06/22/2023)  Housing: Low Risk  (06/22/2023)  Transportation Needs: No Transportation Needs (06/22/2023)  Utilities: Not At Risk (06/22/2023)  Social Connections: Patient Declined (06/22/2023)  Tobacco Use: Medium Risk (06/21/2023)    Readmission Risk Interventions     No data to display

## 2023-07-03 NOTE — Progress Notes (Signed)
 PULMONOLOGY         Date: 07/03/2023,   MRN# [id number] HUBERT DERSTINE 08-08-1940     AdmissionWeight: 66.7 kg                 CurrentWeight: 68.1 kg  Referring provider: Dr Laurita   CHIEF COMPLAINT:   Mucus plugging of tracheobronchial tree and productive cough   HISTORY OF PRESENT ILLNESS    This is a pleasant 83 year old female with a history of chronic anemia, osteoarthritis, cardia myopathy, GERD, dyslipidemia, essential hypertension, hypothyroidism, mitral valve prolapse, recurrent pneumonia with a history of COPD as well as diastolic CHF and CKD 3B who came in with complaints of worsening cough and shortness of breath complaining of expectoration of dark thickened inspissated phlegm which is discolored and heavy with a greenish hue. She also reports dysuria and burning upon urination with left flank pain for 1 week prior to admission.  She was admitted to the emergency department for possible UTI with notable abnormal labs including leukocytosis and urinalysis showing many bacteria with leukocyte esterase positive consistent with urinary tract infection.  She also had worsening renal impairment on admission with acute on chronic renal insufficiency as well as abnormal chest x-ray with bilateral lung opacities worse at the right lower lobe.  CT chest was performed with finding of a consolidated infiltrate and debris endobronchially with severe mucous plugging.  PCCM consultation was placed for additional evaluation management.   07/03/23- patient seen at bedside this morning.  She is reporting clinical improvement.  She is agreeable to trying PT/OT today and would like to get OOB to chair.  She sat in chair yesterday for couple hours felt well.  She lives with daughter at home. She has worsening AKI on BMP this am.  I have dcd her BID protonix  as this can at times contribute to renal impairment.  She does have CKD. Overall her pulmonary process is improved and she can begin DC  planning stages of hospitalization. I anticipate renal function improved by tommorow.  Will repeat CXR this am.    PAST MEDICAL HISTORY   Past Medical History:  Diagnosis Date   Anemia    Arthritis    Cardiomyopathy (HCC)    COPD (chronic obstructive pulmonary disease) (HCC)    GERD (gastroesophageal reflux disease)    Heart murmur    Hyperlipemia    Hypertension    Hypothyroidism    Mitral valve insufficiency    Pneumonia      SURGICAL HISTORY   Past Surgical History:  Procedure Laterality Date   APPENDECTOMY     BREAST BIOPSY     TONSILLECTOMY     adenoids   TOTAL HIP ARTHROPLASTY Left 12/03/2020   Procedure: TOTAL HIP ARTHROPLASTY ANTERIOR APPROACH;  Surgeon: Fidel Rogue, MD;  Location: [facility];  Service: Orthopedics;  Laterality: Left;     FAMILY HISTORY   Family History  Problem Relation Age of Onset   Heart failure Mother    Throat cancer Father    Bladder Cancer Sister      SOCIAL HISTORY   Social History   Tobacco Use   Smoking status: Former    Types: Cigarettes   Smokeless tobacco: Never  Vaping Use   Vaping status: Never Used  Substance Use Topics   Alcohol  use: Never   Drug use: Never     MEDICATIONS    Home Medication:    Current Medication:  Current Facility-Administered Medications:    acetaminophen  (  TYLENOL ) tablet 650 mg, 650 mg, Oral, Q6H PRN, Niu, Xilin, MD   acetylcysteine  (MUCOMYST ) 20 % nebulizer / oral solution 3 mL, 3 mL, Nebulization, BID, Zhang, Dekui, MD, 3 mL at 07/03/23 0700   albuterol  (PROVENTIL ) (2.5 MG/3ML) 0.083% nebulizer solution 2.5 mg, 2.5 mg, Inhalation, Q4H PRN, Niu, Xilin, MD   atorvastatin  (LIPITOR) tablet 10 mg, 10 mg, Oral, QHS, Niu, Xilin, MD, 10 mg at 07/02/23 2222   budesonide -glycopyrrolate -formoterol  (BREZTRI ) 160-9-4.8 MCG/ACT inhaler 2 puff, 2 puff, Inhalation, BID, Niu, Xilin, MD, 2 puff at 07/02/23 2222   cholecalciferol  (VITAMIN D3) 25 MCG (1000 UNIT) tablet 5,000 Units, 5,000 Units, Oral,  Daily, Niu, Xilin, MD, 5,000 Units at 07/02/23 9061   ciprofloxacin (CIPRO) tablet 750 mg, 750 mg, Oral, Q breakfast, Epifanio Alm SQUIBB, MD, 750 mg at 07/02/23 9060   dextromethorphan-guaiFENesin  (MUCINEX  DM) 30-600 MG per 12 hr tablet 1 tablet, 1 tablet, Oral, BID PRN, Niu, Xilin, MD   heparin  injection 5,000 Units, 5,000 Units, Subcutaneous, Q8H, Niu, Xilin, MD, 5,000 Units at 07/03/23 9372   hydrALAZINE  (APRESOLINE ) injection 5 mg, 5 mg, Intravenous, Q2H PRN, Niu, Xilin, MD   ipratropium-albuterol  (DUONEB) 0.5-2.5 (3) MG/3ML nebulizer solution 3 mL, 3 mL, Nebulization, BID, Zhang, Dekui, MD, 3 mL at 07/03/23 0700   levothyroxine  (SYNTHROID ) tablet 100 mcg, 100 mcg, Oral, Q0600, Niu, Xilin, MD, 100 mcg at 07/03/23 9373   loratadine  (CLARITIN ) tablet 10 mg, 10 mg, Oral, Daily, Niu, Xilin, MD, 10 mg at 07/02/23 9061   melatonin tablet 5 mg, 5 mg, Oral, QHS, Zhang, Dekui, MD, 5 mg at 07/02/23 2222   metoprolol  succinate (TOPROL -XL) 24 hr tablet 12.5 mg, 12.5 mg, Oral, Daily, Elpidio Reyes DEL, MD, 12.5 mg at 07/02/23 9061   metroNIDAZOLE  (FLAGYL ) tablet 500 mg, 500 mg, Oral, Q12H, Epifanio Alm SQUIBB, MD, 500 mg at 07/02/23 2222   ondansetron  (ZOFRAN ) injection 4 mg, 4 mg, Intravenous, Q8H PRN, Niu, Xilin, MD   pantoprazole  (PROTONIX ) EC tablet 40 mg, 40 mg, Oral, BID AC, Zhang, Dekui, MD, 40 mg at 07/02/23 1650   predniSONE  (DELTASONE ) tablet 40 mg, 40 mg, Oral, Q breakfast, Laurita Pillion, MD, 40 mg at 07/02/23 9061   senna-docusate (Senokot-S) tablet 2 tablet, 2 tablet, Oral, BID, Laurita Pillion, MD, 2 tablet at 07/02/23 2222   sodium bicarbonate  tablet 1,300 mg, 1,300 mg, Oral, BID, Amin, Sumayya, MD, 1,300 mg at 07/02/23 2222    ALLERGIES   Epinephrine , Vancomycin, and Penicillins     REVIEW OF SYSTEMS    Review of Systems:  Gen:  Denies  fever, sweats, chills weigh loss  HEENT: Denies blurred vision, double vision, ear pain, eye pain, hearing loss, nose bleeds, sore  throat Cardiac:  No dizziness, chest pain or heaviness, chest tightness,edema Resp:   reports dyspnea chronically  Gi: Denies swallowing difficulty, stomach pain, nausea or vomiting, diarrhea, constipation, bowel incontinence Gu:  Denies bladder incontinence, burning urine Ext:   Denies Joint pain, stiffness or swelling Skin: Denies  skin rash, easy bruising or bleeding or hives Endoc:  Denies polyuria, polydipsia , polyphagia or weight change Psych:   Denies depression, insomnia or hallucinations   Other:  All other systems negative   VS: BP (!) 117/54 (BP Location: Left Arm)   Pulse 81   Temp 98.1 F (36.7 C) (Oral)   Resp 16   Ht 5' 2 (1.575 m)   Wt 68.1 kg   SpO2 94%   BMI 27.46 kg/m      PHYSICAL EXAM  GENERAL:NAD, no fevers, chills, no weakness no fatigue HEAD: Normocephalic, atraumatic.  EYES: Pupils equal, round, reactive to light. Extraocular muscles intact. No scleral icterus.  MOUTH: Moist mucosal membrane. Dentition intact. No abscess noted.  EAR, NOSE, THROAT: Clear without exudates. No external lesions.  NECK: Supple. No thyromegaly. No nodules. No JVD.  PULMONARY: decreased breath sounds with mild rhonchi worse at bases bilaterally.  CARDIOVASCULAR: S1 and S2. Regular rate and rhythm. No murmurs, rubs, or gallops. No edema. Pedal pulses 2+ bilaterally.  GASTROINTESTINAL: Soft, nontender, nondistended. No masses. Positive bowel sounds. No hepatosplenomegaly.  MUSCULOSKELETAL: No swelling, clubbing, or edema. Range of motion full in all extremities.  NEUROLOGIC: Cranial nerves II through XII are intact. No gross focal neurological deficits. Sensation intact. Reflexes intact.  SKIN: No ulceration, lesions, rashes, or cyanosis. Skin warm and dry. Turgor intact.  PSYCHIATRIC: Mood, affect within normal limits. The patient is awake, alert and oriented x 3. Insight, judgment intact.       IMAGING    Narrative & Impression  CLINICAL DATA:  Pneumonia.    EXAM: CT CHEST WITHOUT CONTRAST   TECHNIQUE: Multidetector CT imaging of the chest was performed following the standard protocol without IV contrast.   RADIATION DOSE REDUCTION: This exam was performed according to the departmental dose-optimization program which includes automated exposure control, adjustment of the mA and/or kV according to patient size and/or use of iterative reconstruction technique.   COMPARISON:  Chest CT dated struck by chest radiograph dated 06/21/2023.   FINDINGS: Evaluation of this exam is limited in the absence of intravenous contrast.   Cardiovascular: Top-normal cardiac size. No pericardial effusion. Three-vessel coronary vascular calcification and calcification of the mitral annulus. Mild atherosclerotic calcification of the thoracic aorta. No aneurysmal dilatation. The central pulmonary arteries are grossly unremarkable.   Mediastinum/Nodes: No obvious hilar adenopathy. Evaluation however is limited in the absence of intravenous contrast and due to consolidative changes of the lungs. The esophagus is grossly unremarkable. No mediastinal fluid collection.   Lungs/Pleura: Large area of consolidation involving the right lower and right middle lobes as well as areas of consolidation at the left lung base. There is occlusion of the right middle and right lower lobe bronchi likely with mucous impaction or aspirated content. An endobronchial lesion is not excluded. Several ground-glass nodules in the left lower lobe as well as left upper lobe measure up to 9 mm, likely inflammatory to pressures in etiology. Small bilateral pleural effusions. No pneumothorax.   Upper Abdomen: No acute abnormality.   Musculoskeletal: Osteopenia with degenerative changes. No acute osseous pathology.   IMPRESSION: 1. Occlusion of the right middle and right lower lobe bronchi secondary to mucous impaction or aspirated content with large area of postobstructive  atelectasis or infiltrate. Follow-up to resolution recommended to exclude underlying mass. 2. Left lung base density as well as scattered ground-glass nodules in the left lung likely inflammatory/infectious in etiology. Follow-up to resolution after treatment recommended. 3. Small bilateral pleural effusions. 4.  Aortic Atherosclerosis (ICD10-I70.0).     Electronically Signed   By: Vanetta Chou M.D.   On: 06/27/2023 15:53   ASSESSMENT/PLAN    Acute exacerbation of COPD    - currently on Breztri  BID as well as albuterol      - Prednisone  40mg  PO daily     - Duoneb q6h    - Mucinex      - Flagyl  500 q12h and Cipro 750 daily     -please encourage incentive spirometry   Right lower lobe  mucus plugging      - Patient using mucomyst  currently doing well     - may need to re-image and decide regarding bronchoscopy     - Pseudomonas resp cultures +   Acute on chronic renal insuficiency     Stage 3B renal failure    UTI SEPSIS - PRESENT ON ADMISSION          Thank you for allowing me to participate in the care of this patient.   Patient/Family are satisfied with care plan and all questions have been answered.    Provider disclosure: Patient with at least one acute or chronic illness or injury that poses a threat to life or bodily function and is being managed actively during this encounter.  All of the below services have been performed independently by signing provider:  review of prior documentation from internal and or external health records.  Review of previous and current lab results.  Interview and comprehensive assessment during patient visit today. Review of current and previous chest radiographs/CT scans. Discussion of management and test interpretation with health care team and patient/family.   This document was prepared using Dragon voice recognition software and may include unintentional dictation errors.     Dulcemaria Bula, M.D.  Division of Pulmonary &  Critical Care Medicine

## 2023-07-03 NOTE — Progress Notes (Signed)
 Occupational Therapy Treatment Patient Details Name: Eileen Wright MRN: [id number] DOB: [birth year] Today's Date: 07/03/2023   History of present illness Pt is an 83 yo female that presented the ED for SOB, sputum.  workup for pneumonia and acute pyelonephritis, potential UTI. PMH of COPD, HTN, HLD, dCHF, hypothyroidism, CKD 3B, anemia, former smoker.   OT comments  Pt seen for OT tx, making progress towards goals. Requires CGA progressing to supervision for functional transfers + mobility to rise from recliner and walk around bed. Seated rest break for ~1 min while pt answered phone call, CGA for safety during standing grooming tasks, no LOB observed while pt reached outside BOS. Discharge recommendation appropriate, will continue to follow OT POC.       If plan is discharge home, recommend the following:  A little help with walking and/or transfers;A little help with bathing/dressing/bathroom;Help with stairs or ramp for entrance   Equipment Recommendations  BSC/3in1       Precautions / Restrictions Precautions Precautions: Fall Recall of Precautions/Restrictions: Intact       Mobility Bed Mobility Overal bed mobility: Needs Assistance         Sit to supine: Min assist   General bed mobility comments: minA for BLE (pt uses leg lifter at baseline)    Transfers Overall transfer level: Needs assistance Equipment used: Rolling walker (2 wheels) Transfers: Sit to/from Stand Sit to Stand: Supervision, Contact guard assist           General transfer comment: Pt stood from recliner with CGA, supervision x2 STS transfers from EOB     Balance Overall balance assessment: Needs assistance Sitting-balance support: Feet supported Sitting balance-Leahy Scale: Good     Standing balance support: During functional activity, Reliant on assistive device for balance, Bilateral upper extremity supported Standing balance-Leahy Scale: Good Standing balance comment: reliant on RW but  is at baseline                           ADL either performed or assessed with clinical judgement   ADL Overall ADL's : Needs assistance/impaired     Grooming: Standing;Wash/dry face;Wash/dry hands Grooming Details (indicate cue type and reason): sink level, CGA for safety. no LOB whil reaching outside BOS                             Functional mobility during ADLs: Rolling walker (2 wheels);Contact guard assist General ADL Comments: CGA + RW t/f sink from recliner      Cognition Arousal: Alert Behavior During Therapy: WFL for tasks assessed/performed Cognition: No apparent impairments                                                 General Comments VSS. Pt mildly SOB with movement, SpO2 on RA 99%, HR 88.    Pertinent Vitals/ Pain       Pain Assessment Pain Assessment: No/denies pain         Frequency  Min 3X/week        Progress Toward Goals  OT Goals(current goals can now be found in the care plan section)  Progress towards OT goals: Progressing toward goals  Acute Rehab OT Goals OT Goal Formulation: With patient Time For Goal Achievement: 07/07/23 Potential to Achieve Goals:  Good  Plan         AM-PAC OT 6 Clicks Daily Activity     Outcome Measure   Help from another person eating meals?: None Help from another person taking care of personal grooming?: A Little Help from another person toileting, which includes using toliet, bedpan, or urinal?: A Little Help from another person bathing (including washing, rinsing, drying)?: A Little Help from another person to put on and taking off regular upper body clothing?: None Help from another person to put on and taking off regular lower body clothing?: A Lot 6 Click Score: 19    End of Session Equipment Utilized During Treatment: Rolling walker (2 wheels)  OT Visit Diagnosis: Other abnormalities of gait and mobility (R26.89);Muscle weakness (generalized)  (M62.81)   Activity Tolerance Patient tolerated treatment well   Patient Left in bed;with call bell/phone within reach;with bed alarm set   Nurse Communication Mobility status        Time: 8398-8383 OT Time Calculation (min): 15 min  Charges: OT General Charges $OT Visit: 1 Visit OT Treatments $Self Care/Home Management : 8-22 mins  Thai Burgueno L. Mishawn Didion, OTR/L  07/03/23, 4:34 PM

## 2023-07-03 NOTE — Progress Notes (Signed)
 Progress Note   Patient: Eileen Wright FMW:[id number] DOB: 11/28/1940 DOA: 06/21/2023     11 DOS: the patient was seen and examined on 07/03/2023   Brief hospital course: Eileen Wright is a 83 y.o. female with medical history significant of COPD, HTN, HLD, dCHF, hypothyroidism, CKD 3B, anemia, former smoker, who presents with cough with greenish sputum production and SOB for 3 days. Patient also reports dysuria and burning micturition with left flank pain.  On presentation patient had mild tachycardia and tachypnea, labs with leukocytosis at 20.9, BNP 500, lactic acid 1.1, CO2 of 12, potassium 5.2, AKI with creatinine at 3.37, BUN 105, recent baseline creatinine of 2.23 on 05/24/2023. Chest x-ray with bilateral lower lobe opacity and small bilateral pleural effusions.  Patient also has a profound elevation of procalcitonin level of 53.47.  Patient is seen by ID, started on cefepime .  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  Sputum culture grow Pseudomonas, Serratia and Staphylococcus Lugdunensis.  Chest CT scan was performed on 6/27, showed a mucous plug in the right upper and middle lobe bronchi.  With consolidation. Patient was started on DuoNeb, Mucomyst .   Principal Problem:   Sepsis due to pneumonia [facility]) Active Problems:   COPD (chronic obstructive pulmonary disease) (HCC)   Acute pyelonephritis   Chronic diastolic CHF (congestive heart failure) (HCC)   Hypothyroidism   Hypertension   Metabolic acidosis   Acute renal failure superimposed on stage 3b chronic kidney disease (HCC)   HLD (hyperlipidemia)   Anemia in chronic kidney disease (CKD)   Hyperkalemia   Overweight (BMI 25.0-29.9)   Reactive thrombocytosis   Assessment and Plan:  Sepsis due to pneumonia (HCC) Right sided aspiration pneumonia. Patient does not have hypoxemia.  She has a profound elevation of procalcitonin level, which has dropped down to 0.3 after antibiotic treatment.   Still has persistent leukocytosis. Based on CT scan, patient has significant mucous plug, patient also coughed up a large amount of mucus.  I started him DuoNeb and Mucomyst . Appreciate ID consult.  Treated with cefepime  and Flagyl . Per recommendation from ID, patient can be treated with Cipro for 21 days and Flagyl  for 10 days. Patient is also seen by pulmonology, will consider bronchoscopy if condition does not improve. Patient also has dysphagia, has seen by speech therapy, did not adjust her diet.  But her condition is more consistent with aspiration pneumonia. Condition continued to improve.  Continue current treatment.     Pantoea agglomerans bacteremia. Antibiotic changed to oral Cipro and Flagyl .   Acute pyelonephritis Patient has a positive urinalysis, typical symptoms for urinary infection, with left flank pain, indicating acute pyelonephritis.  CT renal stone study was without any acute abnormality. -Urine cultures with multiple species Treatment should be completed for pyelonephritis    Acute renal failure superimposed on stage 3b chronic kidney disease (HCC) Metabolic acidosis. Hypokalemia. Hypomagnesemia. Patient had a worsening renal function at admission, initially improved, now kidney function gradually getting worse.  Creatinine today 1.7, reviewed prior labs, this is close to her baseline.  If not worse tomorrow, patient can be discharged home. Renal ultrasound did not show any acute changes or hydronephrosis.   COPD (chronic obstructive pulmonary disease) (HCC) No exacerbation. Had some wheezes after nebulization, added prednisone , taper started.   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 Repeated echocardiogram showed moderate to severe mitral valve stenosis, ejection fraction 60 to 65% with grade 1 diastolic dysfunction. Patient still  has no evidence of congestive heart failure exacerbation. Patient has  left arm swelling, ultrasound did not show any DVT, did show superficial thrombosis.  No need for anticoag.     Hypothyroidism - Continue home Synthroid    Hypertension Continue to monitor blood pressure.   HLD (hyperlipidemia) - Continue the home Lipitor   Anemia in chronic kidney disease (CKD) Reactive thrombocytosis. no iron deficiency, B12 level normal.  Continue to monitor, no need for blood transfusion at this time.  Hemoglobin stable.   Overweight (BMI 25.0-29.9) Body weight 66.7 kg, BMI 26.90 - Encourage losing weight - Exercise healthy diet     Subjective:  Patient feels better, less shortness of breath.  Physical Exam: Vitals:   07/02/23 2024 07/03/23 0357 07/03/23 0703 07/03/23 0832  BP: (!) 106/51 (!) 117/54  (!) 115/55  Pulse: 90 81  89  Resp: 17 16    Temp: 97.8 F (36.6 C) 98.1 F (36.7 C)    TempSrc: Oral Oral    SpO2: 98% 97% 94% 96%  Weight:      Height:       General exam: Appears calm and comfortable  Respiratory system: Decreased breath sounds but more air movement today. Respiratory effort normal. Cardiovascular system: S1 & S2 heard, RRR. No JVD, murmurs, rubs, gallops or clicks. No pedal edema. Gastrointestinal system: Abdomen is nondistended, soft and nontender. No organomegaly or masses felt. Normal bowel sounds heard. Central nervous system: Alert and oriented. No focal neurological deficits. Extremities: Symmetric 5 x 5 power. Skin: No rashes, lesions or ulcers Psychiatry: Judgement and insight appear normal. Mood & affect appropriate.    Data Reviewed:  Ultrasound results chest x-ray results and lab results reviewed  Family Communication: None  Disposition: Status is: Inpatient Remains inpatient appropriate because: Severity of disease.     Time spent: 35 minutes  Author: Murvin Mana, MD 07/03/2023 11:50 AM  For on call review [email or web address].

## 2023-07-04 ENCOUNTER — Other Ambulatory Visit: Payer: Self-pay

## 2023-07-04 LAB — BASIC METABOLIC PANEL WITH GFR
Anion gap: 8 (ref 5–15)
BUN: 35 mg/dL — ABNORMAL HIGH (ref 8–23)
CO2: 23 mmol/L (ref 22–32)
Calcium: 10 mg/dL (ref 8.9–10.3)
Chloride: 105 mmol/L (ref 98–111)
Creatinine, Ser: 1.53 mg/dL — ABNORMAL HIGH (ref 0.44–1.00)
GFR, Estimated: 34 mL/min — ABNORMAL LOW (ref >60)
Glucose, Bld: 78 mg/dL (ref 70–99)
Potassium: 3.5 mmol/L (ref 3.5–5.1)
Sodium: 136 mmol/L (ref 135–145)

## 2023-07-04 MED ORDER — METRONIDAZOLE 500 MG PO TABS
500.0000 mg | ORAL_TABLET | Freq: Two times a day (BID) | ORAL | 0 refills | Status: AC
Start: 2023-07-04 — End: 2023-07-07
  Filled 2023-07-04: qty 6, 3d supply, fill #0

## 2023-07-04 MED ORDER — IPRATROPIUM-ALBUTEROL 0.5-2.5 (3) MG/3ML IN SOLN
3.0000 mL | Freq: Two times a day (BID) | RESPIRATORY_TRACT | 0 refills | Status: AC
Start: 2023-07-04 — End: ?
  Filled 2023-07-04: qty 360, 60d supply, fill #0

## 2023-07-04 MED ORDER — POTASSIUM CHLORIDE CRYS ER 20 MEQ PO TBCR
40.0000 meq | EXTENDED_RELEASE_TABLET | Freq: Once | ORAL | Status: AC
  Administered 2023-07-04: 40 meq via ORAL
  Filled 2023-07-04: qty 2

## 2023-07-04 MED ORDER — PREDNISONE 10 MG PO TABS
ORAL_TABLET | ORAL | 0 refills | Status: AC
  Filled 2023-07-04: qty 15, 8d supply, fill #0

## 2023-07-04 MED ORDER — CIPROFLOXACIN HCL 750 MG PO TABS
750.0000 mg | ORAL_TABLET | Freq: Every day | ORAL | 0 refills | Status: AC
  Filled 2023-07-04: qty 8, 8d supply, fill #0

## 2023-07-04 NOTE — Discharge Summary (Signed)
 Physician Discharge Summary   Patient: Eileen Wright MRN: [id number] DOB: 1940/11/23  Admit date:     06/21/2023  Discharge date: 07/04/23  Discharge Physician: Murvin Mana   PCP: Joshua Ronal BRAVO, MD   Recommendations at discharge:   Follow-up with PCP in 1 week. Follow-up with pulmonology in 2 weeks  Discharge Diagnoses: Principal Problem:   Sepsis due to pneumonia [facility]) Active Problems:   COPD (chronic obstructive pulmonary disease) (HCC)   Acute pyelonephritis   Chronic diastolic CHF (congestive heart failure) (HCC)   Hypothyroidism   Hypertension   Metabolic acidosis   Acute renal failure superimposed on stage 3b chronic kidney disease (HCC)   HLD (hyperlipidemia)   Anemia in chronic kidney disease (CKD)   Hyperkalemia   Overweight (BMI 25.0-29.9)   Reactive thrombocytosis  Resolved Problems:   * No resolved hospital problems. *  Hospital Course: Eileen Wright is a 83 y.o. female with medical history significant of COPD, HTN, HLD, dCHF, hypothyroidism, CKD 3B, anemia, former smoker, who presents with cough with greenish sputum production and SOB for 3 days. Patient also reports dysuria and burning micturition with left flank pain.  On presentation patient had mild tachycardia and tachypnea, labs with leukocytosis at 20.9, BNP 500, lactic acid 1.1, CO2 of 12, potassium 5.2, AKI with creatinine at 3.37, BUN 105, recent baseline creatinine of 2.23 on 05/24/2023. Chest x-ray with bilateral lower lobe opacity and small bilateral pleural effusions.  Patient also has a profound elevation of procalcitonin level of 53.47.  Patient is seen by ID, started on cefepime .  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  Sputum culture grow Pseudomonas, Serratia and Staphylococcus Lugdunensis.  Chest CT scan was performed on 6/27, showed a mucous plug in the right upper and middle lobe bronchi.  With consolidation. Patient was started on DuoNeb,  Mucomyst .  Assessment and Plan: Sepsis due to pneumonia [facility]) Right sided aspiration pneumonia. Patient does not have hypoxemia.  She has a profound elevation of procalcitonin level, which has dropped down to 0.3 after antibiotic treatment.  Still has persistent leukocytosis. Based on CT scan, patient has significant mucous plug, patient also coughed up a large amount of mucus.  I started him DuoNeb and Mucomyst . Appreciate ID consult.  Treated with cefepime  and Flagyl . Per recommendation from ID, patient can be treated with Cipro for 21 days and Flagyl  for 10 days. Patient is also seen by pulmonology, will consider bronchoscopy if condition does not improve. Patient also has dysphagia, has seen by speech therapy, did not adjust her diet.  But her condition is more consistent with aspiration pneumonia. Condition have improved, medically stable for discharge.  Will continue with nebulization at home.  Continue finish up steroid taper.  Patient be followed by PCP and pulmonology as outpatient.     Pantoea agglomerans bacteremia. Antibiotic changed to oral Cipro and Flagyl .   Acute pyelonephritis Patient has a positive urinalysis, typical symptoms for urinary infection, with left flank pain, indicating acute pyelonephritis.  CT renal stone study was without any acute abnormality. -Urine cultures with multiple species Treatment should be completed for pyelonephritis    Acute renal failure superimposed on stage 3b chronic kidney disease (HCC) Metabolic acidosis. Hypokalemia. Hypomagnesemia. Patient had a worsening renal function at admission, initially improved, now kidney function gradually getting worse.  Creatinine today 1.7, reviewed prior labs, this is close to her baseline.  If not worse tomorrow, patient can be discharged home. Renal ultrasound did not show any  acute changes or hydronephrosis. Renal function finally improved.  Metabolic acidosis also improved.  Electrolytes are  normalized.   COPD (chronic obstructive pulmonary disease) (HCC) No exacerbation. Had some wheezes after nebulization, added prednisone , taper started.   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 Repeated echocardiogram showed moderate to severe mitral valve stenosis, ejection fraction 60 to 65% with grade 1 diastolic dysfunction. Patient still has no evidence of congestive heart failure exacerbation. Patient has left arm swelling, ultrasound did not show any DVT, did show superficial thrombosis.  No need for anticoag.     Hypothyroidism - Continue home Synthroid    Hypertension Continue to monitor blood pressure.   HLD (hyperlipidemia) - Continue the home Lipitor   Anemia in chronic kidney disease (CKD) Reactive thrombocytosis. no iron deficiency, B12 level normal.  Continue to monitor, no need for blood transfusion at this time.  Hemoglobin stable.   Overweight (BMI 25.0-29.9) Body weight 66.7 kg, BMI 26.90 - Encourage losing weight - Exercise healthy diet       Consultants: Pulmonology. Procedures performed: None  Disposition: Home health Diet recommendation:  Discharge Diet Orders (From admission, onward)     Start     Ordered   07/04/23 0000  Diet - low sodium heart healthy        07/04/23 1034           Cardiac diet DISCHARGE MEDICATION: Allergies as of 07/04/2023       Reactions   Epinephrine  Hives   Vancomycin Hives   Penicillins    Redness around injection site        Medication List     STOP taking these medications    ferrous sulfate  325 (65 FE) MG tablet   HYDROcodone -acetaminophen  5-325 MG tablet Commonly known as: NORCO/VICODIN   lisinopril-hydrochlorothiazide 10-12.5 MG tablet Commonly known as: ZESTORETIC   magnesium  oxide 400 (240 Mg) MG tablet Commonly known as: MAG-OX   Multi-Vitamins Tabs   ondansetron  4 MG tablet Commonly known as: ZOFRAN    senna 8.6 MG Tabs  tablet Commonly known as: SENOKOT   spironolactone  25 MG tablet Commonly known as: ALDACTONE    torsemide  20 MG tablet Commonly known as: DEMADEX        TAKE these medications    acetaminophen  325 MG tablet Commonly known as: TYLENOL  Take 1-2 tablets (325-650 mg total) by mouth every 6 (six) hours as needed for mild pain (pain score 1-3 or temp > 100.5).   albuterol  108 (90 Base) MCG/ACT inhaler Commonly known as: VENTOLIN  HFA Inhale 1-2 puffs into the lungs every 6 (six) hours as needed for shortness of breath.   atorvastatin  10 MG tablet Commonly known as: LIPITOR Take 10 mg by mouth at bedtime.   cetirizine 10 MG tablet Commonly known as: ZYRTEC Take 10 mg by mouth daily.   ciprofloxacin 750 MG tablet Commonly known as: CIPRO Take 1 tablet (750 mg total) by mouth daily with breakfast for 8 days. Start taking on: July 05, 2023   docusate sodium  100 MG capsule Commonly known as: COLACE Take 1 capsule (100 mg total) by mouth 2 (two) times daily. What changed:  when to take this reasons to take this   fluticasone -salmeterol 500-50 MCG/ACT Aepb Commonly known as: ADVAIR Inhale 1 puff into the lungs in the morning and at bedtime.   ipratropium-albuterol  0.5-2.5 (3) MG/3ML Soln Commonly known as: DUONEB Take 3 mLs by nebulization 2 (two) times daily.   levothyroxine  100 MCG tablet Commonly known  as: SYNTHROID  Take 100 mcg by mouth daily. What changed: Another medication with the same name was removed. Continue taking this medication, and follow the directions you see here.   metoprolol  succinate 25 MG 24 hr tablet Commonly known as: TOPROL -XL Take 25 mg by mouth daily.   metroNIDAZOLE  500 MG tablet Commonly known as: FLAGYL  Take 1 tablet (500 mg total) by mouth every 12 (twelve) hours for 3 days.   omeprazole 20 MG capsule Commonly known as: PRILOSEC Take 20 mg by mouth daily.   predniSONE  10 MG tablet Commonly known as: DELTASONE  Take 3 tablets (30 mg  total) by mouth daily for 2 days, THEN 2 tablets (20 mg total) daily for 3 days, THEN 1 tablet (10 mg total) daily for 3 days. Start taking on: July 04, 2023   Spiriva  HandiHaler 18 MCG inhalation capsule Generic drug: tiotropium Place 18 mcg into inhaler and inhale daily.   Vitamin D3 125 MCG (5000 UT) Caps Take 5,000 Units by mouth daily.               Durable Medical Equipment  (From admission, onward)           Start     Ordered   07/04/23 1036  For home use only DME Nebulizer machine  Once       Question Answer Comment  Patient needs a nebulizer to treat with the following condition COPD exacerbation (HCC)   Length of Need 6 Months   Additional equipment included Administration kit      07/04/23 1035            Follow-up Information     Joshua Ronal BRAVO, MD Follow up in 1 week(s).   Specialty: Internal Medicine        Parris Manna, MD Follow up in 2 week(s).   Specialty: Pulmonary Disease Contact information: [address] [place] [address] [phone]                Discharge Exam: Fredricka Weights   07/01/23 0500 07/02/23 0700 07/04/23 0452  Weight: 69.3 kg 68.1 kg 74.6 kg   General exam: Appears calm and comfortable  Respiratory system: Decreased breath sounds. Respiratory effort normal. Cardiovascular system: S1 & S2 heard, RRR. No JVD, murmurs, rubs, gallops or clicks. No pedal edema. Gastrointestinal system: Abdomen is nondistended, soft and nontender. No organomegaly or masses felt. Normal bowel sounds heard. Central nervous system: Alert and oriented. No focal neurological deficits. Extremities: Symmetric 5 x 5 power. Skin: No rashes, lesions or ulcers Psychiatry: Judgement and insight appear normal. Mood & affect appropriate.    Condition at discharge: good  The results of significant diagnostics from this hospitalization (including imaging, microbiology, ancillary and laboratory) are listed below for reference.    Imaging Studies: US  RENAL Result Date: 07/03/2023 CLINICAL DATA:  Acute renal failure EXAM: RENAL / URINARY TRACT ULTRASOUND COMPLETE COMPARISON:  Noncontrast CT 06/22/2023 FINDINGS: Right Kidney: Renal measurements: 9.3 x 4.0 x 4.7 cm = volume: 90.5 mL. No collecting system dilatation. Mild perinephric fluid. Left Kidney: Renal measurements: 9.7 x 4.9 x 4.1 cm = volume: 103.2 mL. Echogenicity within normal limits. No mass or hydronephrosis visualized. Bladder: Appears normal for degree of bladder distention. Other: None. IMPRESSION: No collecting system dilatation. Trace right-sided perinephric fluid. Electronically Signed   By: Ranell Bring M.D.   On: 07/03/2023 10:29   DG Chest Port 1 View Result Date: 07/03/2023 CLINICAL DATA:  Right lower lobe infiltrate. EXAM: PORTABLE CHEST 1  VIEW COMPARISON:  Chest radiograph dated 06/29/2023. FINDINGS: Persistent right lung base opacity and small right pleural effusion. Similar appearance of left lung base atelectasis or infiltrate. No pneumothorax with there is stable cardiomegaly. Mild vascular congestion calcification of the mitral annulus. No acute osseous pathology. IMPRESSION: No significant interval change. Electronically Signed   By: Vanetta Chou M.D.   On: 07/03/2023 09:18   US  Venous Img Upper Uni Left (DVT) Result Date: 07/02/2023 CLINICAL DATA:  Left upper arm edema EXAM: LEFT UPPER EXTREMITY VENOUS DOPPLER ULTRASOUND TECHNIQUE: Gray-scale sonography with graded compression, as well as color Doppler and duplex ultrasound were performed to evaluate the upper extremity deep venous system from the level of the subclavian vein and including the jugular, axillary, basilic, radial, ulnar and upper cephalic vein. Spectral Doppler was utilized to evaluate flow at rest and with distal augmentation maneuvers. COMPARISON:  None Available. FINDINGS: Contralateral Subclavian Vein: Respiratory phasicity is normal and symmetric with the symptomatic side. No  evidence of thrombus. Normal compressibility. Internal Jugular Vein: No evidence of thrombus. Normal compressibility, respiratory phasicity and response to augmentation. Subclavian Vein: No evidence of thrombus. Normal compressibility, respiratory phasicity and response to augmentation. Axillary Vein: No evidence of thrombus. Normal compressibility, respiratory phasicity and response to augmentation. Cephalic Vein: Abnormal. Poorly compressible cephalic vein in the left upper arm consistent with superficial thrombosis. No evidence of color flow on color Doppler imaging. Basilic Vein: No evidence of thrombus. Normal compressibility, respiratory phasicity and response to augmentation. Brachial Veins: No evidence of thrombus. Normal compressibility, respiratory phasicity and response to augmentation. Radial Veins: No evidence of thrombus. Normal compressibility, respiratory phasicity and response to augmentation. Ulnar Veins: No evidence of thrombus. Normal compressibility, respiratory phasicity and response to augmentation. Venous Reflux:  None visualized. Other Findings:  None visualized. IMPRESSION: 1. Positive for segmental superficial thrombosis of the cephalic vein in the upper arm. 2. No evidence of deep venous thrombosis. Electronically Signed   By: Wilkie Lent M.D.   On: 07/02/2023 16:23   ECHOCARDIOGRAM COMPLETE Result Date: 06/30/2023    ECHOCARDIOGRAM REPORT   Patient Name:   Eileen Wright Date of Exam: 06/30/2023 Medical Rec #:  [id number]      Height:       62.0 in Accession #:    [id number]     Weight:       143.3 lb Date of Birth:  1940/01/28      BSA:          1.659 m Patient Age:    82 years       BP:           116/44 mmHg Patient Gender: F              HR:           100 bpm. Exam Location:  [facility] Procedure: 2D Echo, Cardiac Doppler and Color Doppler (Both Spectral and Color            Flow Doppler were utilized during procedure). Indications:     CHF-Acute Diastolic I50.31  History:          Patient has no prior history of Echocardiogram examinations.  Sonographer:     Ashley McNeely-Sloane Referring Phys:  [id number] MURVIN MANA Diagnosing Phys: Redell Cave MD IMPRESSIONS  1. Left ventricular ejection fraction, by estimation, is 60 to 65%. The left ventricle has normal function. The left ventricle has no regional wall motion abnormalities. There is mild left ventricular hypertrophy. Left ventricular diastolic parameters  are consistent with Grade I diastolic dysfunction (impaired relaxation).  2. Right ventricular systolic function is normal. The right ventricular size is normal.  3. Left atrial size was severely dilated.  4. The mitral valve is degenerative. Mild to moderate mitral valve regurgitation. Moderate to severe mitral stenosis. The mean mitral valve gradient is 10.0 mmHg.  5. The aortic valve is calcified. Aortic valve regurgitation is not visualized. Aortic valve sclerosis/calcification is present, without any evidence of aortic stenosis. Aortic valve mean gradient measures 9.0 mmHg. FINDINGS  Left Ventricle: Left ventricular ejection fraction, by estimation, is 60 to 65%. The left ventricle has normal function. The left ventricle has no regional wall motion abnormalities. The left ventricular internal cavity size was normal in size. There is  mild left ventricular hypertrophy. Left ventricular diastolic parameters are consistent with Grade I diastolic dysfunction (impaired relaxation). Right Ventricle: The right ventricular size is normal. No increase in right ventricular wall thickness. Right ventricular systolic function is normal. Left Atrium: Left atrial size was severely dilated. Right Atrium: Right atrial size was normal in size. Pericardium: There is no evidence of pericardial effusion. Mitral Valve: The mitral valve is degenerative in appearance. Mild to moderate mitral valve regurgitation. 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 MV peak gradient, 21.0 mmHg. The mean mitral  valve gradient is 10.0 mmHg. Tricuspid Valve: The tricuspid valve is normal in structure. Tricuspid valve regurgitation is mild. Aortic Valve: The aortic valve is calcified. Aortic valve regurgitation is not visualized. Aortic valve sclerosis/calcification is present, without any evidence of aortic stenosis. Aortic valve mean gradient measures 9.0 mmHg. Aortic valve peak gradient measures 16.9 mmHg. Aortic valve area, by VTI measures 2.28 cm. Pulmonic Valve: The pulmonic valve was not well visualized. Pulmonic valve regurgitation is not visualized. Aorta: The aortic root is normal in size and structure. Venous: The inferior vena cava was not well visualized. IAS/Shunts: No atrial level shunt detected by color flow Doppler.  LEFT VENTRICLE PLAX 2D LVIDd:         5.10 cm     Diastology LVIDs:         3.20 cm     LV e' medial:  3.26 cm/s LV PW:         1.00 cm     LV e' lateral: 5.97 cm/s LV IVS:        1.00 cm LVOT diam:     1.90 cm LV SV:         85 LV SV Index:   51 LVOT Area:     2.84 cm  LV Volumes (MOD) LV vol d, MOD A2C: 49.8 ml LV vol d, MOD A4C: 74.5 ml LV vol s, MOD A2C: 18.2 ml LV vol s, MOD A4C: 20.0 ml LV SV MOD A2C:     31.6 ml LV SV MOD A4C:     74.5 ml LV SV MOD BP:      43.0 ml RIGHT VENTRICLE RV S prime:     12.30 cm/s TAPSE (M-mode): 2.4 cm LEFT ATRIUM              Index        RIGHT ATRIUM           Index LA Vol (A2C):   99.9 ml  60.21 ml/m  RA Area:     16.10 cm LA Vol (A4C):   100.0 ml 60.27 ml/m  RA Volume:   43.70 ml  26.34 ml/m LA Biplane Vol: 99.9 ml  60.21 ml/m  AORTIC VALVE                     PULMONIC VALVE AV Area (Vmax):    2.18 cm      PV Vmax:        1.33 m/s AV Area (Vmean):   2.24 cm      PV Vmean:       92.700 cm/s AV Area (VTI):     2.28 cm      PV VTI:         0.247 m AV Vmax:           205.50 cm/s   PV Peak grad:   7.1 mmHg AV Vmean:          138.000 cm/s  PV Mean grad:   4.0 mmHg AV VTI:            0.373 m       RVOT Peak grad: 5 mmHg AV Peak Grad:      16.9 mmHg AV  Mean Grad:      9.0 mmHg LVOT Vmax:         158.00 cm/s LVOT Vmean:        109.000 cm/s LVOT VTI:          0.299 m LVOT/AV VTI ratio: 0.80  AORTA Ao Root diam: 3.10 cm Ao Asc diam:  3.20 cm MITRAL VALVE MV Area (PHT): 3.19 cm    SHUNTS MV Area VTI:   2.41 cm    Systemic VTI:  0.30 m MV Peak grad:  21.0 mmHg   Systemic Diam: 1.90 cm MV Mean grad:  10.0 mmHg   Pulmonic VTI:  0.184 m MV Vmax:       2.29 m/s MV Vmean:      143.0 cm/s MR Peak grad: 114.1 mmHg MR Mean grad: 82.0 mmHg MR Vmax:      534.00 cm/s MR Vmean:     434.0 cm/s Redell Cave MD Electronically signed by Redell Cave MD Signature Date/Time: 06/30/2023/5:08:03 PM    Final    DG Chest 2 View Result Date: 06/29/2023 CLINICAL DATA:  Aspiration pneumonia EXAM: CHEST - 2 VIEW COMPARISON:  Chest x-ray 06/21/2023.  CT scan chest 06/27/2023 FINDINGS: Frontal view is rotated to the right. Hyperinflation. Persistent small pleural effusions and lung base opacities. No pneumothorax or edema. Only slight prominence of central vasculature. Borderline size heart. Calcified aorta. Diffuse degenerative changes along the spine. Osteopenia. IMPRESSION: No significant interval change when adjusted for technique. Electronically Signed   By: Ranell Bring M.D.   On: 06/29/2023 16:22   CT CHEST WO CONTRAST Result Date: 06/27/2023 CLINICAL DATA:  Pneumonia. EXAM: CT CHEST WITHOUT CONTRAST TECHNIQUE: Multidetector CT imaging of the chest was performed following the standard protocol without IV contrast. RADIATION DOSE REDUCTION: This exam was performed according to the departmental dose-optimization program which includes automated exposure control, adjustment of the mA and/or kV according to patient size and/or use of iterative reconstruction technique. COMPARISON:  Chest CT dated struck by chest radiograph dated 06/21/2023. FINDINGS: Evaluation of this exam is limited in the absence of intravenous contrast. Cardiovascular: Top-normal cardiac size. No  pericardial effusion. Three-vessel coronary vascular calcification and calcification of the mitral annulus. Mild atherosclerotic calcification of the thoracic aorta. No aneurysmal dilatation. The central pulmonary arteries are grossly unremarkable. Mediastinum/Nodes: No obvious hilar adenopathy. Evaluation however is limited in the absence of intravenous contrast and due to consolidative changes of the lungs. The esophagus is grossly  unremarkable. No mediastinal fluid collection. Lungs/Pleura: Large area of consolidation involving the right lower and right middle lobes as well as areas of consolidation at the left lung base. There is occlusion of the right middle and right lower lobe bronchi likely with mucous impaction or aspirated content. An endobronchial lesion is not excluded. Several ground-glass nodules in the left lower lobe as well as left upper lobe measure up to 9 mm, likely inflammatory to pressures in etiology. Small bilateral pleural effusions. No pneumothorax. Upper Abdomen: No acute abnormality. Musculoskeletal: Osteopenia with degenerative changes. No acute osseous pathology. IMPRESSION: 1. Occlusion of the right middle and right lower lobe bronchi secondary to mucous impaction or aspirated content with large area of postobstructive atelectasis or infiltrate. Follow-up to resolution recommended to exclude underlying mass. 2. Left lung base density as well as scattered ground-glass nodules in the left lung likely inflammatory/infectious in etiology. Follow-up to resolution after treatment recommended. 3. Small bilateral pleural effusions. 4.  Aortic Atherosclerosis (ICD10-I70.0). Electronically Signed   By: Vanetta Chou M.D.   On: 06/27/2023 15:53   CT RENAL STONE STUDY Result Date: 06/22/2023 EXAM: CT UROGRAM 06/22/2023 02:48:55 AM TECHNIQUE: CT of the abdomen and pelvis was performed before and after the administration of intravenous contrast as per CT urogram protocol. Multiplanar  reformatted images as well as MIP urogram images are provided for review. Automated exposure control, iterative reconstruction, and/or weight based adjustment of the mA/kV was utilized to reduce the radiation dose to as low as reasonably achievable. COMPARISON: None available. CLINICAL HISTORY: Abdominal/flank pain, stone suspected. 83 y.o. female with medical history significant of COPD, HTN, HLD, dCHF, hypothyroidism, CKD 3B, anemia, former smoker, who presents with cough and SOB. Patient states that she has SOB and cough with greenish colored sputum production in the past 3 days. No chest pain, fever or chills. The shortness breath has been progressively worsening. Patient had 1 loose stool bowel movement this morning, currently no nausea, vomiting, abdominal pain or diarrhea. Patient reports dysuria, burning urination and left flank pain for several days. FINDINGS: LOWER CHEST: Multifocal patchy opacities, right lower lobe predominant, suspicious for pneumonia. LIVER: The liver is unremarkable. GALLBLADDER AND BILE DUCTS: Distended gallbladder without cholelithiasis. No biliary ductal dilatation. SPLEEN: No acute abnormality. PANCREAS: No acute abnormality. ADRENAL GLANDS: No acute abnormality. KIDNEYS, URETERS AND BLADDER: No stones in the kidneys or ureters. No hydronephrosis. No perinephric or periureteral stranding. Urinary bladder is unremarkable. GI AND BOWEL: Tiny hiatal hernia. Normal appendix (image 57). There is no bowel obstruction. PERITONEUM AND RETROPERITONEUM: No ascites. No free air. VASCULATURE: Atherosclerotic calcifications of the abdominal aorta and branch vessels. LYMPH NODES: No lymphadenopathy. REPRODUCTIVE ORGANS: No acute abnormality. BONES AND SOFT TISSUES: Left hip arthroplasty without evidence of complication. Mild degenerative changes of the lower lumbar spine. Grade 1 spondylolisthesis of L4 on L5. Grade 1 anterolisthesis of L5 on S1. No focal soft tissue abnormality. IMPRESSION: 1.  No acute findings in the abdomen/pelvis. 2. Multifocal patchy opacities, right lower lobe predominant, suspicious for pneumonia. Electronically signed by: Pinkie Pebbles MD 06/22/2023 02:53 AM EDT RP Workstation: [id number]   DG Chest Port 1 View Result Date: 06/21/2023 EXAM: 1 VIEW XRAY OF THE CHEST 06/21/2023 08:03:00 PM COMPARISON: CT chest dated 02/09/2004. CLINICAL HISTORY: Questionable sepsis - evaluate for abnormality. Questionable sepsis - eval for abnormality. Patient with exertional SOB x 3 days and pain with urination. Hx of COPD, HTN, and pneumonia. FINDINGS: LUNGS AND PLEURA: Small bilateral pleural effusions. Associated bilateral lower lobe opacities, atelectasis  versus pneumonia. HEART AND MEDIASTINUM: No acute abnormality of the cardiac and mediastinal silhouettes. BONES AND SOFT TISSUES: No acute osseous abnormality. IMPRESSION: 1. Small bilateral pleural effusions. 2. Bilateral lower lobe opacities, atelectasis versus pneumonia. Electronically signed by: Pinkie Pebbles MD 06/21/2023 08:06 PM EDT RP Workstation: [id number]    Microbiology: Results for orders placed or performed during the hospital encounter of 06/21/23  Blood Culture (routine x 2)     Status: None   Collection Time: 06/21/23  7:55 PM   Specimen: BLOOD  Result Value Ref Range Status   Specimen Description BLOOD BLOOD LEFT ARM  Final   Special Requests   Final    BOTTLES DRAWN AEROBIC AND ANAEROBIC Blood Culture adequate volume   Culture   Final    NO GROWTH 5 DAYS Performed at [facility], [address] [place], [place] [address]    Report Status 06/26/2023 FINAL  Final  Blood Culture (routine x 2)     Status: Abnormal   Collection Time: 06/21/23  7:55 PM   Specimen: BLOOD RIGHT ARM  Result Value Ref Range Status   Specimen Description   Final    BLOOD RIGHT ARM Performed at [facility], [address] [place], [place] [address]    Special Requests   Final    BOTTLES DRAWN AEROBIC  AND ANAEROBIC Blood Culture adequate volume Performed at [facility], [address] [place], [place] [address]    Culture  Setup Time   Final    Organism ID to follow GRAM NEGATIVE RODS ANAEROBIC BOTTLE ONLY CRITICAL RESULT CALLED TO, READ BACK BY AND VERIFIED WITH: Raquel Rodriguez guzman @1816  on 06/22/23 skl Performed at [facility], [address] [place], [place] [address]    Culture PANTOEA AGGLOMERANS (A)  Final   Report Status 06/26/2023 FINAL  Final   Organism ID, Bacteria PANTOEA AGGLOMERANS  Final      Susceptibility   Pantoea agglomerans - MIC*    CEFEPIME  <=0.12 SENSITIVE Sensitive     CEFTAZIDIME <=1 SENSITIVE Sensitive     CEFTRIAXONE  <=0.25 SENSITIVE Sensitive     CIPROFLOXACIN <=0.25 SENSITIVE Sensitive     GENTAMICIN <=1 SENSITIVE Sensitive     IMIPENEM 0.5 SENSITIVE Sensitive     TRIMETH/SULFA <=20 SENSITIVE Sensitive     PIP/TAZO >=128 RESISTANT Resistant ug/mL    * PANTOEA AGGLOMERANS  Urine Culture (for pregnant, neutropenic or urologic patients or patients with an indwelling urinary catheter)     Status: Abnormal   Collection Time: 06/21/23  7:55 PM   Specimen: Urine, Clean Catch  Result Value Ref Range Status   Specimen Description   Final    URINE, CLEAN CATCH Performed at [facility], [address], [place], [place] [address]    Special Requests   Final    NONE Performed at [facility], [address] [place], [place] [address]    Culture MULTIPLE SPECIES PRESENT, SUGGEST RECOLLECTION (A)  Final   Report Status 06/23/2023 FINAL  Final  Blood Culture ID Panel (Reflexed)     Status: Abnormal   Collection Time: 06/21/23  7:55 PM  Result Value Ref Range Status   Enterococcus faecalis NOT DETECTED NOT DETECTED Final   Enterococcus Faecium NOT DETECTED NOT DETECTED Final   Listeria monocytogenes NOT DETECTED NOT DETECTED Final   Staphylococcus species NOT DETECTED NOT DETECTED Final   Staphylococcus  aureus (BCID) NOT DETECTED NOT DETECTED Final   Staphylococcus epidermidis NOT DETECTED NOT  DETECTED Final   Staphylococcus lugdunensis NOT DETECTED NOT DETECTED Final   Streptococcus species NOT DETECTED NOT DETECTED Final   Streptococcus agalactiae NOT DETECTED NOT DETECTED Final   Streptococcus pneumoniae NOT DETECTED NOT DETECTED Final   Streptococcus pyogenes NOT DETECTED NOT DETECTED Final   A.calcoaceticus-baumannii NOT DETECTED NOT DETECTED Final   Bacteroides fragilis NOT DETECTED NOT DETECTED Final   Enterobacterales DETECTED (A) NOT DETECTED Final    Comment: Enterobacterales represent a large order of gram negative bacteria, not a single organism. Refer to culture for further identification. CRITICAL RESULT CALLED TO, READ BACK BY AND VERIFIED WITH: Raquel Evern guzman @1816  on 06/22/23 skl    Enterobacter cloacae complex NOT DETECTED NOT DETECTED Final   Escherichia coli NOT DETECTED NOT DETECTED Final   Klebsiella aerogenes NOT DETECTED NOT DETECTED Final   Klebsiella oxytoca NOT DETECTED NOT DETECTED Final   Klebsiella pneumoniae NOT DETECTED NOT DETECTED Final   Proteus species NOT DETECTED NOT DETECTED Final   Salmonella species NOT DETECTED NOT DETECTED Final   Serratia marcescens NOT DETECTED NOT DETECTED Final   Haemophilus influenzae NOT DETECTED NOT DETECTED Final   Neisseria meningitidis NOT DETECTED NOT DETECTED Final   Pseudomonas aeruginosa NOT DETECTED NOT DETECTED Final   Stenotrophomonas maltophilia NOT DETECTED NOT DETECTED Final   Candida albicans NOT DETECTED NOT DETECTED Final   Candida auris NOT DETECTED NOT DETECTED Final   Candida glabrata NOT DETECTED NOT DETECTED Final   Candida krusei NOT DETECTED NOT DETECTED Final   Candida parapsilosis NOT DETECTED NOT DETECTED Final   Candida tropicalis NOT DETECTED NOT DETECTED Final   Cryptococcus neoformans/gattii NOT DETECTED NOT DETECTED Final   CTX-M ESBL NOT DETECTED NOT DETECTED Final    Carbapenem resistance IMP NOT DETECTED NOT DETECTED Final   Carbapenem resistance KPC NOT DETECTED NOT DETECTED Final   Carbapenem resistance NDM NOT DETECTED NOT DETECTED Final   Carbapenem resist OXA 48 LIKE NOT DETECTED NOT DETECTED Final   Carbapenem resistance VIM NOT DETECTED NOT DETECTED Final    Comment: Performed at [facility], [address] [place], [place] [address]  Expectorated Sputum Assessment w Gram Stain, Rflx to Resp Cult     Status: None   Collection Time: 06/22/23 12:20 AM   Specimen: Urine, Clean Catch; Sputum  Result Value Ref Range Status   Specimen Description SPU  Final   Special Requests NONE  Final   Sputum evaluation   Final    THIS SPECIMEN IS ACCEPTABLE FOR SPUTUM CULTURE Performed at [facility], [address] [place], [place] [address]    Report Status 06/22/2023 FINAL  Final  Resp panel by RT-PCR (RSV, Flu A&B, Covid) Urine, Clean Catch     Status: None   Collection Time: 06/22/23 12:20 AM   Specimen: Urine, Clean Catch; Nasal Swab  Result Value Ref Range Status   SARS Coronavirus 2 by RT PCR NEGATIVE NEGATIVE Final    Comment: (NOTE) SARS-CoV-2 target nucleic acids are NOT DETECTED.  The SARS-CoV-2 RNA is generally detectable in upper respiratory specimens during the acute phase of infection. The lowest concentration of SARS-CoV-2 viral copies this assay can detect is 138 copies/mL. A negative result does not preclude SARS-Cov-2 infection and should not be used as the sole basis for treatment or other patient management decisions. A negative result may occur with  improper specimen collection/handling, submission of specimen other than nasopharyngeal swab, presence of viral mutation(s) within the areas targeted by this assay, and inadequate number  of viral copies(<138 copies/mL). A negative result must be combined with clinical observations, patient history, and epidemiological information. The expected result is  Negative.  Fact Sheet for Patients:  BloggerCourse.com  Fact Sheet for Healthcare Providers:  SeriousBroker.it  This test is no t yet approved or cleared by the [place]  FDA and  has been authorized for detection and/or diagnosis of SARS-CoV-2 by FDA under an Emergency Use Authorization (EUA). This EUA will remain  in effect (meaning this test can be used) for the duration of the COVID-19 declaration under Section 564(b)(1) of the Act, 21 U.S.C.section 360bbb-3(b)(1), unless the authorization is terminated  or revoked sooner.       Influenza A by PCR NEGATIVE NEGATIVE Final   Influenza B by PCR NEGATIVE NEGATIVE Final    Comment: (NOTE) The Xpert Xpress SARS-CoV-2/FLU/RSV plus assay is intended as an aid in the diagnosis of influenza from Nasopharyngeal swab specimens and should not be used as a sole basis for treatment. Nasal washings and aspirates are unacceptable for Xpert Xpress SARS-CoV-2/FLU/RSV testing.  Fact Sheet for Patients: BloggerCourse.com  Fact Sheet for Healthcare Providers: SeriousBroker.it  This test is not yet approved or cleared by the [place]  FDA and has been authorized for detection and/or diagnosis of SARS-CoV-2 by FDA under an Emergency Use Authorization (EUA). This EUA will remain in effect (meaning this test can be used) for the duration of the COVID-19 declaration under Section 564(b)(1) of the Act, 21 U.S.C. section 360bbb-3(b)(1), unless the authorization is terminated or revoked.     Resp Syncytial Virus by PCR NEGATIVE NEGATIVE Final    Comment: (NOTE) Fact Sheet for Patients: BloggerCourse.com  Fact Sheet for Healthcare Providers: SeriousBroker.it  This test is not yet approved or cleared by the [place]  FDA and has been authorized for detection and/or diagnosis of  SARS-CoV-2 by FDA under an Emergency Use Authorization (EUA). This EUA will remain in effect (meaning this test can be used) for the duration of the COVID-19 declaration under Section 564(b)(1) of the Act, 21 U.S.C. section 360bbb-3(b)(1), unless the authorization is terminated or revoked.  Performed at [facility], [address] [place], [place] [address]   Culture, Respiratory w Gram Stain     Status: None   Collection Time: 06/22/23 12:20 AM   Specimen: Sputum  Result Value Ref Range Status   Specimen Description   Final    SPU Performed at [facility], [address] [place], [place] [address]    Special Requests   Final    NONE Reflexed from [id number] Performed at [facility], [address] [place], [place] [address]    Gram Stain   Final    ABUNDANT WBC PRESENT, PREDOMINANTLY PMN ABUNDANT SQUAMOUS EPITHELIAL CELLS PRESENT ABUNDANT GRAM POSITIVE COCCI ABUNDANT GRAM POSITIVE RODS MODERATE GRAM NEGATIVE RODS Performed at [facility], [address] [place], [place] [address]    Culture   Final    ABUNDANT PSEUDOMONAS AERUGINOSA ABUNDANT SERRATIA LIQUEFACIENS Two isolates with different morphologies were identified as the same organism.The most resistant organism was reported. FOR PSEUDOMONAS AERUGINOSA ABUNDANT STAPHYLOCOCCUS LUGDUNENSIS    Report Status 06/26/2023 FINAL  Final   Organism ID, Bacteria PSEUDOMONAS AERUGINOSA  Final   Organism ID, Bacteria SERRATIA LIQUEFACIENS  Final   Organism ID, Bacteria STAPHYLOCOCCUS LUGDUNENSIS  Final      Susceptibility   Pseudomonas aeruginosa - MIC*    CEFTAZIDIME <=1 SENSITIVE Sensitive     CIPROFLOXACIN <=0.25 SENSITIVE Sensitive  GENTAMICIN 2 SENSITIVE Sensitive     IMIPENEM 1 SENSITIVE Sensitive     PIP/TAZO <=4 SENSITIVE Sensitive ug/mL    CEFEPIME  2 SENSITIVE Sensitive     * ABUNDANT PSEUDOMONAS AERUGINOSA   Serratia liquefaciens - MIC*    CEFEPIME  <=0.12 SENSITIVE  Sensitive     CEFTAZIDIME <=1 SENSITIVE Sensitive     CEFTRIAXONE  <=0.25 SENSITIVE Sensitive     CIPROFLOXACIN <=0.25 SENSITIVE Sensitive     GENTAMICIN <=1 SENSITIVE Sensitive     IMIPENEM 1 SENSITIVE Sensitive     TRIMETH/SULFA <=20 SENSITIVE Sensitive     PIP/TAZO <=4 SENSITIVE Sensitive ug/mL    * ABUNDANT SERRATIA LIQUEFACIENS   Staphylococcus lugdunensis - MIC*    CIPROFLOXACIN <=0.5 SENSITIVE Sensitive     ERYTHROMYCIN <=0.25 SENSITIVE Sensitive     GENTAMICIN <=0.5 SENSITIVE Sensitive     OXACILLIN 2 SENSITIVE Sensitive     TETRACYCLINE <=1 SENSITIVE Sensitive     VANCOMYCIN <=0.5 SENSITIVE Sensitive     TRIMETH/SULFA <=10 SENSITIVE Sensitive     CLINDAMYCIN <=0.25 SENSITIVE Sensitive     RIFAMPIN <=0.5 SENSITIVE Sensitive     Inducible Clindamycin NEGATIVE Sensitive     * ABUNDANT STAPHYLOCOCCUS LUGDUNENSIS    Labs: CBC: Recent Labs  Lab 06/28/23 0509 06/29/23 0302 07/01/23 0542  WBC 22.0* 21.7* 20.8*  HGB 8.1* 8.0* 8.4*  HCT 25.1* 25.2* 26.1*  MCV 92.6 92.3 93.5  PLT 436* 445* 423*   Basic Metabolic Panel: Recent Labs  Lab 06/29/23 0302 07/01/23 0542 07/02/23 0438 07/03/23 0353 07/04/23 0454  NA 138 142 139 136 136  K 3.8 3.5 3.8 3.7 3.5  CL 110 115* 112* 108 105  CO2 20* 19* 20* 20* 23  GLUCOSE 83 80 107* 71 78  BUN 39* 40* 44* 40* 35*  CREATININE 1.37* 1.48* 1.67* 1.70* 1.53*  CALCIUM  10.0 10.4* 10.7* 10.1 10.0  MG 1.4* 2.4 2.2  --   --   PHOS  --  3.0  --   --   --    Liver Function Tests: No results for input(s): AST, ALT, ALKPHOS, BILITOT, PROT, ALBUMIN  in the last 168 hours. CBG: No results for input(s): GLUCAP in the last 168 hours.  Discharge time spent: 35 minutes  Signed: Murvin Mana, MD [facility] 07/04/2023

## 2023-07-04 NOTE — Progress Notes (Signed)
 PULMONOLOGY         Date: 07/04/2023,   MRN# [id number] Eileen Wright 04-Mar-1940     AdmissionWeight: 66.7 kg                 CurrentWeight: 74.6 kg  Referring provider: Dr Laurita   CHIEF COMPLAINT:   Mucus plugging of tracheobronchial tree and productive cough   HISTORY OF PRESENT ILLNESS    This is a pleasant 83 year old female with a history of chronic anemia, osteoarthritis, cardia myopathy, GERD, dyslipidemia, essential hypertension, hypothyroidism, mitral valve prolapse, recurrent pneumonia with a history of COPD as well as diastolic CHF and CKD 3B who came in with complaints of worsening cough and shortness of breath complaining of expectoration of dark thickened inspissated phlegm which is discolored and heavy with a greenish hue. She also reports dysuria and burning upon urination with left flank pain for 1 week prior to admission.  She was admitted to the emergency department for possible UTI with notable abnormal labs including leukocytosis and urinalysis showing many bacteria with leukocyte esterase positive consistent with urinary tract infection.  She also had worsening renal impairment on admission with acute on chronic renal insufficiency as well as abnormal chest x-ray with bilateral lung opacities worse at the right lower lobe.  CT chest was performed with finding of a consolidated infiltrate and debris endobronchially with severe mucous plugging.  PCCM consultation was placed for additional evaluation management.   07/03/23- patient seen at bedside this morning.  She is reporting clinical improvement.  She is agreeable to trying PT/OT today and would like to get OOB to chair.  She sat in chair yesterday for couple hours felt well.  She lives with daughter at home. She has worsening AKI on BMP this am.  I have dcd her BID protonix  as this can at times contribute to renal impairment.  She does have CKD. Overall her pulmonary process is improved and she can begin DC  planning stages of hospitalization. I anticipate renal function improved by tommorow.  Will repeat CXR this am.   07/04/23- patient close to baseline and will be seen on outpatient basis.     PAST MEDICAL HISTORY   Past Medical History:  Diagnosis Date   Anemia    Arthritis    Cardiomyopathy (HCC)    COPD (chronic obstructive pulmonary disease) (HCC)    GERD (gastroesophageal reflux disease)    Heart murmur    Hyperlipemia    Hypertension    Hypothyroidism    Mitral valve insufficiency    Pneumonia      SURGICAL HISTORY   Past Surgical History:  Procedure Laterality Date   APPENDECTOMY     BREAST BIOPSY     TONSILLECTOMY     adenoids   TOTAL HIP ARTHROPLASTY Left 12/03/2020   Procedure: TOTAL HIP ARTHROPLASTY ANTERIOR APPROACH;  Surgeon: Fidel Rogue, MD;  Location: [facility];  Service: Orthopedics;  Laterality: Left;     FAMILY HISTORY   Family History  Problem Relation Age of Onset   Heart failure Mother    Throat cancer Father    Bladder Cancer Sister      SOCIAL HISTORY   Social History   Tobacco Use   Smoking status: Former    Types: Cigarettes   Smokeless tobacco: Never  Vaping Use   Vaping status: Never Used  Substance Use Topics   Alcohol  use: Never   Drug use: Never     MEDICATIONS  Home Medication:    Current Medication:  Current Facility-Administered Medications:    acetaminophen  (TYLENOL ) tablet 650 mg, 650 mg, Oral, Q6H PRN, Niu, Xilin, MD   acetylcysteine  (MUCOMYST ) 20 % nebulizer / oral solution 3 mL, 3 mL, Nebulization, BID, Zhang, Dekui, MD, 3 mL at 07/04/23 9286   albuterol  (PROVENTIL ) (2.5 MG/3ML) 0.083% nebulizer solution 2.5 mg, 2.5 mg, Inhalation, Q4H PRN, Niu, Xilin, MD   atorvastatin  (LIPITOR) tablet 10 mg, 10 mg, Oral, QHS, Niu, Xilin, MD, 10 mg at 07/03/23 2248   budesonide -glycopyrrolate -formoterol  (BREZTRI ) 160-9-4.8 MCG/ACT inhaler 2 puff, 2 puff, Inhalation, BID, Niu, Xilin, MD, 2 puff at 07/03/23 2255    cholecalciferol  (VITAMIN D3) 25 MCG (1000 UNIT) tablet 5,000 Units, 5,000 Units, Oral, Daily, Niu, Xilin, MD, 5,000 Units at 07/03/23 9164   ciprofloxacin  (CIPRO ) tablet 750 mg, 750 mg, Oral, Q breakfast, Epifanio Alm SQUIBB, MD, 750 mg at 07/03/23 9166   dextromethorphan-guaiFENesin  (MUCINEX  DM) 30-600 MG per 12 hr tablet 1 tablet, 1 tablet, Oral, BID PRN, Niu, Xilin, MD   heparin  injection 5,000 Units, 5,000 Units, Subcutaneous, Q8H, Niu, Xilin, MD, 5,000 Units at 07/04/23 0636   hydrALAZINE  (APRESOLINE ) injection 5 mg, 5 mg, Intravenous, Q2H PRN, Niu, Xilin, MD   ipratropium-albuterol  (DUONEB) 0.5-2.5 (3) MG/3ML nebulizer solution 3 mL, 3 mL, Nebulization, BID, Zhang, Dekui, MD, 3 mL at 07/04/23 9287   levothyroxine  (SYNTHROID ) tablet 100 mcg, 100 mcg, Oral, Q0600, Niu, Xilin, MD, 100 mcg at 07/04/23 9362   loratadine  (CLARITIN ) tablet 10 mg, 10 mg, Oral, Daily, Niu, Xilin, MD, 10 mg at 07/03/23 9165   melatonin tablet 5 mg, 5 mg, Oral, QHS, Zhang, Dekui, MD, 5 mg at 07/03/23 2248   metoprolol  succinate (TOPROL -XL) 24 hr tablet 12.5 mg, 12.5 mg, Oral, Daily, Elpidio Reyes DEL, MD, 12.5 mg at 07/03/23 9165   metroNIDAZOLE  (FLAGYL ) tablet 500 mg, 500 mg, Oral, Q12H, Epifanio Alm SQUIBB, MD, 500 mg at 07/03/23 2248   ondansetron  (ZOFRAN ) injection 4 mg, 4 mg, Intravenous, Q8H PRN, Niu, Xilin, MD   polyethylene glycol (MIRALAX  / GLYCOLAX ) packet 17 g, 17 g, Oral, Daily, Laurita Pillion, MD, 17 g at 07/03/23 1050   potassium chloride  SA (KLOR-CON  M) CR tablet 40 mEq, 40 mEq, Oral, Once, Laurita Pillion, MD   predniSONE  (DELTASONE ) tablet 30 mg, 30 mg, Oral, Q breakfast, Dolorez Jeffrey, MD, 30 mg at 07/03/23 9166   senna-docusate (Senokot-S) tablet 2 tablet, 2 tablet, Oral, BID, Laurita Pillion, MD, 2 tablet at 07/03/23 2248   sodium bicarbonate  tablet 1,300 mg, 1,300 mg, Oral, BID, Amin, Sumayya, MD, 1,300 mg at 07/03/23 2324    ALLERGIES   Epinephrine , Vancomycin, and Penicillins     REVIEW OF  SYSTEMS    Review of Systems:  Gen:  Denies  fever, sweats, chills weigh loss  HEENT: Denies blurred vision, double vision, ear pain, eye pain, hearing loss, nose bleeds, sore throat Cardiac:  No dizziness, chest pain or heaviness, chest tightness,edema Resp:   reports dyspnea chronically  Gi: Denies swallowing difficulty, stomach pain, nausea or vomiting, diarrhea, constipation, bowel incontinence Gu:  Denies bladder incontinence, burning urine Ext:   Denies Joint pain, stiffness or swelling Skin: Denies  skin rash, easy bruising or bleeding or hives Endoc:  Denies polyuria, polydipsia , polyphagia or weight change Psych:   Denies depression, insomnia or hallucinations   Other:  All other systems negative   VS: BP (!) 119/59 (BP Location: Left Arm)   Pulse 79   Temp 97.6 F (36.4 C)  Resp 16   Ht 5' 2 (1.575 m)   Wt 74.6 kg   SpO2 100%   BMI 30.08 kg/m      PHYSICAL EXAM    GENERAL:NAD, no fevers, chills, no weakness no fatigue HEAD: Normocephalic, atraumatic.  EYES: Pupils equal, round, reactive to light. Extraocular muscles intact. No scleral icterus.  MOUTH: Moist mucosal membrane. Dentition intact. No abscess noted.  EAR, NOSE, THROAT: Clear without exudates. No external lesions.  NECK: Supple. No thyromegaly. No nodules. No JVD.  PULMONARY: decreased breath sounds with mild rhonchi worse at bases bilaterally.  CARDIOVASCULAR: S1 and S2. Regular rate and rhythm. No murmurs, rubs, or gallops. No edema. Pedal pulses 2+ bilaterally.  GASTROINTESTINAL: Soft, nontender, nondistended. No masses. Positive bowel sounds. No hepatosplenomegaly.  MUSCULOSKELETAL: No swelling, clubbing, or edema. Range of motion full in all extremities.  NEUROLOGIC: Cranial nerves II through XII are intact. No gross focal neurological deficits. Sensation intact. Reflexes intact.  SKIN: No ulceration, lesions, rashes, or cyanosis. Skin warm and dry. Turgor intact.  PSYCHIATRIC: Mood, affect  within normal limits. The patient is awake, alert and oriented x 3. Insight, judgment intact.       IMAGING    Narrative & Impression  CLINICAL DATA:  Pneumonia.   EXAM: CT CHEST WITHOUT CONTRAST   TECHNIQUE: Multidetector CT imaging of the chest was performed following the standard protocol without IV contrast.   RADIATION DOSE REDUCTION: This exam was performed according to the departmental dose-optimization program which includes automated exposure control, adjustment of the mA and/or kV according to patient size and/or use of iterative reconstruction technique.   COMPARISON:  Chest CT dated struck by chest radiograph dated 06/21/2023.   FINDINGS: Evaluation of this exam is limited in the absence of intravenous contrast.   Cardiovascular: Top-normal cardiac size. No pericardial effusion. Three-vessel coronary vascular calcification and calcification of the mitral annulus. Mild atherosclerotic calcification of the thoracic aorta. No aneurysmal dilatation. The central pulmonary arteries are grossly unremarkable.   Mediastinum/Nodes: No obvious hilar adenopathy. Evaluation however is limited in the absence of intravenous contrast and due to consolidative changes of the lungs. The esophagus is grossly unremarkable. No mediastinal fluid collection.   Lungs/Pleura: Large area of consolidation involving the right lower and right middle lobes as well as areas of consolidation at the left lung base. There is occlusion of the right middle and right lower lobe bronchi likely with mucous impaction or aspirated content. An endobronchial lesion is not excluded. Several ground-glass nodules in the left lower lobe as well as left upper lobe measure up to 9 mm, likely inflammatory to pressures in etiology. Small bilateral pleural effusions. No pneumothorax.   Upper Abdomen: No acute abnormality.   Musculoskeletal: Osteopenia with degenerative changes. No acute osseous  pathology.   IMPRESSION: 1. Occlusion of the right middle and right lower lobe bronchi secondary to mucous impaction or aspirated content with large area of postobstructive atelectasis or infiltrate. Follow-up to resolution recommended to exclude underlying mass. 2. Left lung base density as well as scattered ground-glass nodules in the left lung likely inflammatory/infectious in etiology. Follow-up to resolution after treatment recommended. 3. Small bilateral pleural effusions. 4.  Aortic Atherosclerosis (ICD10-I70.0).     Electronically Signed   By: Vanetta Chou M.D.   On: 06/27/2023 15:53   ASSESSMENT/PLAN    Acute exacerbation of COPD    - currently on Breztri  BID as well as albuterol      - Prednisone  40mg  PO daily     -  Duoneb q6h    - Mucinex      - Flagyl  500 q12h and Cipro  750 daily     -please encourage incentive spirometry   Right lower lobe mucus plugging      - Patient using mucomyst  currently doing well     - may need to re-image and decide regarding bronchoscopy     - Pseudomonas resp cultures +   Acute on chronic renal insuficiency     Stage 3B renal failure    UTI SEPSIS - PRESENT ON ADMISSION          Thank you for allowing me to participate in the care of this patient.   Patient/Family are satisfied with care plan and all questions have been answered.    Provider disclosure: Patient with at least one acute or chronic illness or injury that poses a threat to life or bodily function and is being managed actively during this encounter.  All of the below services have been performed independently by signing provider:  review of prior documentation from internal and or external health records.  Review of previous and current lab results.  Interview and comprehensive assessment during patient visit today. Review of current and previous chest radiographs/CT scans. Discussion of management and test interpretation with health care team and  patient/family.   This document was prepared using Dragon voice recognition software and may include unintentional dictation errors.     Aniaya Bacha, M.D.  Division of Pulmonary & Critical Care Medicine

## 2023-07-04 NOTE — Progress Notes (Signed)
 Physical Therapy Treatment Patient Details Name: Eileen Wright MRN: [id number] DOB: May 31, 1940 Today's Date: 07/04/2023   History of Present Illness Pt is an 83 yo female that presented the ED for SOB, sputum.  workup for pneumonia and acute pyelonephritis, potential UTI. PMH of COPD, HTN, HLD, dCHF, hypothyroidism, CKD 3B, anemia, former smoker.    PT Comments  Pt ready to get up.  No assist for bed mobility with bed features.  She stands and walks to door and back x 2 with seated rest, RW and supervision.  Opts to remain up in chair after session.  Pt does endorse general fatigue and deconditioning but feels comfortable with discharge home.   If plan is discharge home, recommend the following: A little help with walking and/or transfers;A little help with bathing/dressing/bathroom;Assistance with cooking/housework;Assist for transportation;Help with stairs or ramp for entrance   Can travel by private vehicle        Equipment Recommendations       Recommendations for Other Services       Precautions / Restrictions Precautions Precautions: Fall Recall of Precautions/Restrictions: Intact Restrictions Weight Bearing Restrictions Per Provider Order: No     Mobility  Bed Mobility Overal bed mobility: Needs Assistance Bed Mobility: Supine to Sit     Supine to sit: Modified independent (Device/Increase time)       Patient Response: Cooperative  Transfers Overall transfer level: Needs assistance Equipment used: Rolling walker (2 wheels) Transfers: Sit to/from Stand Sit to Stand: Supervision, Contact guard assist                Ambulation/Gait Ambulation/Gait assistance: Supervision Gait Distance (Feet): 40 Feet Assistive device: Rolling walker (2 wheels) Gait Pattern/deviations: Step-through pattern Gait velocity: decreased     General Gait Details: 40' x 2 with RW and supervision   Stairs             Wheelchair Mobility     Tilt Bed Tilt  Bed Patient Response: Cooperative  Modified Rankin (Stroke Patients Only)       Balance Overall balance assessment: Needs assistance Sitting-balance support: Feet supported Sitting balance-Leahy Scale: Good     Standing balance support: During functional activity, Reliant on assistive device for balance, Bilateral upper extremity supported Standing balance-Leahy Scale: Good Standing balance comment: reliant on RW but is at baseline                            Communication Communication Communication: No apparent difficulties Factors Affecting Communication: Hearing impaired  Cognition Arousal: Alert Behavior During Therapy: WFL for tasks assessed/performed   PT - Cognitive impairments: No apparent impairments                         Following commands: Intact      Cueing Cueing Techniques: Verbal cues, Tactile cues  Exercises      General Comments        Pertinent Vitals/Pain Pain Assessment Pain Assessment: No/denies pain Pain Intervention(s): Monitored during session    Home Living                          Prior Function            PT Goals (current goals can now be found in the care plan section) Progress towards PT goals: Progressing toward goals    Frequency    Min 2X/week  PT Plan      Co-evaluation              AM-PAC PT 6 Clicks Mobility   Outcome Measure  Help needed turning from your back to your side while in a flat bed without using bedrails?: None Help needed moving from lying on your back to sitting on the side of a flat bed without using bedrails?: None Help needed moving to and from a bed to a chair (including a wheelchair)?: None Help needed standing up from a chair using your arms (e.g., wheelchair or bedside chair)?: None Help needed to walk in hospital room?: None Help needed climbing 3-5 steps with a railing? : A Little 6 Click Score: 23    End of Session Equipment Utilized  During Treatment: Gait belt Activity Tolerance: Patient tolerated treatment well;Patient limited by fatigue;Other (comment) Patient left: in chair;with call bell/phone within reach;with chair alarm set Nurse Communication: Mobility status PT Visit Diagnosis: Other abnormalities of gait and mobility (R26.89);Difficulty in walking, not elsewhere classified (R26.2);Muscle weakness (generalized) (M62.81)     Time: 9161-9149 PT Time Calculation (min) (ACUTE ONLY): 12 min  Charges:    $Gait Training: 8-22 mins PT General Charges $$ ACUTE PT VISIT: 1 Visit                   Lauraine Gills, PTA 07/04/23, 9:59 AM

## 2023-07-04 NOTE — Plan of Care (Signed)
  Problem: Education: Goal: Knowledge of General Education information will improve Description: Including pain rating scale, medication(s)/side effects and non-pharmacologic comfort measures Outcome: Progressing   Problem: Health Behavior/Discharge Planning: Goal: Ability to manage health-related needs will improve Outcome: Progressing   Problem: Clinical Measurements: Goal: Respiratory complications will improve Outcome: Progressing   Problem: Activity: Goal: Risk for activity intolerance will decrease Outcome: Progressing   Problem: Nutrition: Goal: Adequate nutrition will be maintained Outcome: Progressing   Problem: Elimination: Goal: Will not experience complications related to bowel motility Outcome: Progressing Goal: Will not experience complications related to urinary retention Outcome: Progressing   Problem: Pain Managment: Goal: General experience of comfort will improve and/or be controlled Outcome: Progressing   Problem: Safety: Goal: Ability to remain free from injury will improve Outcome: Progressing   Problem: Activity: Goal: Capacity to carry out activities will improve Outcome: Progressing

## 2023-08-24 ENCOUNTER — Ambulatory Visit: Payer: Self-pay | Admitting: Podiatry

## 2023-08-26 ENCOUNTER — Encounter: Payer: Self-pay | Admitting: Urgent Care

## 2023-08-28 ENCOUNTER — Ambulatory Visit (INDEPENDENT_AMBULATORY_CARE_PROVIDER_SITE_OTHER): Payer: Self-pay | Admitting: Podiatry

## 2023-08-28 DIAGNOSIS — Z91198 Patient's noncompliance with other medical treatment and regimen for other reason: Secondary | ICD-10-CM

## 2023-08-28 NOTE — Progress Notes (Signed)
 1. Failure to attend appointment with reason given    Appointment canceled by patient.

## 2023-08-30 ENCOUNTER — Other Ambulatory Visit: Payer: Self-pay | Admitting: Pulmonary Disease

## 2023-08-30 DIAGNOSIS — R0600 Dyspnea, unspecified: Secondary | ICD-10-CM

## 2023-08-30 DIAGNOSIS — J449 Chronic obstructive pulmonary disease, unspecified: Secondary | ICD-10-CM

## 2023-08-30 DIAGNOSIS — T17500A Unspecified foreign body in bronchus causing asphyxiation, initial encounter: Secondary | ICD-10-CM

## 2023-09-01 ENCOUNTER — Inpatient Hospital Stay: Admission: RE | Admit: 2023-09-01 | Source: Ambulatory Visit

## 2023-09-08 ENCOUNTER — Ambulatory Visit: Admit: 2023-09-08 | Admitting: Pulmonary Disease

## 2023-09-08 SURGERY — BRONCHOSCOPY, FLEXIBLE
Anesthesia: General

## 2023-09-14 ENCOUNTER — Ambulatory Visit
Admission: RE | Admit: 2023-09-14 | Discharge: 2023-09-14 | Disposition: A | Discharge status: Home / Self Care | Source: Ambulatory Visit | Attending: Pulmonary Disease | Admitting: Pulmonary Disease

## 2023-09-14 DIAGNOSIS — R0600 Dyspnea, unspecified: Secondary | ICD-10-CM | POA: Insufficient documentation

## 2023-09-14 DIAGNOSIS — T17500A Unspecified foreign body in bronchus causing asphyxiation, initial encounter: Secondary | ICD-10-CM | POA: Diagnosis present

## 2023-09-14 DIAGNOSIS — J449 Chronic obstructive pulmonary disease, unspecified: Secondary | ICD-10-CM | POA: Insufficient documentation

## 2023-09-14 DIAGNOSIS — R0689 Other abnormalities of breathing: Secondary | ICD-10-CM | POA: Diagnosis present
